# Patient Record
Sex: Female | Born: 1955 | Race: White | Hispanic: No | Marital: Married | Smoking: Former smoker
Health system: Southern US, Community
[De-identification: ages and names within clinical notes are randomized; demographics above are authoritative.]

## PROBLEM LIST (undated history)

## (undated) DIAGNOSIS — K435 Parastomal hernia without obstruction or  gangrene: Secondary | ICD-10-CM

## (undated) DIAGNOSIS — M8580 Other specified disorders of bone density and structure, unspecified site: Secondary | ICD-10-CM

## (undated) DIAGNOSIS — C20 Malignant neoplasm of rectum: Secondary | ICD-10-CM

## (undated) DIAGNOSIS — T2121XA Burn of second degree of chest wall, initial encounter: Secondary | ICD-10-CM

## (undated) DIAGNOSIS — N823 Fistula of vagina to large intestine: Secondary | ICD-10-CM

## (undated) HISTORY — DX: Fistula of vagina to large intestine: N82.3

## (undated) HISTORY — DX: Burn of second degree of chest wall, initial encounter: T21.21XA

## (undated) HISTORY — PX: TUBAL LIGATION: SHX77

## (undated) HISTORY — DX: Malignant neoplasm of rectum: C20

## (undated) HISTORY — DX: Other specified disorders of bone density and structure, unspecified site: M85.80

## (undated) HISTORY — PX: RECTOVAGINAL FISTULA CLOSURE: SUR265

## (undated) HISTORY — DX: Parastomal hernia without obstruction or gangrene: K43.5

---
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
## 1999-07-08 ENCOUNTER — Encounter: Admission: RE | Admit: 1999-07-08 | Discharge: 1999-07-08 | Payer: Self-pay | Admitting: Family Medicine

## 1999-07-08 ENCOUNTER — Encounter: Payer: Self-pay | Admitting: Family Medicine

## 2000-07-16 ENCOUNTER — Encounter: Admission: RE | Admit: 2000-07-16 | Discharge: 2000-07-16 | Payer: Self-pay | Admitting: Family Medicine

## 2000-07-16 ENCOUNTER — Encounter: Payer: Self-pay | Admitting: Family Medicine

## 2001-07-19 ENCOUNTER — Encounter: Admission: RE | Admit: 2001-07-19 | Discharge: 2001-07-19 | Payer: Self-pay | Admitting: Family Medicine

## 2001-07-19 ENCOUNTER — Encounter: Payer: Self-pay | Admitting: Family Medicine

## 2002-07-29 ENCOUNTER — Encounter: Payer: Self-pay | Admitting: Family Medicine

## 2002-07-29 ENCOUNTER — Encounter: Admission: RE | Admit: 2002-07-29 | Discharge: 2002-07-29 | Payer: Self-pay | Admitting: Family Medicine

## 2003-08-17 ENCOUNTER — Encounter: Admission: RE | Admit: 2003-08-17 | Discharge: 2003-08-17 | Payer: Self-pay | Admitting: Family Medicine

## 2004-09-05 ENCOUNTER — Encounter: Admission: RE | Admit: 2004-09-05 | Discharge: 2004-09-05 | Payer: Self-pay | Admitting: Family Medicine

## 2005-09-10 ENCOUNTER — Encounter: Admission: RE | Admit: 2005-09-10 | Discharge: 2005-09-10 | Payer: Self-pay | Admitting: Family Medicine

## 2006-09-14 ENCOUNTER — Encounter: Admission: RE | Admit: 2006-09-14 | Discharge: 2006-09-14 | Payer: Self-pay | Admitting: Family Medicine

## 2006-09-29 ENCOUNTER — Encounter: Admission: RE | Admit: 2006-09-29 | Discharge: 2006-09-29 | Payer: Self-pay | Admitting: Family Medicine

## 2006-11-02 ENCOUNTER — Other Ambulatory Visit: Admission: RE | Admit: 2006-11-02 | Discharge: 2006-11-02 | Payer: Self-pay | Admitting: Family Medicine

## 2007-10-05 ENCOUNTER — Encounter: Admission: RE | Admit: 2007-10-05 | Discharge: 2007-10-05 | Payer: Self-pay | Admitting: Family Medicine

## 2007-11-08 ENCOUNTER — Other Ambulatory Visit: Admission: RE | Admit: 2007-11-08 | Discharge: 2007-11-08 | Payer: Self-pay | Admitting: Family Medicine

## 2008-07-28 HISTORY — PX: COLON RESECTION: SHX5231

## 2008-10-16 ENCOUNTER — Encounter: Admission: RE | Admit: 2008-10-16 | Discharge: 2008-10-16 | Payer: Self-pay | Admitting: Family Medicine

## 2009-02-01 ENCOUNTER — Other Ambulatory Visit: Admission: RE | Admit: 2009-02-01 | Discharge: 2009-02-01 | Payer: Self-pay | Admitting: Family Medicine

## 2009-02-16 ENCOUNTER — Encounter: Admission: RE | Admit: 2009-02-16 | Discharge: 2009-02-16 | Payer: Self-pay | Admitting: Family Medicine

## 2009-03-28 ENCOUNTER — Ambulatory Visit: Payer: Self-pay | Admitting: Internal Medicine

## 2009-04-09 ENCOUNTER — Ambulatory Visit: Payer: Self-pay | Admitting: Internal Medicine

## 2009-04-09 ENCOUNTER — Encounter: Payer: Self-pay | Admitting: Internal Medicine

## 2009-04-09 HISTORY — PX: COLONOSCOPY W/ BIOPSIES AND POLYPECTOMY: SHX1376

## 2009-04-13 ENCOUNTER — Ambulatory Visit: Payer: Self-pay | Admitting: Internal Medicine

## 2009-04-13 DIAGNOSIS — C20 Malignant neoplasm of rectum: Secondary | ICD-10-CM

## 2009-04-13 HISTORY — DX: Malignant neoplasm of rectum: C20

## 2009-04-13 LAB — CONVERTED CEMR LAB
ALT: 18 units/L (ref 0–35)
AST: 23 units/L (ref 0–37)
Albumin: 4.2 g/dL (ref 3.5–5.2)
Alkaline Phosphatase: 75 units/L (ref 39–117)
Basophils Absolute: 0.2 10*3/uL — ABNORMAL HIGH (ref 0.0–0.1)
Calcium: 9.4 mg/dL (ref 8.4–10.5)
Chloride: 107 meq/L (ref 96–112)
Eosinophils Absolute: 0.2 10*3/uL (ref 0.0–0.7)
GFR calc non Af Amer: 93.71 mL/min (ref >60)
HCT: 42.4 % (ref 36.0–46.0)
MCHC: 33.6 g/dL (ref 30.0–36.0)
Monocytes Relative: 7.1 % (ref 3.0–12.0)
Neutro Abs: 3.1 10*3/uL (ref 1.4–7.7)
Platelets: 243 10*3/uL (ref 150.0–400.0)
Sodium: 143 meq/L (ref 135–145)

## 2009-04-23 ENCOUNTER — Ambulatory Visit: Payer: Self-pay | Admitting: Cardiology

## 2009-04-25 ENCOUNTER — Encounter: Payer: Self-pay | Admitting: Internal Medicine

## 2009-04-27 ENCOUNTER — Encounter: Payer: Self-pay | Admitting: Gastroenterology

## 2009-04-27 ENCOUNTER — Telehealth (INDEPENDENT_AMBULATORY_CARE_PROVIDER_SITE_OTHER): Payer: Self-pay | Admitting: *Deleted

## 2009-05-03 ENCOUNTER — Ambulatory Visit: Payer: Self-pay | Admitting: Gastroenterology

## 2009-05-03 ENCOUNTER — Ambulatory Visit (HOSPITAL_COMMUNITY): Admission: RE | Admit: 2009-05-03 | Discharge: 2009-05-03 | Payer: Self-pay | Admitting: Gastroenterology

## 2009-06-11 ENCOUNTER — Inpatient Hospital Stay (HOSPITAL_COMMUNITY): Admission: RE | Admit: 2009-06-11 | Discharge: 2009-06-17 | Payer: Self-pay | Admitting: Surgery

## 2009-06-11 ENCOUNTER — Encounter (INDEPENDENT_AMBULATORY_CARE_PROVIDER_SITE_OTHER): Payer: Self-pay | Admitting: Surgery

## 2009-06-20 ENCOUNTER — Encounter: Payer: Self-pay | Admitting: Internal Medicine

## 2009-07-11 ENCOUNTER — Encounter: Payer: Self-pay | Admitting: Internal Medicine

## 2009-07-28 DIAGNOSIS — Z87442 Personal history of urinary calculi: Secondary | ICD-10-CM

## 2009-07-28 HISTORY — DX: Personal history of urinary calculi: Z87.442

## 2009-07-28 HISTORY — PX: OTHER SURGICAL HISTORY: SHX169

## 2009-08-10 ENCOUNTER — Inpatient Hospital Stay (HOSPITAL_COMMUNITY): Admission: RE | Admit: 2009-08-10 | Discharge: 2009-08-13 | Payer: Self-pay | Admitting: Surgery

## 2009-08-28 ENCOUNTER — Encounter: Payer: Self-pay | Admitting: Internal Medicine

## 2009-09-07 ENCOUNTER — Inpatient Hospital Stay (HOSPITAL_COMMUNITY): Admission: RE | Admit: 2009-09-07 | Discharge: 2009-09-12 | Payer: Self-pay | Admitting: Surgery

## 2009-09-19 ENCOUNTER — Encounter: Payer: Self-pay | Admitting: Internal Medicine

## 2009-10-11 ENCOUNTER — Encounter: Payer: Self-pay | Admitting: Internal Medicine

## 2009-10-22 ENCOUNTER — Encounter: Payer: Self-pay | Admitting: Internal Medicine

## 2009-11-07 ENCOUNTER — Encounter: Payer: Self-pay | Admitting: Internal Medicine

## 2009-11-12 ENCOUNTER — Encounter: Admission: RE | Admit: 2009-11-12 | Discharge: 2009-11-12 | Payer: Self-pay | Admitting: Family Medicine

## 2009-11-28 ENCOUNTER — Encounter: Payer: Self-pay | Admitting: Internal Medicine

## 2009-12-26 ENCOUNTER — Encounter: Payer: Self-pay | Admitting: Internal Medicine

## 2010-01-07 ENCOUNTER — Encounter: Payer: Self-pay | Admitting: Internal Medicine

## 2010-01-07 ENCOUNTER — Encounter: Admission: RE | Admit: 2010-01-07 | Discharge: 2010-01-07 | Payer: Self-pay | Admitting: Dermatology

## 2010-01-23 ENCOUNTER — Encounter: Payer: Self-pay | Admitting: Internal Medicine

## 2010-02-06 ENCOUNTER — Encounter: Payer: Self-pay | Admitting: Internal Medicine

## 2010-02-23 ENCOUNTER — Inpatient Hospital Stay (HOSPITAL_COMMUNITY): Admission: AD | Admit: 2010-02-23 | Discharge: 2010-02-24 | Payer: Self-pay | Admitting: Surgery

## 2010-03-08 ENCOUNTER — Encounter: Payer: Self-pay | Admitting: Internal Medicine

## 2010-03-10 ENCOUNTER — Telehealth: Payer: Self-pay | Admitting: Internal Medicine

## 2010-03-11 ENCOUNTER — Encounter (INDEPENDENT_AMBULATORY_CARE_PROVIDER_SITE_OTHER): Payer: Self-pay | Admitting: *Deleted

## 2010-03-25 ENCOUNTER — Encounter: Payer: Self-pay | Admitting: Internal Medicine

## 2010-04-10 ENCOUNTER — Encounter: Payer: Self-pay | Admitting: Internal Medicine

## 2010-04-22 ENCOUNTER — Ambulatory Visit (HOSPITAL_COMMUNITY): Admission: RE | Admit: 2010-04-22 | Discharge: 2010-04-24 | Payer: Self-pay | Admitting: Surgery

## 2010-05-02 ENCOUNTER — Encounter: Payer: Self-pay | Admitting: Internal Medicine

## 2010-05-15 ENCOUNTER — Encounter: Payer: Self-pay | Admitting: Internal Medicine

## 2010-06-05 ENCOUNTER — Encounter: Payer: Self-pay | Admitting: Internal Medicine

## 2010-06-26 ENCOUNTER — Encounter: Payer: Self-pay | Admitting: Internal Medicine

## 2010-07-11 ENCOUNTER — Encounter: Payer: Self-pay | Admitting: Internal Medicine

## 2010-07-11 ENCOUNTER — Ambulatory Visit (HOSPITAL_COMMUNITY)
Admission: RE | Admit: 2010-07-11 | Discharge: 2010-07-11 | Payer: Self-pay | Source: Home / Self Care | Attending: Surgery | Admitting: Surgery

## 2010-07-19 ENCOUNTER — Ambulatory Visit (HOSPITAL_COMMUNITY)
Admission: RE | Admit: 2010-07-19 | Discharge: 2010-07-20 | Payer: Self-pay | Source: Home / Self Care | Attending: Surgery | Admitting: Surgery

## 2010-08-01 ENCOUNTER — Encounter: Payer: Self-pay | Admitting: Internal Medicine

## 2010-08-18 ENCOUNTER — Encounter: Payer: Self-pay | Admitting: Family Medicine

## 2010-08-19 ENCOUNTER — Telehealth: Payer: Self-pay | Admitting: Internal Medicine

## 2010-08-20 ENCOUNTER — Encounter: Payer: Self-pay | Admitting: Internal Medicine

## 2010-08-26 ENCOUNTER — Encounter: Payer: Self-pay | Admitting: Internal Medicine

## 2010-08-29 NOTE — Letter (Signed)
Summary: [facility]   Imported By: Lester [place] 01/29/2010 12:14:49  _____________________________________________________________________  External Attachment:    Type:   Image     Comment:   External Document

## 2010-08-29 NOTE — Letter (Signed)
Summary: [facility]   Imported By: Lester [place] 01/29/2010 12:15:57  _____________________________________________________________________  External Attachment:    Type:   Image     Comment:   External Document

## 2010-08-29 NOTE — Progress Notes (Signed)
Summary: colon recall now April 2012  Phone Note Outgoing Call   Call placed by: Iva Boop MD, Clementeen Graham,  August 19, 2010 4:57 PM Summary of Call: I discussed repeat colonoscopy with Dr. 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 will change recall to april 2012      Procedures Next Due Date:    Colonoscopy: 10/2010

## 2010-08-29 NOTE — Letter (Signed)
Summary: [facility]   Imported By: Sherian Rein 12/05/2009 10:58:08  _____________________________________________________________________  External Attachment:    Type:   Image     Comment:   External Document

## 2010-08-29 NOTE — Letter (Signed)
Summary: Colonoscopy Date Change Letter  [facility]  [address] [place], [place] [address]   Phone: [phone]  Fax: [phone]      March 11, 2010 MRN: [id number]   [facility] [address] [place], [place]  [address]   Dear Emily Holloway,   Previously you were recommended to have a repeat colonoscopy around this time. Your chart was recently reviewed by Dr. Leone Payor of [facility]. Follow up colonoscopy is now recommended in January 2012. This revised recommendation is based on current, nationally recognized guidelines for colorectal cancer screening and polyp surveillance. These guidelines are endorsed by the American Cancer Society, [employer] on Colorectal Cancer as well as numerous other major medical organizations.  Please understand that our recommendation assumes that you do not have any new symptoms such as bleeding, a change in bowel habits, anemia, or significant abdominal discomfort. If you do have any concerning GI symptoms or want to discuss the guideline recommendations, please call to arrange an office visit at your earliest convenience. Otherwise we will keep you in our reminder system and contact you 1-2 months prior to the date listed above to schedule your next colonoscopy.  Thank you,   Iva Boop, M.D.  [facility] [phone]

## 2010-08-29 NOTE — Letter (Signed)
Summary: [facility]   Imported By: Lester [place] 10/31/2009 08:59:32  _____________________________________________________________________  External Attachment:    Type:   Image     Comment:   External Document

## 2010-08-29 NOTE — Letter (Signed)
Summary: [facility]   Imported By: Sherian Rein 05/15/2010 11:35:25  _____________________________________________________________________  External Attachment:    Type:   Image     Comment:   External Document

## 2010-08-29 NOTE — Op Note (Signed)
Summary: [facility]   Imported By: Lester [place] 10/23/2009 10:53:04  _____________________________________________________________________  External Attachment:    Type:   Image     Comment:   External Document

## 2010-08-29 NOTE — Procedures (Signed)
Summary: Recall Assessment/[facility]  Recall Assessment/[facility]   Imported By: Sherian Rein 08/23/2010 08:21:33  _____________________________________________________________________  External Attachment:    Type:   Image     Comment:   External Document

## 2010-08-29 NOTE — Letter (Signed)
Summary: [facility]   Imported By: Sherian Rein 02/04/2010 10:30:14  _____________________________________________________________________  External Attachment:    Type:   Image     Comment:   External Document

## 2010-08-29 NOTE — Letter (Signed)
Summary: [facility]   Imported By: Lester [place] 04/04/2010 10:49:41  _____________________________________________________________________  External Attachment:    Type:   Image     Comment:   External Document

## 2010-08-29 NOTE — Letter (Signed)
Summary: Colonoscopy Date Change Letter  [facility]  [address]   [place], [place] [address]   Phone: [phone]  Fax: [phone]      August 20, 2010 MRN: [id number]   [facility] [address] [place], [place]  [address]   Dear [name],   Previously you were recommended to have a repeat colonoscopy around this time. Your chart was recently reviewed by Dr.Gessner of [facility]. Follow up colonoscopy is now recommended in April 2012. This revised recommendation is based on current, nationally recognized guidelines for colorectal cancer screening and polyp surveillance. These guidelines are endorsed by the American Cancer Society, [employer] on Colorectal Cancer as well as numerous other major medical organizations.  Please understand that our recommendation assumes that you do not have any new symptoms such as bleeding, a change in bowel habits, anemia, or significant abdominal discomfort. If you do have any concerning GI symptoms or want to discuss the guideline recommendations, please call to arrange an office visit at your earliest convenience. Otherwise we will keep you in our reminder system and contact you 1-2 months prior to the date listed above to schedule your next colonoscopy.  Thank you,   Stan Head, M.D.  [facility]astroenterology Division [phone]

## 2010-08-29 NOTE — Progress Notes (Signed)
Summary: ? to Dr. Michaell Cowing: When is it ok to repeat Colonoscopy?  Phone Note Outgoing Call   Summary of Call: Comming due for 1 year colonoscopy after rectal cancer resection. Has had subsequent surgery. Please leave message for Dr. Michaell Cowing re: When does he think she would be ready for this? I am changing her to plan for about January 2012. Iva Boop MD, [facility]  March 10, 2010 12:56 PM   Follow-up for Phone Call        I left a message w/Dr.Gross's CMA, Elease Hashimoto, she will callback with his recommendation. Follow-up by: Laureen Ochs LPN,  March 11, 2010 10:33 AM  Additional Follow-up for Phone Call Additional follow up Details #1::        I faxed a copy of this note to Dr.Gross/Alisha, requesting review and advisement, I will wait for response. Laureen Ochs LPN  March 12, 2010 9:05 AM     Additional Follow-up for Phone Call Additional follow up Details #2::    Per Alisha/Dr.Gross--Dr.Gross doesn't want her to have a Colonoscopy until right before her take-down surgery, at least 3-6 monthes from now.  Dr.Gross's office will call to coordinate Colonoscopy/surgery, so pt. only needs to be prepped X1.      Follow-up by: Laureen Ochs LPN,  March 12, 2010 3:42 PM  Additional Follow-up for Phone Call Additional follow up Details #3:: Details for Additional Follow-up Action Taken: ok will still leave Jan recall as safety net Additional Follow-up by: Iva Boop MD, Clementeen Graham,  March 15, 2010 7:55 AM   Appended Document: ? to Dr. Michaell Cowing: When is it ok to repeat Colonoscopy? Recall is in IDX for 07/2010.

## 2010-08-29 NOTE — Letter (Signed)
Summary: [facility]   Imported By: Sherian Rein 07/30/2010 10:56:11  _____________________________________________________________________  External Attachment:    Type:   Image     Comment:   External Document

## 2010-08-29 NOTE — Letter (Signed)
Summary: [facility]   Imported By: Lester [place] 03/22/2010 10:16:15  _____________________________________________________________________  External Attachment:    Type:   Image     Comment:   External Document

## 2010-08-29 NOTE — Letter (Signed)
Summary: [facility]   Imported By: Sherian Rein 08/09/2009 08:07:00  _____________________________________________________________________  External Attachment:    Type:   Image     Comment:   External Document

## 2010-08-29 NOTE — Letter (Signed)
Summary: [facility]   Imported By: Sherian Rein 06/26/2010 10:48:55  _____________________________________________________________________  External Attachment:    Type:   Image     Comment:   External Document

## 2010-08-29 NOTE — Letter (Signed)
Summary: [facility]   Imported By: Lester [place] 12/17/2009 10:02:30  _____________________________________________________________________  External Attachment:    Type:   Image     Comment:   External Document

## 2010-08-29 NOTE — Letter (Signed)
Summary: [facility]   Imported By: Lester [place] 07/16/2010 12:01:05  _____________________________________________________________________  External Attachment:    Type:   Image     Comment:   External Document

## 2010-08-29 NOTE — Letter (Signed)
Summary: [facility]   Imported By: Lester [place] 10/23/2009 10:54:43  _____________________________________________________________________  External Attachment:    Type:   Image     Comment:   External Document

## 2010-08-29 NOTE — Letter (Signed)
Summary: [facility]   Imported By: Sherian Rein 06/06/2010 10:03:38  _____________________________________________________________________  External Attachment:    Type:   Image     Comment:   External Document

## 2010-08-29 NOTE — Letter (Signed)
Summary: [facility]   Imported By: Sherian Rein 05/15/2010 11:36:30  _____________________________________________________________________  External Attachment:    Type:   Image     Comment:   External Document

## 2010-08-29 NOTE — Letter (Signed)
Summary: [facility]   Imported By: Sherian Rein 09/12/2009 07:41:40  _____________________________________________________________________  External Attachment:    Type:   Image     Comment:   External Document

## 2010-08-29 NOTE — Letter (Signed)
Summary: [facility]   Imported By: Sherian Rein 02/22/2010 13:44:45  _____________________________________________________________________  External Attachment:    Type:   Image     Comment:   External Document

## 2010-09-12 NOTE — Letter (Signed)
Summary: [facility]   Imported By: Lennie Odor 09/05/2010 15:30:05  _____________________________________________________________________  External Attachment:    Type:   Image     Comment:   External Document

## 2010-09-19 ENCOUNTER — Encounter: Payer: Self-pay | Admitting: Internal Medicine

## 2010-10-07 LAB — CBC
HCT: 33.8 % — ABNORMAL LOW (ref 36.0–46.0)
MCH: 28.7 pg (ref 26.0–34.0)
MCHC: 31.4 g/dL (ref 30.0–36.0)
Platelets: 564 10*3/uL — ABNORMAL HIGH (ref 150–400)

## 2010-10-07 LAB — SURGICAL PCR SCREEN: MRSA, PCR: NEGATIVE

## 2010-10-08 NOTE — Letter (Signed)
Summary: [facility]   Imported By: Sherian Rein 10/03/2010 11:23:49  _____________________________________________________________________  External Attachment:    Type:   Image     Comment:   External Document

## 2010-10-10 LAB — CBC
MCH: 30.3 pg (ref 26.0–34.0)
MCHC: 33.4 g/dL (ref 30.0–36.0)
Platelets: 324 10*3/uL (ref 150–400)
RDW: 15.5 % (ref 11.5–15.5)

## 2010-10-10 LAB — CULTURE, ROUTINE-ABSCESS

## 2010-10-10 LAB — ANAEROBIC CULTURE

## 2010-10-10 LAB — COMPREHENSIVE METABOLIC PANEL
CO2: 29 mEq/L (ref 19–32)
Chloride: 100 mEq/L (ref 96–112)
Creatinine, Ser: 0.58 mg/dL (ref 0.4–1.2)
GFR calc non Af Amer: >60 mL/min (ref >60)
Potassium: 4.1 mEq/L (ref 3.5–5.1)
Sodium: 136 mEq/L (ref 135–145)

## 2010-10-12 LAB — CBC: RDW: 14.6 % (ref 11.5–15.5)

## 2010-10-12 LAB — SURGICAL PCR SCREEN: Staphylococcus aureus: NEGATIVE

## 2010-10-13 LAB — CBC
HCT: 33.7 % — ABNORMAL LOW (ref 36.0–46.0)
Hemoglobin: 11.1 g/dL — ABNORMAL LOW (ref 12.0–15.0)
MCHC: 32.8 g/dL (ref 30.0–36.0)
MCV: 92.2 fL (ref 78.0–100.0)
RDW: 14.8 % (ref 11.5–15.5)
WBC: 6.5 10*3/uL (ref 4.0–10.5)

## 2010-10-16 LAB — CBC
HCT: 24.6 % — ABNORMAL LOW (ref 36.0–46.0)
HCT: 30.9 % — ABNORMAL LOW (ref 36.0–46.0)
Hemoglobin: 10 g/dL — ABNORMAL LOW (ref 12.0–15.0)
Hemoglobin: 10.9 g/dL — ABNORMAL LOW (ref 12.0–15.0)
Hemoglobin: 12 g/dL (ref 12.0–15.0)
Hemoglobin: 8.2 g/dL — ABNORMAL LOW (ref 12.0–15.0)
MCHC: 32.9 g/dL (ref 30.0–36.0)
MCHC: 33.2 g/dL (ref 30.0–36.0)
MCHC: 33.2 g/dL (ref 30.0–36.0)
MCV: 89.3 fL (ref 78.0–100.0)
Platelets: 341 10*3/uL (ref 150–400)
Platelets: 349 10*3/uL (ref 150–400)
Platelets: 359 10*3/uL (ref 150–400)
RBC: 3.66 MIL/uL — ABNORMAL LOW (ref 3.87–5.11)
RDW: 15.2 % (ref 11.5–15.5)
RDW: 15.8 % — ABNORMAL HIGH (ref 11.5–15.5)
RDW: 15.8 % — ABNORMAL HIGH (ref 11.5–15.5)
RDW: 15.8 % — ABNORMAL HIGH (ref 11.5–15.5)
RDW: 15.8 % — ABNORMAL HIGH (ref 11.5–15.5)
WBC: 14.2 10*3/uL — ABNORMAL HIGH (ref 4.0–10.5)
WBC: 19.6 10*3/uL — ABNORMAL HIGH (ref 4.0–10.5)
WBC: 5.6 10*3/uL (ref 4.0–10.5)

## 2010-10-16 LAB — BASIC METABOLIC PANEL
BUN: 8 mg/dL (ref 6–23)
BUN: 9 mg/dL (ref 6–23)
CO2: 29 mEq/L (ref 19–32)
Calcium: 8.3 mg/dL — ABNORMAL LOW (ref 8.4–10.5)
Calcium: 8.3 mg/dL — ABNORMAL LOW (ref 8.4–10.5)
Chloride: 103 mEq/L (ref 96–112)
GFR calc non Af Amer: 44 mL/min — ABNORMAL LOW (ref >60)
GFR calc non Af Amer: 49 mL/min — ABNORMAL LOW (ref >60)
GFR calc non Af Amer: 55 mL/min — ABNORMAL LOW (ref >60)
Glucose, Bld: 142 mg/dL — ABNORMAL HIGH (ref 70–99)
Glucose, Bld: 145 mg/dL — ABNORMAL HIGH (ref 70–99)
Potassium: 4 mEq/L (ref 3.5–5.1)
Sodium: 134 mEq/L — ABNORMAL LOW (ref 135–145)
Sodium: 136 mEq/L (ref 135–145)
Sodium: 139 mEq/L (ref 135–145)

## 2010-10-16 LAB — CREATININE, SERUM
Creatinine, Ser: 0.61 mg/dL (ref 0.4–1.2)
GFR calc Af Amer: >60 mL/min (ref >60)
GFR calc non Af Amer: >60 mL/min (ref >60)

## 2010-10-16 LAB — ANAEROBIC CULTURE

## 2010-10-16 LAB — TISSUE CULTURE: Culture: NO GROWTH

## 2010-10-30 LAB — COMPREHENSIVE METABOLIC PANEL
ALT: 21 U/L (ref 0–35)
AST: 21 U/L (ref 0–37)
CO2: 32 mEq/L (ref 19–32)
Chloride: 103 mEq/L (ref 96–112)
Creatinine, Ser: 0.63 mg/dL (ref 0.4–1.2)
GFR calc Af Amer: >60 mL/min (ref >60)
GFR calc non Af Amer: >60 mL/min (ref >60)
Total Bilirubin: 0.5 mg/dL (ref 0.3–1.2)

## 2010-10-30 LAB — CBC
HCT: 29.8 % — ABNORMAL LOW (ref 36.0–46.0)
Hemoglobin: 10.1 g/dL — ABNORMAL LOW (ref 12.0–15.0)
Hemoglobin: 13.5 g/dL (ref 12.0–15.0)
MCHC: 33.7 g/dL (ref 30.0–36.0)
MCV: 96.3 fL (ref 78.0–100.0)
MCV: 97.1 fL (ref 78.0–100.0)
RBC: 3.1 MIL/uL — ABNORMAL LOW (ref 3.87–5.11)
RBC: 4.09 MIL/uL (ref 3.87–5.11)
WBC: 8 10*3/uL (ref 4.0–10.5)

## 2010-10-30 LAB — CREATININE, SERUM: Creatinine, Ser: 0.65 mg/dL (ref 0.4–1.2)

## 2010-11-25 ENCOUNTER — Other Ambulatory Visit: Payer: Self-pay | Admitting: Surgery

## 2010-11-25 ENCOUNTER — Encounter (HOSPITAL_COMMUNITY): Payer: 59

## 2010-11-25 ENCOUNTER — Other Ambulatory Visit (HOSPITAL_COMMUNITY): Payer: 59

## 2010-11-25 LAB — CBC
Hemoglobin: 13 g/dL (ref 12.0–15.0)
MCHC: 32.5 g/dL (ref 30.0–36.0)
RDW: 14.5 % (ref 11.5–15.5)

## 2010-11-25 LAB — SURGICAL PCR SCREEN
MRSA, PCR: NEGATIVE
Staphylococcus aureus: NEGATIVE

## 2010-11-25 LAB — BASIC METABOLIC PANEL
Calcium: 9.5 mg/dL (ref 8.4–10.5)
GFR calc Af Amer: >60 mL/min (ref >60)
GFR calc non Af Amer: >60 mL/min (ref >60)
Sodium: 142 mEq/L (ref 135–145)

## 2010-11-29 ENCOUNTER — Ambulatory Visit (HOSPITAL_COMMUNITY)
Admission: RE | Admit: 2010-11-29 | Discharge: 2010-11-29 | Disposition: A | Discharge status: Home / Self Care | Payer: 59 | Source: Ambulatory Visit | Attending: Surgery | Admitting: Surgery

## 2010-11-29 ENCOUNTER — Other Ambulatory Visit: Payer: Self-pay | Admitting: Surgery

## 2010-11-29 DIAGNOSIS — Z01812 Encounter for preprocedural laboratory examination: Secondary | ICD-10-CM | POA: Insufficient documentation

## 2010-11-29 DIAGNOSIS — K620 Anal polyp: Secondary | ICD-10-CM | POA: Insufficient documentation

## 2010-11-29 DIAGNOSIS — Z01818 Encounter for other preprocedural examination: Secondary | ICD-10-CM | POA: Insufficient documentation

## 2010-11-29 DIAGNOSIS — C20 Malignant neoplasm of rectum: Secondary | ICD-10-CM | POA: Insufficient documentation

## 2010-11-29 DIAGNOSIS — N824 Other female intestinal-genital tract fistulae: Secondary | ICD-10-CM | POA: Insufficient documentation

## 2010-11-29 DIAGNOSIS — Z79899 Other long term (current) drug therapy: Secondary | ICD-10-CM | POA: Insufficient documentation

## 2010-12-04 ENCOUNTER — Encounter (INDEPENDENT_AMBULATORY_CARE_PROVIDER_SITE_OTHER): Payer: Self-pay | Admitting: Surgery

## 2010-12-09 ENCOUNTER — Other Ambulatory Visit: Payer: Self-pay | Admitting: Family Medicine

## 2010-12-09 DIAGNOSIS — Z1231 Encounter for screening mammogram for malignant neoplasm of breast: Secondary | ICD-10-CM

## 2010-12-10 NOTE — Op Note (Signed)
NAME:  Emily Holloway, Emily Holloway            ACCOUNT NO.:  [id number]  MEDICAL RECORD NO.:  [id number]           PATIENT TYPE:  O  LOCATION:  DAYL                         FACILITY:  [facility]  PHYSICIAN:  Ardeth Sportsman, MD     DATE OF BIRTH:  [birth year]  DATE OF PROCEDURE: DATE OF DISCHARGE:  11/29/2010                              OPERATIVE REPORT   PRIMARY CARE PHYSICIAN:  Dario Guardian, M.D.  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  POSTOPERATIVE DIAGNOSES: 1. Small recurrent distal rectovaginal fistula. 2. Rectal polyp x2.  PROCEDURES PERFORMED: 1. Examination under anesthesia. 2. Removal of rectal tube. 3. Transanal excision of rectal polyp x2. 4. Endorectal advancement flap closure of recurrent rectovaginal     fistula.  ANESTHESIA: 1. General anesthesia. 2. Anorectal block with local anesthetic.  SPECIMENS: 1. Anterior rectal polyp (5 cm anterior to the anus). 2. Posterior rectal polyp (6 cm from the anal verge). 3. Rectal mucosa of recurrent rectovaginal fistula.  DRAINS:  None.  ESTIMATED BLOOD LOSS:  50 mL.  COMPLICATIONS:  None apparent.  INDICATIONS:  Emily Holloway is a 54 year old female with a T2 N0 rectal cancer, status post low anterior resection with very low resection with colonic J-pouch and coloanal anastomosis integrating loop ileostomy. She has been cancer-free.  She developed a rectovaginal fistula.  This was repaired, it fell down, and had to be re-repaired.  It slowly had been closing down and improving.  On examination, I thought it was fully closed.  Therefore, I recommended examination, and removal of the rectal tube.  Technique, risks, benefits, and alternatives were discussed. Questions answered and she agreed to proceed.  This morning, she noted she started to getting some vaginal drainage, began in the past 2 days.  OPERATIVE  FINDINGS:  She had a recurrent rectovaginal fistula in the anterior midline.  It was 3 mm in size.  There were no obvious masses or tumors associated with the area.  There were a few small staples from the colonic J-pouch in the wound suspicious for chronic foreign body reaction.  Next, she had 2 rectal polyps in the rectal vault, 1 in anterior midline, and 1 in posterior midline.  They were less than 1 cm in size and very pedunculated and looked adenomatous versus reactionary to the chronic rectal tube.  Her rectal mucosa had a 1 cm wound with small pinhole granulation tissue of the fistula.  DESCRIPTION OF PROCEDURE:  Informed consent was confirmed.  The patient received IV antibiotics.  She was positioned in high lithotomy.  Her perineum and vagina were prepped and draped in sterile fashion. Surgical time-out confirmed our plan.  I went ahead and removed the rectal tube after the cutting the side stitch.  I did inspection of the vagina and I could see a small pinhole granulation tissue that was suspicious for a fistula.  I could feel finger and feel the dimpling on the anterior rectum and was suspicious for defect.  I completed a probe and fitted in there.  It measured around  3 cm.  When I palpated, I thought I found a few rectal masses.  Anoscopy confirmed 2 pedunculated rectal polyps as noted above.  I went ahead and transected those using cautery to remove them off the mucosa.  I closed this with 2-0 Vicryl interrupted stitches for good hemostasis.  In doing inspection, I could not see the rectal end of the rectovaginal fistula, as there was significant dimpling there.  Therefore, we repositioned the patient prone.  At the reexamination, I could see dimpling of the rectal side.  On examining that wound, I actually saw 2 small staples that would have been from the GIA stapling of the colonic J-pouch since it was a hand-sewn anastomosis.  They were sitting in there.  I wonder  if that was a chronic foreign body reaction. I kept this from closing fully.  I went ahead and trimmed around the rectal mucosa bed a few millimeters and removed that and trimmed that off.  Based on the persistent opening there, I decided to do an endorectal advancement flap.  Therefore, at the level of the anoderm, I created a flap about 2.5 cm wide, a little more in the core of circumference and transected at the level of the anoderm and started elevating a flap and getting out.  I got across the fistula and got into the neorectum and freed the neorectum off the rectovaginal septum.  I transected to make a classic flap door by creating the flap anteriorly going up 5 cm and freeing it and appeared there was some adherence to the rectovaginal septum given the fact that the prior biological measurement, but I was able to get a pretty good plane about 1 cm proximal to rectovaginal fistula was much easier.  There was good healthy blood supply.  I ensured hemostasis.  I could bring the flap down and it laid well at the anal verge without tension, so I felt I had adequate mobilization.  I therefore advanced the advancement flap and secured it to the anoderm using interrupted 2-0 Vicryl stitches with a few small 3-0 Vicryl stitches in between.  I then did 3-0 Vicryl stitches on the lateral ends of the flap to help retack the flap laterally as well.  I did close the rectovaginal septum and fistula using 2-0 Vicryl interrupted stitches x2 and left that.  I left the vaginal side alone.  On examination of the advancement flaps, the distal anastomosis of the advancement flap was 1.5 distal to where rectovaginal hole was.  There was a little mild __________ most part had healthy bleeding tissue and was quite viable.  I did copious irrigation and had good hemostasis. I felt no hematoma in the rectovaginal septum.  Given the fact that she had some reactionary problems with rectal tube, I decided  not to leave her rectal tube at this time.  The patient was extubated and taken to the recovery room in stable condition.  We will monitor for adequate pain control and follow up pathology.  I discussed the findings with the patient's husband.     Ardeth Sportsman, MD     SCG/MEDQ  D:  11/29/2010  T:  11/30/2010  Job:  [id number]  cc:   Dario Guardian, M.D. Fax: [phone]  Electronically Signed by Karie Soda MD on 12/10/2010 12:07:03 PM

## 2010-12-16 ENCOUNTER — Ambulatory Visit
Admission: RE | Admit: 2010-12-16 | Discharge: 2010-12-16 | Disposition: A | Discharge status: Home / Self Care | Payer: 59 | Source: Ambulatory Visit | Attending: Family Medicine | Admitting: Family Medicine

## 2010-12-16 DIAGNOSIS — Z1231 Encounter for screening mammogram for malignant neoplasm of breast: Secondary | ICD-10-CM

## 2010-12-24 ENCOUNTER — Encounter: Payer: Self-pay | Admitting: Internal Medicine

## 2011-01-31 ENCOUNTER — Encounter (INDEPENDENT_AMBULATORY_CARE_PROVIDER_SITE_OTHER): Payer: Self-pay | Admitting: Surgery

## 2011-02-03 ENCOUNTER — Ambulatory Visit (INDEPENDENT_AMBULATORY_CARE_PROVIDER_SITE_OTHER): Payer: 59 | Admitting: Surgery

## 2011-02-03 ENCOUNTER — Encounter (INDEPENDENT_AMBULATORY_CARE_PROVIDER_SITE_OTHER): Payer: Self-pay | Admitting: Surgery

## 2011-02-03 DIAGNOSIS — N824 Other female intestinal-genital tract fistulae: Secondary | ICD-10-CM

## 2011-02-03 DIAGNOSIS — N823 Fistula of vagina to large intestine: Secondary | ICD-10-CM

## 2011-02-03 NOTE — Progress Notes (Signed)
Subjective:     Patient ID: Emily Holloway, female   DOB: [birth year], 55 y.o.   MRN: [id number]    There were no vitals taken for this visit.    HPI  Diagnosis T2 N0 stage I rectal cancer status post very low anterior resection colonic J. pouch and loop ileostomy  Recurrent rectovaginal fistula status post repair most recently in Nov 29, 2010  Reason for visit is polyp  [name] comes in today feeling alright. She has a thin serous drainage from the vagina.  A foldable toilet paper controls it easily once a day. About twice a week she will notice a little bit of yellow thicker mucousy drainage with little bit of blood. He does not have classic rectal drainage. She suspects that there is still a hole there.  She occasionally gets some rectal drainage but not severe. She says her energy is fine. She's working in was doing well. She denies any severe pain.  Review of Systems  Constitutional: Negative for fever, chills, diaphoresis, appetite change and fatigue.  HENT: Negative for nosebleeds, sore throat, mouth sores, neck pain and neck stiffness.   Eyes: Negative for photophobia, discharge and visual disturbance.  Respiratory: Negative for cough, choking, chest tightness and shortness of breath.   Cardiovascular: Negative for chest pain and palpitations.  Gastrointestinal: Negative for nausea, vomiting, abdominal pain, diarrhea, constipation, blood in stool, abdominal distention, anal bleeding and rectal pain.  Genitourinary: Positive for vaginal discharge. Negative for dysuria, urgency, frequency, flank pain, vaginal bleeding, difficulty urinating, genital sores, menstrual problem and pelvic pain.       As per HPI  Musculoskeletal: Negative for back pain, arthralgias and gait problem.  Skin: Negative for color change, pallor and rash.  Neurological: Negative for dizziness, speech difficulty, weakness and numbness.  Hematological: Negative for adenopathy. Does not bruise/bleed easily.   Psychiatric/Behavioral: Negative for confusion and agitation. The patient is not nervous/anxious.        Objective:   Physical Exam  Constitutional: She is oriented to person, place, and time. She appears well-developed and well-nourished. No distress.  HENT:  Head: Normocephalic.  Mouth/Throat: Oropharynx is clear and moist. No oropharyngeal exudate.  Eyes: Conjunctivae and EOM are normal. Pupils are equal, round, and reactive to light. No scleral icterus.  Neck: Normal range of motion. Neck supple. No tracheal deviation present.  Cardiovascular: Normal rate, regular rhythm and intact distal pulses.   Pulmonary/Chest: Effort normal and breath sounds normal. No respiratory distress. She exhibits no tenderness.  Abdominal: Soft. She exhibits no distension and no mass. There is no tenderness. Hernia confirmed negative in the right inguinal area and confirmed negative in the left inguinal area.  Genitourinary: Vaginal discharge found.       Post midline vaginal wound at introitus nearly closed. <1cm mostly epithelialized crater with punctate yellow/red wound.  No fluctulence/pain.  Musculoskeletal: Normal range of motion. She exhibits no tenderness.  Lymphadenopathy:    She has no cervical adenopathy.       Right: No inguinal adenopathy present.       Left: No inguinal adenopathy present.  Neurological: She is alert and oriented to person, place, and time. No cranial nerve deficit. She exhibits normal muscle tone. Coordination normal.  Skin: Skin is warm and dry. No rash noted. She is not diaphoretic. No erythema.  Psychiatric: She has a normal mood and affect. Her behavior is normal. Judgment and thought content normal.       Assessment:     Recurrent  rectovaginal fistula improved after rectal advancement flap 2 months postop    Plan:     Continued care  Return to clinic on months  I think it is nearly closed down. There's just a punctate opening. The drainage is volume is  markedly going down. I do not think anything that she has recurrent fistula this time. See her in about a month. At that point considering her rectal examination of major things closer. At that point also consider a colonoscopy and an ileostomy takedown few months after that. She and I feel encouraged.

## 2011-03-18 ENCOUNTER — Encounter (INDEPENDENT_AMBULATORY_CARE_PROVIDER_SITE_OTHER): Payer: 59 | Admitting: Surgery

## 2011-03-27 ENCOUNTER — Ambulatory Visit (INDEPENDENT_AMBULATORY_CARE_PROVIDER_SITE_OTHER): Payer: 59 | Admitting: Surgery

## 2011-03-27 ENCOUNTER — Encounter (INDEPENDENT_AMBULATORY_CARE_PROVIDER_SITE_OTHER): Payer: Self-pay | Admitting: Surgery

## 2011-03-27 VITALS — BP 110/76 | HR 80 | Temp 97.2°F | Ht 63.0 in | Wt 162.8 lb

## 2011-03-27 DIAGNOSIS — N824 Other female intestinal-genital tract fistulae: Secondary | ICD-10-CM

## 2011-03-27 DIAGNOSIS — N823 Fistula of vagina to large intestine: Secondary | ICD-10-CM

## 2011-03-27 DIAGNOSIS — Z85048 Personal history of other malignant neoplasm of rectum, rectosigmoid junction, and anus: Secondary | ICD-10-CM

## 2011-03-27 NOTE — Progress Notes (Signed)
Subjective:     Patient ID: Emily Holloway, female   DOB: 04-13-1956, 55 y.o.   MRN: [id number]    BP 110/76  Pulse 80  Temp(Src) 97.2 F (36.2 C) (Temporal)  Ht 5\' 3"  (1.6 m)  Wt 162 lb 12.8 oz (73.846 kg)  BMI 28.84 kg/m2    HPI   Diagnosis T2 N0 stage I rectal cancer status post very low anterior resection, colonic J. pouch and loop ileostomy  Recurrent rectovaginal fistula status post repair most recently in Nov 29, 2010  Reason for visit: Followup  Emily Holloway comes in today feeling better. She has a normal scant drainage from the vagina for at least the past 2 weeks..  She has mild mucus rectal drainage. She suspects that there is still a hole there. Her energy is better.  Work is going well. Gaining a little weight.  She denies any severe pain.  No ileostomy problems.  Review of Systems  Constitutional: Negative for fever, chills, diaphoresis, appetite change and fatigue.  HENT: Negative for nosebleeds, sore throat, mouth sores, neck pain and neck stiffness.   Eyes: Negative for photophobia, discharge and visual disturbance.  Respiratory: Negative for cough, choking, chest tightness and shortness of breath.   Cardiovascular: Negative for chest pain and palpitations.  Gastrointestinal: Negative for nausea, vomiting, abdominal pain, diarrhea, constipation, blood in stool, abdominal distention, anal bleeding and rectal pain.  Genitourinary: Negative for dysuria, urgency, frequency, flank pain, vaginal bleeding, vaginal discharge, difficulty urinating, genital sores, vaginal pain, menstrual problem and pelvic pain.       As per HPI  Musculoskeletal: Negative for back pain, arthralgias and gait problem.  Skin: Negative for color change, pallor and rash.  Neurological: Negative for dizziness, speech difficulty, weakness and numbness.  Hematological: Negative for adenopathy. Does not bruise/bleed easily.  Psychiatric/Behavioral: Negative for confusion and agitation. The patient  is not nervous/anxious.        Objective:   Physical Exam  Constitutional: She is oriented to person, place, and time. She appears well-developed and well-nourished. No distress.  HENT:  Head: Normocephalic.  Mouth/Throat: Oropharynx is clear and moist. No oropharyngeal exudate.  Eyes: Conjunctivae and EOM are normal. Pupils are equal, round, and reactive to light. No scleral icterus.  Neck: Normal range of motion. Neck supple. No tracheal deviation present.  Cardiovascular: Normal rate, regular rhythm and intact distal pulses.   Pulmonary/Chest: Effort normal and breath sounds normal. No respiratory distress. She exhibits no tenderness.  Abdominal: Soft. She exhibits no distension and no mass. There is no tenderness. Hernia confirmed negative in the right inguinal area and confirmed negative in the left inguinal area.  Genitourinary: No vaginal discharge found.       Post midline vaginal wound at introitus 2x5 mm crater with punctate yellow/red wound.  No fistula.  No fluctulence/pain.  Rectal: NST, sensitive but mild rectal stricturing - index finger easily passes through.  No expression of fluid out vagina wound with rectal exam  Musculoskeletal: Normal range of motion. She exhibits no tenderness.  Lymphadenopathy:    She has no cervical adenopathy.       Right: No inguinal adenopathy present.       Left: No inguinal adenopathy present.  Neurological: She is alert and oriented to person, place, and time. No cranial nerve deficit. She exhibits normal muscle tone. Coordination normal.  Skin: Skin is warm and dry. No rash noted. She is not diaphoretic. No erythema.  Psychiatric: She has a normal mood and affect. Her  behavior is normal. Judgment and thought content normal.       Assessment:     Recurrent rectovaginal fistula improved after rectal advancement flap 3 months postop    Plan:     Continue care  Return to clinic 6 weeks  I think it is nearly closed down. There's just  a punctate vaginal wound but the tract is closed.   At that point also consider a preop colonoscopy and an ileostomy takedown in few months after wound fully closed. She and I feel encouraged.  She is leaning towards waiting until Jan 2013

## 2011-05-07 ENCOUNTER — Encounter: Payer: Self-pay | Admitting: Internal Medicine

## 2011-05-12 ENCOUNTER — Encounter (INDEPENDENT_AMBULATORY_CARE_PROVIDER_SITE_OTHER): Payer: Self-pay | Admitting: Surgery

## 2011-05-12 ENCOUNTER — Ambulatory Visit (INDEPENDENT_AMBULATORY_CARE_PROVIDER_SITE_OTHER): Payer: 59 | Admitting: Surgery

## 2011-05-12 VITALS — BP 104/82 | HR 60 | Temp 97.2°F | Resp 18 | Ht 63.0 in | Wt 165.0 lb

## 2011-05-12 DIAGNOSIS — N824 Other female intestinal-genital tract fistulae: Secondary | ICD-10-CM

## 2011-05-12 DIAGNOSIS — N823 Fistula of vagina to large intestine: Secondary | ICD-10-CM

## 2011-05-12 DIAGNOSIS — Z85048 Personal history of other malignant neoplasm of rectum, rectosigmoid junction, and anus: Secondary | ICD-10-CM

## 2011-05-12 NOTE — Patient Instructions (Signed)
Continue antibiotics x 2 weeks.  Call if not resolved in 2 weeks

## 2011-05-12 NOTE — Progress Notes (Signed)
Subjective:     Patient ID: Emily Holloway, female   DOB: November 04, 1955, 55 y.o.   MRN: [id number]  HPI  Patient Care Team: Candace Darolyn Rua as PCP - General (Family Medicine) Iva Boop, MD as Consulting Physician (Gastroenterology) Ardeth Sportsman, MD (General Surgery)  This patient is a 55 y.o.female who presents today for surgical evaluation.   Reason for visit: Followup on rectovaginal fistula. History of stage I rectal cancer status post low anterior resection  Patient notes she was doing well until last week. She felt increased drainage and a little discomfort. She start on the Augmentin antibiotic. It has calmed down. She notes that the drainage has been a light yellow/tan. No blood, no brown stool.  It has been scant volume. Not high volume like in the past. No fevers no chills no sweats. She's not having any mucus ball bowel movements.  She is emptying her ileostomy about 6-8 times a day. She she empties and when there was less than halfway full. That has not changed.  Past Medical History  Diagnosis Date  . Rectovaginal fistula   . Cancer     colon    Past Surgical History  Procedure Date  . Colon resection 2010    COLON CANCER  . Ostomy take down 2011  . Ostomy placement 2011    with RV fistula repair  . Tubal ligation   . Rectovaginal fistula closure [id number], [id number]    History   Social History  . Marital Status: Married    Spouse Name: N/A    Number of Children: N/A  . Years of Education: N/A   Occupational History  . Not on file.   Social History Main Topics  . Smoking status: Former Games developer  . Smokeless tobacco: Never Used  . Alcohol Use: No  . Drug Use: No  . Sexually Active:    Other Topics Concern  . Not on file   Social History Narrative  . No narrative on file    Family History  Problem Relation Age of Onset  . Hypertension Mother   . Cancer Father   . Diabetes Father   . Cancer Brother     Current outpatient  prescriptions:AMOXICILLIN PO, Take by mouth daily. Take for 10 days , Disp: , Rfl: ;  Calcium-Vitamin D-Vitamin K (CALCIUM SOFT CHEWS PO), Take 600 Units by mouth daily.  , Disp: , Rfl: ;  Multiple Vitamin (MULTIVITAMIN) capsule, Take 1 capsule by mouth daily.  , Disp: , Rfl: ;  Nutritional Supplements (PYCNOGENOL PO), Take 50 mg by mouth.  , Disp: , Rfl:   Allergies  Allergen Reactions  . Sulfonamide Derivatives     REACTION: itching       Review of Systems  Constitutional: Negative for fever, chills and diaphoresis.  HENT: Negative for ear pain, sore throat and trouble swallowing.   Eyes: Negative for photophobia and visual disturbance.  Respiratory: Negative for cough and choking.   Cardiovascular: Negative for chest pain, palpitations and leg swelling.  Gastrointestinal: Negative for nausea, vomiting, abdominal pain, diarrhea, constipation, blood in stool, anal bleeding and rectal pain.  Genitourinary: Positive for vaginal discharge. Negative for dysuria, urgency, frequency, hematuria, decreased urine volume, vaginal bleeding, difficulty urinating and genital sores.  Musculoskeletal: Negative for myalgias and gait problem.  Skin: Negative for color change, pallor and rash.  Neurological: Negative for dizziness, speech difficulty, weakness and numbness.  Hematological: Negative for adenopathy.  Psychiatric/Behavioral: Negative for confusion and agitation. The patient is not  nervous/anxious.        Objective:   Physical Exam  Constitutional: She is oriented to person, place, and time. She appears well-developed and well-nourished. No distress.  HENT:  Head: Normocephalic.  Mouth/Throat: Oropharynx is clear and moist. No oropharyngeal exudate.  Eyes: Conjunctivae and EOM are normal. Pupils are equal, round, and reactive to light. No scleral icterus.  Neck: Normal range of motion. No tracheal deviation present.  Cardiovascular: Normal rate and intact distal pulses.     Pulmonary/Chest: Effort normal. No respiratory distress. She exhibits no tenderness.  Abdominal: Soft. She exhibits no distension and no mass. There is no tenderness. Hernia confirmed negative in the right inguinal area and confirmed negative in the left inguinal area.       Incisions clean with normal healing ridges.  No hernias.  Ileostomy w/o hernia  Genitourinary: There is no rash or tenderness on the right labia. There is no rash or tenderness on the left labia. No erythema or bleeding around the vagina.       1x39mm wound left postlat, 1.5cm from introitus with scant yellow plaque.  Probes 3mm only.  No tracking.  No fluctuance of RV septum.  Scant vaginal discharge tan/creamy.  I used silver nitrate stick.  Perianal skin clean with good hygiene.  No pruritis.  Normal sphincter tone.  No fissure.  No abscess/fistula.  No pilonidal disease.    Tolerates digital rectal exam.  Anastomosis feels intact w/o stricture.  ~1.5cm diameter about 2cm from anal verge.  No rectal masses.  No expression of fluid/blood out vaginal wound with rectal exam/pressure.    Musculoskeletal: Normal range of motion. She exhibits no tenderness.  Lymphadenopathy:       Right: No inguinal adenopathy present.       Left: No inguinal adenopathy present.  Neurological: She is alert and oriented to person, place, and time. No cranial nerve deficit. She exhibits normal muscle tone. Coordination normal.  Skin: Skin is warm and dry. No rash noted. She is not diaphoretic.  Psychiatric: She has a normal mood and affect. Her behavior is normal.       Assessment:     Persistent but nearly closed vaginal wound, status post rectovaginal fistula repair.    Plan:     Continue Augmentin antibiotic for 2 more weeks. Perhaps a six-week trial.  If this worsens, examination anesthesia to rule out persistent fistula. If that is the case, then she would require some type of tissue flap (gracillus vs labial). I am skeptical to try  a fibrin glue injection but that might be another option.  RTC 4 weeks

## 2011-06-09 ENCOUNTER — Encounter (INDEPENDENT_AMBULATORY_CARE_PROVIDER_SITE_OTHER): Payer: Self-pay | Admitting: Surgery

## 2011-06-09 ENCOUNTER — Ambulatory Visit (INDEPENDENT_AMBULATORY_CARE_PROVIDER_SITE_OTHER): Payer: 59 | Admitting: Surgery

## 2011-06-09 VITALS — BP 114/70 | HR 68 | Temp 96.8°F | Resp 18 | Ht 63.0 in | Wt 163.5 lb

## 2011-06-09 DIAGNOSIS — Z85048 Personal history of other malignant neoplasm of rectum, rectosigmoid junction, and anus: Secondary | ICD-10-CM

## 2011-06-09 DIAGNOSIS — N898 Other specified noninflammatory disorders of vagina: Secondary | ICD-10-CM

## 2011-06-09 DIAGNOSIS — N824 Other female intestinal-genital tract fistulae: Secondary | ICD-10-CM

## 2011-06-09 DIAGNOSIS — N823 Fistula of vagina to large intestine: Secondary | ICD-10-CM

## 2011-06-09 NOTE — Progress Notes (Signed)
Subjective:     Patient ID: Emily Holloway, female   DOB: 01-15-1956, 55 y.o.   MRN: [id number]  HPI  Patient Care Team: Candace Darolyn Rua as PCP - General (Family Medicine) Iva Boop, MD as Consulting Physician (Gastroenterology) Ardeth Sportsman, MD (General Surgery)  This patient is a 55 y.o.female who presents today for surgical evaluation.   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 She completed antibiotics. For the past month, however, once a week she gets some pelvic/rectal cramping and a little change in her vaginal drainage. She worries that it is rectal drainage again. No fevers or chills.  She continues to empty the ileostomy about 6 times a day. She'll she empties it when it's on the lesser halfway full. She is overdue for a Pap smear. She denies any rectal drainage or R. rectal bleeding.  Past Medical History  Diagnosis Date  . Rectovaginal fistula   . Cancer     colon    Past Surgical History  Procedure Date  . Colon resection 2010    COLON CANCER  . Ostomy take down 2011  . Ostomy placement 2011    with RV fistula repair  . Tubal ligation   . Rectovaginal fistula closure [id number], [id number]    History   Social History  . Marital Status: Married    Spouse Name: N/A    Number of Children: N/A  . Years of Education: N/A   Occupational History  . Not on file.   Social History Main Topics  . Smoking status: Former Games developer  . Smokeless tobacco: Never Used  . Alcohol Use: No  . Drug Use: No  . Sexually Active:    Other Topics Concern  . Not on file   Social History Narrative  . No narrative on file    Family History  Problem Relation Age of Onset  . Hypertension Mother   . Cancer Father   . Diabetes Father   . Cancer Brother     Current outpatient prescriptions:Calcium-Vitamin D-Vitamin K (CALCIUM SOFT CHEWS PO), Take 600 Units by mouth daily.  , Disp: , Rfl: ;  Multiple Vitamin  (MULTIVITAMIN) capsule, Take 1 capsule by mouth daily.  , Disp: , Rfl: ;  Nutritional Supplements (PYCNOGENOL PO), Take 50 mg by mouth.  , Disp: , Rfl:   Allergies  Allergen Reactions  . Sulfonamide Derivatives     REACTION: itching       Review of Systems  Constitutional: Negative for fever, chills, diaphoresis, appetite change and fatigue.  HENT: Negative for ear pain, sore throat, trouble swallowing, neck pain and ear discharge.   Eyes: Negative for photophobia, discharge and visual disturbance.  Respiratory: Negative for cough, choking, chest tightness and shortness of breath.   Cardiovascular: Negative for chest pain and palpitations.  Gastrointestinal: Negative for nausea, vomiting, abdominal pain, diarrhea, constipation, anal bleeding and rectal pain.  Genitourinary: Positive for vaginal discharge. Negative for dysuria, urgency, frequency, hematuria, decreased urine volume, vaginal bleeding, difficulty urinating, vaginal pain and menstrual problem.  Musculoskeletal: Negative for myalgias and gait problem.  Skin: Negative for color change, pallor and rash.  Neurological: Negative for dizziness, speech difficulty, weakness and numbness.  Hematological: Negative for adenopathy.  Psychiatric/Behavioral: Negative for confusion and agitation. The patient is not nervous/anxious.        Objective:   Physical Exam  Constitutional: She is oriented to person, place, and time. She appears well-developed and well-nourished. No distress.  HENT:  Head: Normocephalic.  Mouth/Throat: Oropharynx is clear and moist. No oropharyngeal exudate.  Eyes: Conjunctivae and EOM are normal. Pupils are equal, round, and reactive to light. No scleral icterus.  Neck: Normal range of motion. No tracheal deviation present.  Cardiovascular: Normal rate and intact distal pulses.   Pulmonary/Chest: Effort normal. No respiratory distress. She exhibits no tenderness.  Abdominal: Soft. She exhibits no  distension. There is no tenderness. Hernia confirmed negative in the right inguinal area and confirmed negative in the left inguinal area.       Incisions clean with normal healing ridges.  No hernias  Genitourinary: Vaginal discharge found.       Posterior vaginal wall closed.  No fistula detected.  No wounds found.  No fluctuance.  Scant vaginal drainage - seems physiologic & not foul/bloody/grey  Perianal skin clean with good hygiene.  No pruritis.  No pilonidal disease.  No fissure.  No abscess/fistula.  I did not do a rectal exam     Musculoskeletal: Normal range of motion. She exhibits no tenderness.  Lymphadenopathy:       Right: No inguinal adenopathy present.       Left: No inguinal adenopathy present.  Neurological: She is alert and oriented to person, place, and time. No cranial nerve deficit. She exhibits normal muscle tone. Coordination normal.  Skin: Skin is warm and dry. No rash noted. She is not diaphoretic.  Psychiatric: She has a normal mood and affect. Her behavior is normal.       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  Colonoscopy to make sure there is no evidence of recurrence or any disease proximal. I think I can probably be done through the ileostomy.  GYN examination with Pap smear. She is overdue. I will defer if she wishes to have it done by her primary care physician or get a new GYN consult. I think a good be good to get an objective evaluation to make sure that there is no other pathology associated with vagina that is being overlooked.  If those workups are negative, consider examination under anesthesia. If there is no evidence of fistula or recurrence, then plan ileostomy takedown.   The anatomy & physiology of the digestive tract was discussed.  The pathophysiology was  discussed.  Possibility of remaining with an ostomy permanently was discussed.  I offered ostomy takedown.  Techniques were discussed.   Risks such as bleeding, infection, abscess, leak, reoperation, possible re-ostomy, injury to other organs, hernia, heart attack, death, and other risks were discussed.  Goals of post-operative recovery were discussed as well.  We will work to minimize complications.  Questions were answered.  The patient expresses understanding & wishes to proceed with surgery.

## 2011-06-10 ENCOUNTER — Telehealth: Payer: Self-pay

## 2011-06-10 NOTE — Telephone Encounter (Signed)
Message copied by Annett Fabian on Tue Jun 10, 2011  3:30 PM ------      Message from: Iva Boop      Created: Tue Jun 10, 2011  7:43 AM      Regarding: needs appointment       Dr. Michaell Cowing has cleared her for repeat colonoscopy      I would like to see her in the office to arrange            Next available is ok            Personal hx rectal cancer

## 2011-06-10 NOTE — Telephone Encounter (Signed)
Patient scheduled for 07/04/11 11:30

## 2011-06-12 ENCOUNTER — Telehealth (INDEPENDENT_AMBULATORY_CARE_PROVIDER_SITE_OTHER): Payer: Self-pay

## 2011-06-12 NOTE — Telephone Encounter (Signed)
LMOM for pt to call me b/c I have her scheduled for an appt w/ DR Seymour Bars for 07-07-11 @11 :00/ [facility]

## 2011-06-16 ENCOUNTER — Telehealth (INDEPENDENT_AMBULATORY_CARE_PROVIDER_SITE_OTHER): Payer: Self-pay

## 2011-06-16 DIAGNOSIS — Z85048 Personal history of other malignant neoplasm of rectum, rectosigmoid junction, and anus: Secondary | ICD-10-CM

## 2011-06-16 NOTE — Telephone Encounter (Signed)
Called pt to notify her of the referral appt to a OB/GYN appt with DR Seymour Bars on 07-07-11 @11 :00./ [facility]

## 2011-07-04 ENCOUNTER — Ambulatory Visit (INDEPENDENT_AMBULATORY_CARE_PROVIDER_SITE_OTHER): Payer: 59 | Admitting: Internal Medicine

## 2011-07-04 ENCOUNTER — Encounter: Payer: Self-pay | Admitting: Internal Medicine

## 2011-07-04 VITALS — BP 112/70 | HR 80 | Ht 63.0 in | Wt 164.2 lb

## 2011-07-04 DIAGNOSIS — Z85048 Personal history of other malignant neoplasm of rectum, rectosigmoid junction, and anus: Secondary | ICD-10-CM

## 2011-07-04 DIAGNOSIS — N823 Fistula of vagina to large intestine: Secondary | ICD-10-CM

## 2011-07-04 DIAGNOSIS — N824 Other female intestinal-genital tract fistulae: Secondary | ICD-10-CM

## 2011-07-04 DIAGNOSIS — Z1211 Encounter for screening for malignant neoplasm of colon: Secondary | ICD-10-CM

## 2011-07-04 MED ORDER — PEG-KCL-NACL-NASULF-NA ASC-C 100 G PO SOLR: 1.0000 | Freq: Once | ORAL | Status: DC

## 2011-07-04 NOTE — Progress Notes (Signed)
  Subjective:    Patient ID: Emily Holloway, female    DOB: 1955/09/17, 55 y.o.   MRN: [id number]  HPI This is a very pleasant 55 year old woman, I diagnosed rectal cancer in her in late 2010. She had this resected and then has had the deal with complications of a rectovaginal fistula over time. Dr. Michaell Cowing now believe that the rectovaginal fistula is closed. He is planning for an ileostomy takedown and thinks that it's appropriate for her to have a screening colonoscopy to followup her rectal cancer as well as clear the air prior to any surgery. She does not have much bowel movement at this time as she has a functioning diverting ileostomy. She is having intermittent vaginal discharge, and is scheduled to see a gynecologist (Dr. Seymour Bars) soon. Allergies  Allergen Reactions  . Sulfonamide Derivatives     REACTION: itching   Outpatient Prescriptions Prior to Visit  Medication Sig Dispense Refill  . Calcium-Vitamin D-Vitamin K (CALCIUM SOFT CHEWS PO) Take 600 Units by mouth daily.        . Multiple Vitamin (MULTIVITAMIN) capsule Take 1 capsule by mouth daily.        . Nutritional Supplements (PYCNOGENOL PO) Take 50 mg by mouth.         Past Medical History  Diagnosis Date  . Rectovaginal fistula   . Rectal carcinoma    Past Surgical History  Procedure Date  . Colon resection 2010    COLON CANCER  . Ostomy take down 2011  . Ostomy placement 2011    with RV fistula repair  . Tubal ligation   . Rectovaginal fistula closure [id number], [id number]  . Colonoscopy w/ biopsies and polypectomy 04/09/2009    adenocarcinoma  . Eus 05/03/2009    rectal adenocarcinoma   History   Social History  . Marital Status: Married    Spouse Name: N/A    Number of Children: N/A  . Years of Education: N/A   Social History Main Topics  . Smoking status: Former Games developer  . Smokeless tobacco: Never Used  . Alcohol Use: No  . Drug Use: No  . Sexually Active:    Other Topics Concern  . None   Social History  Narrative  . None   Family History  Problem Relation Age of Onset  . Hypertension Mother   . Cancer Father   . Diabetes Father   . Cancer Brother         Review of Systems Otherwise negative    Objective:   Physical Exam General:  NAD Eyes:   anicteric Lungs:  clear Heart:  S1S2 no rubs, murmurs or gallops Abdomen:  soft and nontender, BS+ ileostomy in the right lower Ext:   no edema             Assessment & Plan:  55 year old  woman status post resection of rectal cancer that was staged T2N0. Dr. Michaell Cowing believes her rectovaginal fistula is healed. He is planning for diverting ileostomy takedown. It is an appropriate time for a screening colonoscopy.  I have some concerns about how effective the prep will be but I think there is no other option but to have her drink a prep. I suspect Sobel bypass the ileostomy and hopefully cleanse the colon adequately.  The risks and benefits as well as alternatives of endoscopic procedure(s) have been discussed and reviewed. All questions answered. The patient agrees to proceed.

## 2011-07-04 NOTE — Patient Instructions (Signed)
You have been scheduled for a Colonoscopy with separate instructions given. Your prep kit has been sent to your pharmacy for you to pick up. Dr. Leone Payor will contact Dr. Michaell Cowing about your procedure and prep and we will contact you with an update concerning that.

## 2011-07-08 ENCOUNTER — Telehealth: Payer: Self-pay | Admitting: Gastroenterology

## 2011-07-08 NOTE — Telephone Encounter (Signed)
Left message on patient's voicemail for her to return my call.

## 2011-07-08 NOTE — Telephone Encounter (Signed)
Message copied by Bernita Buffy on Tue Jul 08, 2011 12:39 PM ------      Message from: Stan Head E      Created: Mon Jul 07, 2011  4:42 PM      Regarding: FW: Question about prep       Tell Ms. Kiang after reviewing things with Dr. Michaell Cowing will try the colonoscopy without a prep - think that will make the most sense and I will irrigate the colon once I am in there                  Ask her how her GYN visit went - what did she find out?      ----- Message -----         From: Ardeth Sportsman, MD         Sent: 07/04/2011   4:42 PM           To: Iva Boop, MD      Subject: RE: Question about prep                                  Oral prep would come out the ileostomy into the pouch.              Maybe try a few enemas instead - antegrade (?w foley balloon) though lower opening of loop ileostomy = antegrade cleanout. Otherwise can do a classic PR enema & see if see evacuates out that way (a little bit of a stricture at anus, though).            ----- Message -----         From: Iva Boop, MD         Sent: 07/04/2011   1:31 PM           To: Ardeth Sportsman, MD      Subject: RE: Question about prep                                  I have been surprised at amount of mucous in Hartmann's pouches but ? If the prep will just go into her ostomy bag?            Acumen is lacking here      ----- Message -----         From: Ardeth Sportsman, MD         Sent: 07/04/2011   1:23 PM           To: Iva Boop, MD      Subject: RE: Question about prep                                  i see no reason for a bowel prep since it is a loop ileostomy & the colon has had months to be empty.  I have never really detected mucus balls in the rectum...            But I defer to your superior acumen!            ----- Message -----         From: Iva Boop, MD         Sent: 07/04/2011  12:43 PM  To: Ardeth Sportsman, MD      Subject: Question about prep                                      Do you think I should alter her prep?            Have not really faced prepping with a diverting ileostomy. Am going to try the usual way and hope for the best

## 2011-07-09 NOTE — Telephone Encounter (Signed)
Talked with patient and told her Dr. Marvell Fuller suggestions concerning colon prep. Patient stated that she went to her GYN appointment and Dr. did a pap and [place] and everything looked fine but when she was removing to speculum she could see the fistula and thinks that the discharge and problems are coming from that site, don't think it has completely closed.

## 2011-07-09 NOTE — Telephone Encounter (Signed)
Called and left a return call message on patient's voicemail.

## 2011-07-10 ENCOUNTER — Telehealth (INDEPENDENT_AMBULATORY_CARE_PROVIDER_SITE_OTHER): Payer: Self-pay

## 2011-07-10 DIAGNOSIS — N823 Fistula of vagina to large intestine: Secondary | ICD-10-CM

## 2011-07-10 MED ORDER — AMOXICILLIN-POT CLAVULANATE 875-125 MG PO TABS: 1.0000 | ORAL_TABLET | Freq: Two times a day (BID) | ORAL | Status: AC

## 2011-07-10 NOTE — Telephone Encounter (Signed)
Returned pt's voicemail message about her seeing Dr Seymour Bars for her pelvic exam. The pt wanted to notify us that her pelvic exam was ok but Dr Seymour Bars stated she still saw the fistula. The pt is starting to have more drainage daily along with rectal spasms. The pt is scheduled with Dr Leone Payor for the colonoscopy on 08-21-11. I did speak with Dr Michaell Cowing about the pt and he said to refill the Augmentin 875mg . Dr Michaell Cowing wants to see pt after the colonoscopy and I couldn't make that appt today b/c the January calender was not open yet for appt's. The pt will call me back in January to scheduled her next appt. The pt knows if things get worse to call us back./ [facility]

## 2011-07-10 NOTE — Telephone Encounter (Signed)
Called pt to notify her that Dr Michaell Cowing wants to refer her to see a urologist Dr McDiarmid to get his opinion about the Martius flap that Dr Michaell Cowing wants to do when he does surgery next year. The referral order is in epic and our scheduler Gavin Pound will call her with the appt.Emily Holloway

## 2011-07-17 ENCOUNTER — Encounter (INDEPENDENT_AMBULATORY_CARE_PROVIDER_SITE_OTHER): Payer: Self-pay

## 2011-08-08 ENCOUNTER — Telehealth (INDEPENDENT_AMBULATORY_CARE_PROVIDER_SITE_OTHER): Payer: Self-pay

## 2011-08-08 DIAGNOSIS — N823 Fistula of vagina to large intestine: Secondary | ICD-10-CM

## 2011-08-08 MED ORDER — TRAMADOL HCL 50 MG PO TABS: 50.0000 mg | ORAL_TABLET | Freq: Four times a day (QID) | ORAL | Status: DC | PRN

## 2011-08-08 NOTE — Telephone Encounter (Signed)
Called pt to give her the f/u appt with Dr Michaell Cowing. Pt req. RF on Tramadol./

## 2011-08-14 ENCOUNTER — Telehealth: Payer: Self-pay | Admitting: Gastroenterology

## 2011-08-14 NOTE — Telephone Encounter (Signed)
Emily Holloway brought to my attention that there was a conflict in dates for the procedure. Patient originally scheduled appt for 08/25/11 and then changed her mind to 08/21/11. Appointment didn't get changed and as of today the appointment slot had already been taken for the 08/21/11. Patient stated that she would just wait and see what Dr. Michaell Cowing wanted to do because she had other issues that were more important. Patient stated that she would contact me after consulting with Dr. Michaell Cowing. Appointment cancelled for 08/25/11.

## 2011-08-25 ENCOUNTER — Other Ambulatory Visit: Payer: 59 | Admitting: Internal Medicine

## 2011-08-25 ENCOUNTER — Other Ambulatory Visit (INDEPENDENT_AMBULATORY_CARE_PROVIDER_SITE_OTHER): Payer: Self-pay | Admitting: Surgery

## 2011-08-27 ENCOUNTER — Telehealth (INDEPENDENT_AMBULATORY_CARE_PROVIDER_SITE_OTHER): Payer: Self-pay

## 2011-08-27 NOTE — Telephone Encounter (Signed)
Called pt to notify her that she didn't need to come in for o.v. With Dr Michaell Cowing on 09-01-11 unless if she had lots of questions regarding the sx with Dr Michaell Cowing. The pt told me I could ask Dr Michaell Cowing her questions and get back to her. The pt also advised me that she didn't get her colonoscopy last week by Dr Leone Payor b/c of a scheduling conflict. I advised pt that Dr Michaell Cowing definitely wants her to get the colonoscopy before her sx. I told pt that I would call Dr Marvell Fuller office and get that back on the books again. I did call Dr Marvell Fuller office and got the colonoscopy scheduled for 09-04-11 @10 :00. The pt is aware of the colonoscopy. I advised her that I would go ahead and get her surgical orders into scheduling so they can call her with an surgery date.

## 2011-09-03 ENCOUNTER — Encounter (INDEPENDENT_AMBULATORY_CARE_PROVIDER_SITE_OTHER): Payer: 59 | Admitting: Surgery

## 2011-09-04 ENCOUNTER — Ambulatory Visit (AMBULATORY_SURGERY_CENTER): Payer: 59 | Admitting: Internal Medicine

## 2011-09-04 ENCOUNTER — Encounter: Payer: Self-pay | Admitting: Internal Medicine

## 2011-09-04 VITALS — BP 105/75 | HR 65 | Temp 98.3°F | Resp 15 | Ht 63.0 in | Wt 164.0 lb

## 2011-09-04 DIAGNOSIS — Z85048 Personal history of other malignant neoplasm of rectum, rectosigmoid junction, and anus: Secondary | ICD-10-CM

## 2011-09-04 DIAGNOSIS — Z1211 Encounter for screening for malignant neoplasm of colon: Secondary | ICD-10-CM

## 2011-09-04 MED ORDER — SODIUM CHLORIDE 0.9 % IV SOLN: 500.0000 mL | INTRAVENOUS | Status: DC

## 2011-09-04 NOTE — Patient Instructions (Signed)
Things looked normal for your situation - see the report. You will ned a prepped colonoscopy when able to do that. Iva Boop, MD, [facility]

## 2011-09-04 NOTE — Progress Notes (Signed)
Dr Leone Payor went thru ostomy with procedure as well. Pt tolerated procedure well with npo problems or distress. ewm

## 2011-09-04 NOTE — Progress Notes (Signed)
Patient did not have preoperative order for IV antibiotic SSI prophylaxis. (G8918)  Patient did not experience any of the following events: a burn prior to discharge; a fall within the facility; wrong site/side/patient/procedure/implant event; or a hospital transfer or hospital admission upon discharge from the facility. (G8907)  

## 2011-09-04 NOTE — Op Note (Signed)
[facility] [address] [place], [place]  [address]  COLONOSCOPY PROCEDURE REPORT  PATIENT:  Emily Holloway, Emily Holloway  MR#:  [id number] BIRTHDATE:  1956-01-21, 55 yrs. old  GENDER:  female ENDOSCOPIST:  Iva Boop, MD, [facility] REF. BY: PROCEDURE DATE:  09/04/2011 PROCEDURE:  Colonoscopy 30865 ASA CLASS:  Class I INDICATIONS:  surveillance and high-risk screening history of rectal cancer 03/2009 - s/p resection subsequent rectovaginal fistula, has diverting ileostomy MEDICATIONS:   These medications were titrated to patient response per physician's verbal order, Fentanyl 50 mcg IV, Versed 7 mg IV  DESCRIPTION OF PROCEDURE:   After the risks benefits and alternatives of the procedure were thoroughly explained, informed consent was obtained.  post-operative deformity The LB 180AL [id number] endoscope was introduced through the anus and advanced to the right colon (suspected). The cecum was entered via the ileostomy. The exam was limited by retained mucoid secretions/exudate..  The quality of the prep was none.  The instrument was then slowly withdrawn as the colon was fully examined. <<PROCEDUREIMAGES>>  FINDINGS:  The scope was advanced to suspected right colon and then the ileostomy was used to enter the cecum. Some of the right colon was likely not entered. Colitis was found throughout the colon. 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  A postoperative change was noted in the rectum. Pouch seen in rectum with staple at base.   Retroflexed views in the rectum revealed not done.  The scope was then withdrawn in  minutes from the cecum and the procedure completed. 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 RECOMMENDATIONS: She will need a prepped colonoscopy when able to do so. This exam was inadequate for true screening/surveillance. REPEAT EXAM:  In 1 year(s) for Colonoscopy. would do sooner if possible.  Iva Boop, MD, [facility]  CC:  Karie Soda, MD, Alfredo Martinez, MD, Merri Brunette, MD and The Patient  n. eSIGNED:   Iva Boop at 09/04/2011 12:19 PM  [name], [phone]

## 2011-09-05 ENCOUNTER — Telehealth: Payer: Self-pay | Admitting: *Deleted

## 2011-09-05 NOTE — Telephone Encounter (Signed)
  Follow up Call-  Call back number 09/04/2011  Post procedure Call Back phone  # [phone]  Permission to leave phone message Yes     Patient questions:  Do you have a fever, pain , or abdominal swelling? no Pain Score  0 *  Have you tolerated food without any problems? yes  Have you been able to return to your normal activities? yes  Do you have any questions about your discharge instructions: Diet   no Medications  no Follow up visit  no  Do you have questions or concerns about your Care? no  Actions: * If pain score is 4 or above: No action needed, pain <4.

## 2011-10-14 ENCOUNTER — Encounter (HOSPITAL_COMMUNITY): Payer: Self-pay | Admitting: Pharmacy Technician

## 2011-10-17 ENCOUNTER — Encounter (HOSPITAL_COMMUNITY): Payer: Self-pay

## 2011-10-17 ENCOUNTER — Encounter (HOSPITAL_COMMUNITY)
Admission: RE | Admit: 2011-10-17 | Discharge: 2011-10-17 | Disposition: A | Discharge status: Home / Self Care | Payer: 59 | Source: Ambulatory Visit | Attending: Surgery | Admitting: Surgery

## 2011-10-17 LAB — CBC
Hemoglobin: 11.5 g/dL — ABNORMAL LOW (ref 12.0–15.0)
MCH: 30.3 pg (ref 26.0–34.0)
RBC: 3.79 MIL/uL — ABNORMAL LOW (ref 3.87–5.11)

## 2011-10-17 LAB — SURGICAL PCR SCREEN
MRSA, PCR: NEGATIVE
Staphylococcus aureus: NEGATIVE

## 2011-10-17 NOTE — Patient Instructions (Signed)
YOUR SURGERY IS SCHEDULED ON:  Thursday  3/28  AT 7:20 AM  REPORT TO [facility] AT:  5:30 AM      PHONE # FOR SHORT STAY IS [phone]  DO NOT EAT OR DRINK ANYTHING AFTER MIDNIGHT THE NIGHT BEFORE YOUR SURGERY.  YOU MAY BRUSH YOUR TEETH, RINSE OUT YOUR MOUTH--BUT NO WATER, NO FOOD, NO CHEWING GUM, NO MINTS, NO CANDIES, NO CHEWING TOBACCO.  PLEASE TAKE THE FOLLOWING MEDICATIONS THE AM OF YOUR SURGERY WITH A FEW SIPS OF WATER:  NO MEDICINES TO TAKE    IF YOU USE INHALERS--USE YOUR INHALERS THE AM OF YOUR SURGERY AND BRING INHALERS TO THE HOSPITAL -TAKE TO SURGERY.    IF YOU ARE DIABETIC:  DO NOT TAKE ANY DIABETIC MEDICATIONS THE AM OF YOUR SURGERY.  IF YOU TAKE INSULIN IN THE EVENINGS--PLEASE ONLY TAKE 1/2 NORMAL EVENING DOSE THE NIGHT BEFORE YOUR SURGERY.  NO INSULIN THE AM OF YOUR SURGERY.  IF YOU HAVE SLEEP APNEA AND USE CPAP OR BIPAP--PLEASE BRING THE MASK --NOT THE MACHINE-NOT THE TUBING   -JUST THE MASK. DO NOT BRING VALUABLES, MONEY, CREDIT CARDS.  CONTACT LENS, DENTURES / PARTIALS, GLASSES SHOULD NOT BE WORN TO SURGERY AND IN MOST CASES-HEARING AIDS WILL NEED TO BE REMOVED.  BRING YOUR GLASSES CASE, ANY EQUIPMENT NEEDED FOR YOUR CONTACT LENS. FOR PATIENTS ADMITTED TO THE HOSPITAL--CHECK OUT TIME THE DAY OF DISCHARGE IS 11:00 AM.  ALL INPATIENT ROOMS ARE PRIVATE - WITH BATHROOM, TELEPHONE, TELEVISION AND WIFI INTERNET. IF YOU ARE BEING DISCHARGED THE SAME DAY OF YOUR SURGERY--YOU CAN NOT DRIVE YOURSELF HOME--AND SHOULD NOT GO HOME ALONE BY TAXI OR BUS.  NO DRIVING OR OPERATING MACHINERY FOR 24 HOURS FOLLOWING ANESTHESIA / PAIN MEDICATIONS.                            SPECIAL INSTRUCTIONS:  CHLORHEXIDINE SOAP SHOWER (other brand names are Betasept and Hibiclens ) PLEASE SHOWER WITH CHLORHEXIDINE THE NIGHT BEFORE YOUR SURGERY AND THE AM OF YOUR SURGERY. DO NOT USE CHLORHEXIDINE ON YOUR FACE OR PRIVATE AREAS--YOU MAY USE YOUR NORMAL SOAP THOSE AREAS AND YOUR NORMAL  SHAMPOO.  WOMEN SHOULD AVOID SHAVING UNDER ARMS AND SHAVING LEGS 48 HOURS BEFORE USING CHLORHEXIDINE TO AVOID SKIN IRRITATION.  DO NOT USE IF ALLERGIC TO CHLORHEXIDINE.  PLEASE READ OVER ANY  FACT SHEETS THAT YOU WERE GIVEN: MRSA INFORMATION

## 2011-10-22 NOTE — Anesthesia Preprocedure Evaluation (Addendum)

## 2011-10-23 ENCOUNTER — Encounter (HOSPITAL_COMMUNITY): Payer: Self-pay | Admitting: Anesthesiology

## 2011-10-23 ENCOUNTER — Encounter (HOSPITAL_COMMUNITY): Payer: Self-pay | Admitting: *Deleted

## 2011-10-23 ENCOUNTER — Observation Stay (HOSPITAL_COMMUNITY)
Admission: RE | Admit: 2011-10-23 | Discharge: 2011-10-24 | Disposition: A | Discharge status: Home / Self Care | Payer: 59 | Source: Ambulatory Visit | Attending: Surgery | Admitting: Surgery

## 2011-10-23 ENCOUNTER — Ambulatory Visit (HOSPITAL_COMMUNITY): Payer: 59 | Admitting: Anesthesiology

## 2011-10-23 ENCOUNTER — Encounter (HOSPITAL_COMMUNITY)
Admission: RE | Disposition: A | Discharge status: Home / Self Care | Payer: Self-pay | Source: Ambulatory Visit | Attending: Surgery

## 2011-10-23 DIAGNOSIS — N824 Other female intestinal-genital tract fistulae: Secondary | ICD-10-CM

## 2011-10-23 DIAGNOSIS — Z01812 Encounter for preprocedural laboratory examination: Secondary | ICD-10-CM | POA: Insufficient documentation

## 2011-10-23 DIAGNOSIS — N823 Fistula of vagina to large intestine: Secondary | ICD-10-CM | POA: Insufficient documentation

## 2011-10-23 DIAGNOSIS — Z85048 Personal history of other malignant neoplasm of rectum, rectosigmoid junction, and anus: Secondary | ICD-10-CM | POA: Insufficient documentation

## 2011-10-23 HISTORY — PX: RECTO-VAGINAL FISSURE REPAIR: SHX5277

## 2011-10-23 LAB — CBC
Hemoglobin: 9.6 g/dL — ABNORMAL LOW (ref 12.0–15.0)
MCH: 30.6 pg (ref 26.0–34.0)
MCV: 96.2 fL (ref 78.0–100.0)
RBC: 3.14 MIL/uL — ABNORMAL LOW (ref 3.87–5.11)
WBC: 11.2 10*3/uL — ABNORMAL HIGH (ref 4.0–10.5)

## 2011-10-23 LAB — CREATININE, SERUM: GFR calc Af Amer: >90 mL/min (ref >90)

## 2011-10-23 SURGERY — FISTULECTOMY, RECTOVAGINAL
Anesthesia: General | Site: Vagina

## 2011-10-23 MED ORDER — HETASTARCH-ELECTROLYTES 6 % IV SOLN
INTRAVENOUS | Status: DC | PRN
  Administered 2011-10-23: 10:00:00 via INTRAVENOUS

## 2011-10-23 MED ORDER — SODIUM CHLORIDE 0.9 % IV SOLN
1.0000 g | INTRAVENOUS | Status: AC
  Administered 2011-10-23: 1 g via INTRAVENOUS

## 2011-10-23 MED ORDER — SUCCINYLCHOLINE CHLORIDE 20 MG/ML IJ SOLN
INTRAMUSCULAR | Status: DC | PRN
  Administered 2011-10-23: 100 mg via INTRAVENOUS

## 2011-10-23 MED ORDER — HEPARIN SODIUM (PORCINE) 5000 UNIT/ML IJ SOLN
5000.0000 [IU] | Freq: Three times a day (TID) | INTRAMUSCULAR | Status: DC
  Administered 2011-10-24: 5000 [IU] via SUBCUTANEOUS
  Filled 2011-10-23 (×4): qty 1

## 2011-10-23 MED ORDER — DIPHENHYDRAMINE HCL 50 MG/ML IJ SOLN: 12.5000 mg | Freq: Four times a day (QID) | INTRAMUSCULAR | Status: DC | PRN

## 2011-10-23 MED ORDER — LACTATED RINGERS IV BOLUS (SEPSIS): 1000.0000 mL | Freq: Three times a day (TID) | INTRAVENOUS | Status: DC | PRN

## 2011-10-23 MED ORDER — HYDROCORTISONE ACE-PRAMOXINE 2.5-1 % RE CREA
1.0000 "application " | TOPICAL_CREAM | Freq: Four times a day (QID) | RECTAL | Status: DC
  Administered 2011-10-23 (×2): 1 via RECTAL
  Filled 2011-10-23: qty 30

## 2011-10-23 MED ORDER — STERILE WATER FOR IRRIGATION IR SOLN
Status: DC | PRN
  Administered 2011-10-23: 10 mL

## 2011-10-23 MED ORDER — ONDANSETRON HCL 4 MG/2ML IJ SOLN: 4.0000 mg | Freq: Four times a day (QID) | INTRAMUSCULAR | Status: DC | PRN

## 2011-10-23 MED ORDER — KETAMINE HCL 10 MG/ML IJ SOLN
INTRAMUSCULAR | Status: DC | PRN
  Administered 2011-10-23 (×6): 5 mg via INTRAVENOUS

## 2011-10-23 MED ORDER — HYDROMORPHONE BOLUS VIA INFUSION: 0.5000 mg | INTRAVENOUS | Status: DC | PRN

## 2011-10-23 MED ORDER — SODIUM CHLORIDE 0.9 % IV SOLN
INTRAVENOUS | Status: AC
  Filled 2011-10-23: qty 1

## 2011-10-23 MED ORDER — NEOSTIGMINE METHYLSULFATE 1 MG/ML IJ SOLN
INTRAMUSCULAR | Status: DC | PRN
  Administered 2011-10-23: 3 mg via INTRAVENOUS

## 2011-10-23 MED ORDER — PANTOPRAZOLE SODIUM 40 MG PO TBEC
40.0000 mg | DELAYED_RELEASE_TABLET | Freq: Every day | ORAL | Status: DC
  Administered 2011-10-23 – 2011-10-24 (×2): 40 mg via ORAL
  Filled 2011-10-23 (×2): qty 1

## 2011-10-23 MED ORDER — BUPIVACAINE LIPOSOME 1.3 % IJ SUSP
20.0000 mL | Freq: Once | INTRAMUSCULAR | Status: AC
  Administered 2011-10-23: 20 mL
  Filled 2011-10-23: qty 20

## 2011-10-23 MED ORDER — ACETAMINOPHEN 10 MG/ML IV SOLN
INTRAVENOUS | Status: AC
  Filled 2011-10-23: qty 100

## 2011-10-23 MED ORDER — MIDAZOLAM HCL 5 MG/5ML IJ SOLN
INTRAMUSCULAR | Status: DC | PRN
  Administered 2011-10-23: 1 mg via INTRAVENOUS

## 2011-10-23 MED ORDER — HYALURONIDASE OVINE 200 UNIT/ML IJ SOLN
INTRAMUSCULAR | Status: AC
  Filled 2011-10-23: qty 1.2

## 2011-10-23 MED ORDER — ONDANSETRON HCL 4 MG PO TABS: 4.0000 mg | ORAL_TABLET | Freq: Four times a day (QID) | ORAL | Status: DC | PRN

## 2011-10-23 MED ORDER — ALUM & MAG HYDROXIDE-SIMETH 200-200-20 MG/5ML PO SUSP
30.0000 mL | Freq: Four times a day (QID) | ORAL | Status: DC | PRN
  Filled 2011-10-23: qty 30

## 2011-10-23 MED ORDER — FENTANYL CITRATE 0.05 MG/ML IJ SOLN
INTRAMUSCULAR | Status: DC | PRN
  Administered 2011-10-23: 25 ug via INTRAVENOUS
  Administered 2011-10-23: 50 ug via INTRAVENOUS
  Administered 2011-10-23: 25 ug via INTRAVENOUS

## 2011-10-23 MED ORDER — INDIGOTINDISULFONATE SODIUM 8 MG/ML IJ SOLN
INTRAMUSCULAR | Status: AC
  Filled 2011-10-23: qty 5

## 2011-10-23 MED ORDER — DROPERIDOL 2.5 MG/ML IJ SOLN
INTRAMUSCULAR | Status: DC | PRN
  Administered 2011-10-23: .5 mg via INTRAVENOUS

## 2011-10-23 MED ORDER — PROMETHAZINE HCL 25 MG/ML IJ SOLN: 12.5000 mg | Freq: Four times a day (QID) | INTRAMUSCULAR | Status: DC | PRN

## 2011-10-23 MED ORDER — GLYCOPYRROLATE 0.2 MG/ML IJ SOLN
INTRAMUSCULAR | Status: DC | PRN
  Administered 2011-10-23: .4 mg via INTRAVENOUS

## 2011-10-23 MED ORDER — DIPHENHYDRAMINE HCL 12.5 MG/5ML PO ELIX: 12.5000 mg | ORAL_SOLUTION | Freq: Four times a day (QID) | ORAL | Status: DC | PRN

## 2011-10-23 MED ORDER — ACETAMINOPHEN 10 MG/ML IV SOLN
INTRAVENOUS | Status: DC | PRN
  Administered 2011-10-23: 1000 mg via INTRAVENOUS

## 2011-10-23 MED ORDER — SODIUM CHLORIDE 0.9 % IR SOLN
Status: DC | PRN
  Administered 2011-10-23: 10:00:00

## 2011-10-23 MED ORDER — BUPIVACAINE-EPINEPHRINE 0.5% -1:200000 IJ SOLN
INTRAMUSCULAR | Status: AC
  Filled 2011-10-23: qty 1

## 2011-10-23 MED ORDER — AMOXICILLIN-POT CLAVULANATE 875-125 MG PO TABS
1.0000 | ORAL_TABLET | Freq: Two times a day (BID) | ORAL | Status: DC
  Administered 2011-10-23 – 2011-10-24 (×2): 1 via ORAL
  Filled 2011-10-23 (×3): qty 1

## 2011-10-23 MED ORDER — ACETAMINOPHEN 650 MG RE SUPP: 650.0000 mg | Freq: Four times a day (QID) | RECTAL | Status: DC | PRN

## 2011-10-23 MED ORDER — LACTATED RINGERS IV SOLN: INTRAVENOUS | Status: DC

## 2011-10-23 MED ORDER — LIDOCAINE-EPINEPHRINE (PF) 1 %-1:200000 IJ SOLN
INTRAMUSCULAR | Status: AC
  Filled 2011-10-23: qty 10

## 2011-10-23 MED ORDER — PROPOFOL 10 MG/ML IV EMUL
INTRAVENOUS | Status: DC | PRN
  Administered 2011-10-23: 180 mg via INTRAVENOUS

## 2011-10-23 MED ORDER — ONDANSETRON HCL 4 MG/2ML IJ SOLN
INTRAMUSCULAR | Status: DC | PRN
  Administered 2011-10-23: 2 mg via INTRAVENOUS

## 2011-10-23 MED ORDER — TRAMADOL HCL 50 MG PO TABS
50.0000 mg | ORAL_TABLET | Freq: Four times a day (QID) | ORAL | Status: DC | PRN
  Administered 2011-10-23 – 2011-10-24 (×2): 50 mg via ORAL
  Filled 2011-10-23 (×2): qty 1

## 2011-10-23 MED ORDER — DEXAMETHASONE SODIUM PHOSPHATE 4 MG/ML IJ SOLN
INTRAMUSCULAR | Status: DC | PRN
  Administered 2011-10-23: 8 mg via INTRAVENOUS

## 2011-10-23 MED ORDER — MULTIVITAMINS PO CAPS: 1.0000 | ORAL_CAPSULE | ORAL | Status: DC

## 2011-10-23 MED ORDER — LACTATED RINGERS IV SOLN
INTRAVENOUS | Status: DC | PRN
  Administered 2011-10-23 (×2): via INTRAVENOUS

## 2011-10-23 MED ORDER — ESTRADIOL 0.1 MG/GM VA CREA
TOPICAL_CREAM | VAGINAL | Status: AC
  Filled 2011-10-23: qty 42.5

## 2011-10-23 MED ORDER — HYDROMORPHONE HCL PF 1 MG/ML IJ SOLN: 0.2500 mg | INTRAMUSCULAR | Status: DC | PRN

## 2011-10-23 MED ORDER — LIP MEDEX EX OINT
1.0000 "application " | TOPICAL_OINTMENT | Freq: Two times a day (BID) | CUTANEOUS | Status: DC
  Administered 2011-10-23 – 2011-10-24 (×2): 1 via TOPICAL
  Filled 2011-10-23: qty 7

## 2011-10-23 MED ORDER — HYDROMORPHONE HCL PF 1 MG/ML IJ SOLN
INTRAMUSCULAR | Status: DC | PRN
  Administered 2011-10-23 (×4): 0.5 mg via INTRAVENOUS

## 2011-10-23 MED ORDER — ACETAMINOPHEN 325 MG PO TABS
650.0000 mg | ORAL_TABLET | Freq: Four times a day (QID) | ORAL | Status: DC
  Administered 2011-10-23 – 2011-10-24 (×3): 650 mg via ORAL
  Filled 2011-10-23 (×6): qty 2

## 2011-10-23 MED ORDER — FLORA-Q PO CAPS
1.0000 | ORAL_CAPSULE | Freq: Every day | ORAL | Status: DC
  Administered 2011-10-23 – 2011-10-24 (×2): 1 via ORAL
  Filled 2011-10-23 (×2): qty 1

## 2011-10-23 MED ORDER — ESTRADIOL 0.1 MG/GM VA CREA
TOPICAL_CREAM | VAGINAL | Status: DC | PRN
  Administered 2011-10-23: 1 via VAGINAL

## 2011-10-23 MED ORDER — HYDROMORPHONE HCL PF 1 MG/ML IJ SOLN
0.5000 mg | INTRAMUSCULAR | Status: DC | PRN
  Administered 2011-10-23 – 2011-10-24 (×2): 1 mg via INTRAVENOUS
  Filled 2011-10-23 (×2): qty 1

## 2011-10-23 MED ORDER — LIDOCAINE HCL (CARDIAC) 20 MG/ML IV SOLN
INTRAVENOUS | Status: DC | PRN
  Administered 2011-10-23: 30 mg via INTRAVENOUS

## 2011-10-23 MED ORDER — MAGIC MOUTHWASH
15.0000 mL | Freq: Four times a day (QID) | ORAL | Status: DC | PRN
  Filled 2011-10-23: qty 15

## 2011-10-23 MED ORDER — ACETAMINOPHEN 325 MG PO TABS: 650.0000 mg | ORAL_TABLET | Freq: Four times a day (QID) | ORAL | Status: DC | PRN

## 2011-10-23 MED ORDER — ADULT MULTIVITAMIN W/MINERALS CH
1.0000 | ORAL_TABLET | Freq: Every day | ORAL | Status: DC
  Administered 2011-10-24: 1 via ORAL
  Filled 2011-10-23: qty 1

## 2011-10-23 MED ORDER — DEXTROSE IN LACTATED RINGERS 5 % IV SOLN
INTRAVENOUS | Status: DC
  Administered 2011-10-23 – 2011-10-24 (×2): via INTRAVENOUS

## 2011-10-23 MED ORDER — EPHEDRINE SULFATE 50 MG/ML IJ SOLN
INTRAMUSCULAR | Status: DC | PRN
  Administered 2011-10-23: 10 mg via INTRAVENOUS

## 2011-10-23 MED ORDER — CISATRACURIUM BESYLATE 2 MG/ML IV SOLN
INTRAVENOUS | Status: DC | PRN
  Administered 2011-10-23: 1 mg via INTRAVENOUS
  Administered 2011-10-23: 6 mg via INTRAVENOUS
  Administered 2011-10-23 (×2): 2 mg via INTRAVENOUS

## 2011-10-23 SURGICAL SUPPLY — 94 items
ADH SKN CLS APL DERMABOND .7 (GAUZE/BANDAGES/DRESSINGS)
BAG URINE DRAINAGE (UROLOGICAL SUPPLIES) ×3 IMPLANT
BANDAGE GAUZE ELAST BULKY 4 IN (GAUZE/BANDAGES/DRESSINGS) ×1 IMPLANT
BLADE HEX COATED 2.75 (ELECTRODE) ×6 IMPLANT
BLADE SURG 15 STRL LF DISP TIS (BLADE) ×6 IMPLANT
BLADE SURG 15 STRL SS (BLADE) ×6
BLADE SURG SZ10 CARB STEEL (BLADE) ×4 IMPLANT
BRIEF STRETCH FOR OB PAD LRG (UNDERPADS AND DIAPERS) ×1 IMPLANT
CANISTER SUCTION 2500CC (MISCELLANEOUS) ×3 IMPLANT
CATH BIL BAL PROB 5FR (CATHETERS) IMPLANT
CATH BIL BAL PROB 6FR (CATHETERS) IMPLANT
CATH EMB 4FR 80CM (CATHETERS) IMPLANT
CATH EMB 5FR 80CM (CATHETERS) IMPLANT
CATH FOLEY 2WAY  3CC  8FR (CATHETERS)
CATH FOLEY 2WAY  3CC 10FR (CATHETERS)
CATH FOLEY 2WAY 3CC 10FR (CATHETERS) IMPLANT
CATH FOLEY 2WAY 3CC 8FR (CATHETERS) IMPLANT
CATH FOLEY 2WAY SLVR  5CC 14FR (CATHETERS) ×2
CATH FOLEY 2WAY SLVR  5CC 16FR (CATHETERS) ×1
CATH FOLEY 2WAY SLVR 5CC 14FR (CATHETERS) ×2 IMPLANT
CATH FOLEY 2WAY SLVR 5CC 16FR (CATHETERS) ×2 IMPLANT
CLOTH BEACON ORANGE TIMEOUT ST (SAFETY) ×6 IMPLANT
COVER MAYO STAND STRL (DRAPES) IMPLANT
DECANTER SPIKE VIAL GLASS SM (MISCELLANEOUS) ×6 IMPLANT
DERMABOND ADVANCED (GAUZE/BANDAGES/DRESSINGS)
DERMABOND ADVANCED .7 DNX12 (GAUZE/BANDAGES/DRESSINGS) IMPLANT
DEVICE CAPIO SUTURING (INSTRUMENTS)
DEVICE CAPIO SUTURING OPC (INSTRUMENTS) IMPLANT
DRAIN CHANNEL 15F RND FF 3/16 (WOUND CARE) ×1 IMPLANT
DRAIN PENROSE 18X1/4 LTX STRL (WOUND CARE) ×2 IMPLANT
DRAPE LAPAROTOMY T 102X78X121 (DRAPES) ×1 IMPLANT
DRAPE LG THREE QUARTER DISP (DRAPES) ×3 IMPLANT
DRESSING TELFA 8X3 (GAUZE/BANDAGES/DRESSINGS) ×1 IMPLANT
DRSG PAD ABDOMINAL 8X10 ST (GAUZE/BANDAGES/DRESSINGS) ×1 IMPLANT
DRSG TEGADERM 6X8 (GAUZE/BANDAGES/DRESSINGS) ×1 IMPLANT
DRSG TEGADERM 8X12 (GAUZE/BANDAGES/DRESSINGS) ×1 IMPLANT
ELECT REM PT RETURN 9FT ADLT (ELECTROSURGICAL) ×3
ELECTRODE REM PT RTRN 9FT ADLT (ELECTROSURGICAL) ×2 IMPLANT
EVACUATOR DRAINAGE 7X20 100CC (MISCELLANEOUS) IMPLANT
EVACUATOR SILICONE 100CC (MISCELLANEOUS) ×3
GAUZE PACKING 2X5 YD STERILE (GAUZE/BANDAGES/DRESSINGS) ×10 IMPLANT
GAUZE SPONGE 4X4 16PLY XRAY LF (GAUZE/BANDAGES/DRESSINGS) ×8 IMPLANT
GLOVE BIOGEL M STRL SZ7.5 (GLOVE) ×4 IMPLANT
GLOVE BIOGEL PI IND STRL 7.0 (GLOVE) ×2 IMPLANT
GLOVE BIOGEL PI INDICATOR 7.0 (GLOVE) ×1
GLOVE ECLIPSE 8.0 STRL XLNG CF (GLOVE) ×4 IMPLANT
GLOVE EUDERMIC 7 POWDERFREE (GLOVE) ×5 IMPLANT
GLOVE INDICATOR 8.0 STRL GRN (GLOVE) ×3 IMPLANT
GOWN STRL NON-REIN LRG LVL3 (GOWN DISPOSABLE) ×4 IMPLANT
GOWN STRL REIN XL XLG (GOWN DISPOSABLE) ×10 IMPLANT
HOLDER FOLEY CATH W/STRAP (MISCELLANEOUS) ×3 IMPLANT
KIT BASIN OR (CUSTOM PROCEDURE TRAY) ×6 IMPLANT
LEGGING LITHOTOMY PAIR STRL (DRAPES) ×1 IMPLANT
LOOP VESSEL MAXI BLUE (MISCELLANEOUS) ×2 IMPLANT
NDL MAYO 6 CRC TAPER PT (NEEDLE) ×2 IMPLANT
NDL SAFETY ECLIPSE 18X1.5 (NEEDLE) IMPLANT
NEEDLE HYPO 18GX1.5 SHARP (NEEDLE)
NEEDLE HYPO 22GX1.5 SAFETY (NEEDLE) ×3 IMPLANT
NEEDLE MAYO .5 CIRCLE (NEEDLE) IMPLANT
NEEDLE MAYO 6 CRC TAPER PT (NEEDLE) IMPLANT
PACK BASIC VI WITH GOWN DISP (CUSTOM PROCEDURE TRAY) ×3 IMPLANT
PACK CYSTO (CUSTOM PROCEDURE TRAY) ×3 IMPLANT
PENCIL BUTTON HOLSTER BLD 10FT (ELECTRODE) ×6 IMPLANT
PLUG CATH AND CAP STER (CATHETERS) ×3 IMPLANT
RETRACTOR STAY HOOK 5MM (MISCELLANEOUS) ×1 IMPLANT
SET BERKELEY SUCTION TUBING (SUCTIONS) ×1 IMPLANT
SHEET LAVH (DRAPES) ×3 IMPLANT
SPONGE GAUZE 4X4 12PLY (GAUZE/BANDAGES/DRESSINGS) ×3 IMPLANT
SPONGE SURGIFOAM ABS GEL 12-7 (HEMOSTASIS) IMPLANT
SUT CAPIO ETHIBPND (SUTURE) IMPLANT
SUT CAPIO POLYGLYCOLIC (SUTURE) IMPLANT
SUT CHROMIC 2 0 SH (SUTURE) IMPLANT
SUT CHROMIC 3 0 SH 27 (SUTURE) ×2 IMPLANT
SUT ETHIBOND 0 (SUTURE) ×2 IMPLANT
SUT SILK 2 0 SH (SUTURE) ×6 IMPLANT
SUT SILK 3 0 SH CR/8 (SUTURE) ×2 IMPLANT
SUT VIC AB 0 CT1 27 (SUTURE) ×6
SUT VIC AB 0 CT1 27XBRD ANTBC (SUTURE) ×4 IMPLANT
SUT VIC AB 2-0 CT1 27 (SUTURE) ×6
SUT VIC AB 2-0 CT1 27XBRD (SUTURE) ×4 IMPLANT
SUT VIC AB 2-0 SH 27 (SUTURE) ×54
SUT VIC AB 2-0 SH 27X BRD (SUTURE) ×26 IMPLANT
SUT VIC AB 2-0 UR6 27 (SUTURE) ×4 IMPLANT
SUT VIC AB 3-0 PS2 18 (SUTURE)
SUT VIC AB 3-0 PS2 18XBRD (SUTURE) IMPLANT
SUT VIC AB 3-0 SH 27 (SUTURE) ×3
SUT VIC AB 3-0 SH 27XBRD (SUTURE) ×6 IMPLANT
SUT VIC AB 4-0 RB1 27 (SUTURE)
SUT VIC AB 4-0 RB1 27XBRD (SUTURE) ×8 IMPLANT
SUT VIC AB 4-0 SH 18 (SUTURE) IMPLANT
SYRINGE 10CC LL (SYRINGE) ×4 IMPLANT
TOWEL OR 17X26 10 PK STRL BLUE (TOWEL DISPOSABLE) ×3 IMPLANT
WATER STERILE IRR 1500ML POUR (IV SOLUTION) ×3 IMPLANT
YANKAUER SUCT BULB TIP 10FT TU (MISCELLANEOUS) ×6 IMPLANT

## 2011-10-23 NOTE — Anesthesia Postprocedure Evaluation (Signed)
  Anesthesia Post-op Note  Patient: Emily Holloway  Procedure(s) Performed: Procedure(s) (LRB): REPAIR RECTO-VAGINAL FISSURE (N/A) FISTULA REPAIR VESICO-VAGINAL (N/A)  Patient Location: PACU  Anesthesia Type: General  Level of Consciousness: awake and alert   Airway and Oxygen Therapy: Patient Spontanous Breathing  Post-op Pain: mild  Post-op Assessment: Post-op Vital signs reviewed, Patient's Cardiovascular Status Stable, Respiratory Function Stable, Patent Airway and No signs of Nausea or vomiting  Post-op Vital Signs: stable  Complications: No apparent anesthesia complications

## 2011-10-23 NOTE — Op Note (Addendum)
NAME:  Emily Holloway, Emily Holloway NO.:  [id number]  MEDICAL RECORD NO.:  [id number]  LOCATION:  [facility]                         FACILITY:  [facility]  PHYSICIAN:  Ardeth Sportsman, MD     DATE OF BIRTH:  29-Sep-1955  DATE OF PROCEDURE:  10/23/2011 DATE OF DISCHARGE:                              OPERATIVE REPORT   PRIMARY CARE PHYSICIAN:  Dario Guardian, M.D.  GASTROENTEROLOGIST:  Iva Boop, MD,FACG.  OPERATING SURGEON:  Ardeth Sportsman, MD.  ASSISTANT:  Martina Sinner, MD.  Please see his separate note as this is a combined procedure for which I assisted him and he assisted me.  PREOPERATIVE DIAGNOSIS:  Recurrent distal rectovaginal fistula.  POSTOPERATIVE DIAGNOSIS:  Recurrent distal rectovaginal fistula.  PROCEDURES PERFORMED: 1. Redo rectovaginal fistula repair, transvaginal approach. 2. Martius bulbocavernosus, right, interposition flap     (see Dr. Mina Marble note). 3. Vaginoplasty to assist rectovaginal fistula repair and closure. 4. Anal dilatation.  ANESTHESIA: 1. General anesthesia. 2. Bilateral anorectal and pudendal nerve blocks with liposarcoma     bupivacaine.  SPECIMEN:  Rectovaginal fistulous tract   DRAIN: a 15-French Blake drain is in the right labia subcutaneous tissues.  ESTIMATED BLOOD LOSS:  200 mL.  INDICATIONS:  Ms. Biggers is a 56 year old female who had a very distal rectal cancer for which I did a laparoscopic low anterior resection with, hand-sewn coloanal anastomosis.  It was T2 N0 early stage.  She has no evidence of distant recurrence.  She underwent ileostomy takedown.  Developed a rectovaginal fistula after severe diarrhea.  She was rediverted and repaired transvaginally.  It fell apart.  She was repaired with a rectal advancement flap.  It was smaller, it is not completely closed after several months.  I had a consultation with Dr. Sherron Monday with [facility].  He agreed that the patient would benefit from an  interposition flap to help facilitate closure.  The technique of interposition graft using a bulbocavernosus muscle from labia was discussed.  Risks of recurrence was discussed.  Risk of stricturing, incontinence and other risks were discussed.  Questions answered.  She and her husband agreed to proceed.  OPERATIVE FINDINGS:  She had a very distal posterior midline rectovaginal fistula.  The orifice was about 2 mm in dianeter.  It was about 1-1/2 cm proximal to the introitus.  There was no evidence of any recurrence of the rectum.  DESCRIPTION OF PROCEDURE:  Informed consent was confirmed.  The patient underwent general anesthesia without any difficulty.  She was placed in high lithotomy.  Her perineum was prepped and draped in a sterile fashion.  Surgical time-out confirmed our plan.  We placed a Foley catheter.  We did an examination under anesthesia, now dilated a rectal stricture.  I was able to place a fistula probe transvaginally and find the rectovaginal fistula.  It was narrowed, but not completely closed.  We decided to proceed with a transvaginal approach.  Dr. Sherron Monday created a posterior vaginal wall advancement flap starting at the base of the fistula and continuing out superolaterally to have an inverse trapezoidal advancement flap going up about 4 cm by freeing the vagina off the rectovaginal septum.  Hemostasis was  ensured.  We did vaginoplasty by doing relaxing incisions on the rectovaginal septum off the inner pelvic wall using sharp dissection and focused cautery.  That helped for a release for the rectovaginal septum.  We also did some relaxing incisions at the skin and going from the introitus laterally out to the gluteal region posteriolaterally.  It released some tension.  I ended up sharply, trimming out the fistulous tract by taking a 2-0 silk stitch at the vaginal end of the fistulous opening.  Using a scalpel, I excised the fistulous tract down to the  rectum.  I sent that for pathology to make sure there was no evidence of recurrence or any other surprises.  I could see good rectal mucosa.  I could palpate the orifice.  The wound in the rectal side was about 1 cm.  I had excellent healthy tissue.  I saw no abnormal evidence of recurrence or evidence of any infection. There are no retained staples.  I closed the rectum transversely using 2-0 Vicryl interrupted stitches.  We did copious irrigation.  We tightened the internal sphincter & reapproximated the rectovaginal septum vertically using interrupted 2-0 Vicryl stitches. With the lateral vaginoplasty relaxing stitches, this closure was under little tension.  Next, we focused on the bulbocavernosus Martius flap.  See Dr. Mina Marble notes for details.  Essentially, he did a vertical incision over the right external labia and mobilized the bulbocavernosus pedicle and freed it from the superior end, keeping the inferior end intact.  We tunneled that from the labia, behind the lateral vaginal wall, and out to the posterior vaginal wound.  He secured the Martius flap onto the repair transversely using interrupted chromic stitches.  We did copious irrigation.  We ended up taking more of the vaginal posterior flap more proximally to help the flap reach more distally.  We reapproximated the vaginal closure on top of the pedicle flap using interrupted 2-0 Vicryl running stitches at the superolateral apices and coming down to the inferior part.  We also brought down the corners, so had somewhat of a stellate closure.  While there was moderate narrowing at the introitus, it was not severe.  Copious irrigation was done.  Closure was intact.  A drain was placed in the wound of the right labia & secured with silk stitch.  The labial wound was closed using interrupted 3-0 Vicryl stitches deeply and running 4-0 Monocryl subcuticular stitches.    I did inspection, hemostasis was excellent.  The  patient was extubated, sent to recovery room.  I discussed postop care with the patient and her husband already prior to surgery.  I had a good discussion with the patient's husband about the operative findings. He is hopeful as are Dr. Sherron Monday & I, that this repair will help the area looked. His questions answered. He expressed understanding and appreciation  Ardeth Sportsman, MD     SCG/MEDQ  D:  10/23/2011  T:  10/23/2011  Job:  [id number]  cc:   Dario Guardian, M.D. Fax: [phone]  Martina Sinner, MD Fax: [phone]  Iva Boop, MD,FACG [facility] [address] [place], [place] [address]

## 2011-10-23 NOTE — Brief Op Note (Signed)
10/23/2011  10:53 AM  PATIENT:  Emily Holloway  56 y.o. female  Patient Care Team: Candace Darolyn Rua, MD as PCP - General (Family Medicine) Iva Boop, MD as Consulting Physician (Gastroenterology) Ardeth Sportsman, MD (General Surgery) Martina Sinner, MD as Consulting Physician (Urology)  PRE-OPERATIVE DIAGNOSIS:  recurrent rectovaginal fistula   POST-OPERATIVE DIAGNOSIS:  recurrent rectovaginal fistula   PROCEDURE:  Procedure(s) (LRB):  *REPAIR RECTO-VAGINAL Fistula *Anal dilitation  **MARTIUS bulbcavernosus interposition flap **Vaginoplasty  SURGEON:  Surgeon(s) and Role: *Panel 1:     - Ardeth Sportsman, MD - Primary     - Martina Sinner, MD - Asst  **Panel 2:  -Martina Sinner, MD - Primary  -Estelle Grumbles - Asst  ANESTHESIA:   local and general  EBL:  Total I/O In: 1000 [I.V.:1000] Out: 300 [Blood:300]  BLOOD ADMINISTERED:none  DRAINS: 15Fr Blake drain in R labia   LOCAL MEDICATIONS USED:  Amount: 20mL diluted in 60mL liposomal bupivicaine  SPECIMEN:  Source of Specimen:  Rectovaginal fistula tract  DISPOSITION OF SPECIMEN:  PATHOLOGY  COUNTS:  YES  TOURNIQUET:  * No tourniquets in log *  DICTATION: .Other Dictation: Dictation Number [id number]  PLAN OF CARE: Admit for overnight observation  PATIENT DISPOSITION:  PACU - hemodynamically stable.   Delay start of Pharmacological VTE agent (>24hrs) due to surgical blood loss or risk of bleeding: no

## 2011-10-23 NOTE — Op Note (Signed)
Preoperative diagnosis: Rectovaginal fistula Postoperative diagnosis rectovaginal fistula Surgery: Interposition of Martius flap and vaginoplasty Surgeon: Dr. Lorin Picket Dalanie Kisner Asst.: Dr. Karie Soda  Dr. gross performed a repair of a rectovaginal fistula with my assistance. I initially identified the fistula 2 cm proximal to the introitus at 6:00. She had a very narrow vagina and I therefore did two releasing incisions one at 5:00 and the second at 7:00. I did an inverted vaginal wall flap with a broad base with the base in a cephalad direction. I circumscribed the vaginal flap leaving an [place] of vaginal wall mucosa between the fistula in the perineal body at 6:00 in the midline  At this point Dr. gross performed the repair of the rectovaginal fascia closing in 2 layers tension-free  Labial fat pad was then procured. The double ring retractor with small clots was removed. I made a lengthy incision over the right labia majora and dissected down through the subcutaneous tissue from globular fat to the interface with the non-lobular fat with the surrounding sheath. I was able to easily mobilize a healthy Mardis flaps circumferentially at its tip and most cephalad two thirds leaving good supply inferiorly laterally and medially. The flap was mobilized to the level of the muscles overlying the pubic bones.  Due to vaginal mobilization laterally it was quite easy to develop a small opening between the labial dissection and the vaginal wall dissection. I passed a small vesicle. Using cautery I then enlarged the hole the size of my finger.  I trimmed the size of the labial fat pad to accommodate her small narrow vagina and repair. I passed a 3-0 Vicryl suture to the tip of the pad and delivered it into the vaginal area. Without being under tension it was sutured over the repair laterally on the patient's left side with interrupted 3-0 chromic. I used a 3-0 chromic at the lower third of the fat pad to sew it  to it the underlying muscles to stabilize it in the labial region and help maximize her normal anatomy.  The vaginoplasty was performed as described above with releasing incisions and lateral mobilization of the pelvic sidewall vaginal mucosa and flap. I was able to close all vaginal incisions overlying the Martius flap.  Number not surprising she had vaginal narrowing approximately the same as she had preoperatively. A Jackson-Pratt #15 drain was brought through a stab incision in the right labial skin and sutured with 2-0 silk suture. 3-0 Vicryl was used for the subcutaneous tissue and 4-0 Monocryl for the skin. Pressure dressing was applied with mesh pants. Copious irrigation was used throughout the case. Gentle vaginal pack with Estrace cream was inserted.  We were very pleased with the multilayer closure 2 layers on the rectal side in combination with a fat pad in combination with a thick vaginal wall flap. Hopefully the operation will reach the patient's treatment goal

## 2011-10-23 NOTE — Progress Notes (Signed)
Ostomy appliance intact.

## 2011-10-23 NOTE — H&P (Signed)
Emily Holloway  [birth year] [id number]  Patient Care Team: Allean Found, MD as PCP - General (Family Medicine) Iva Boop, MD as Consulting Physician (Gastroenterology) Ardeth Sportsman, MD (General Surgery) Martina Sinner, MD as Consulting Physician (Urology)  This patient is a 56 y.o.female who presents today for surgical evaluation.   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 She completed antibiotics. For the past month, however, once a week she gets some pelvic/rectal cramping and a little change in her vaginal drainage. She worries that it is rectal drainage again. No fevers or chills.  She continues to empty the ileostomy about 6 times a day. She'll she empties it when it's on the lesser halfway full.  She denies any rectal drainage or R. rectal bleeding.   Still w fistula distal.  Seen w Urology.  Plan reconstructive repair with Martius flap    Patient Active Problem List  Diagnoses  . Rectovaginal fistula  . History of rectal cancer, T2N0, Nov2010  . Vaginal Discharge    Past Medical History  Diagnosis Date  . Rectovaginal fistula   . Rectal carcinoma     Past Surgical History  Procedure Date  . Colon resection 2010    COLON CANCER  . Ostomy take down 2011  . Ostomy placement 2011    with RV fistula repair  . Tubal ligation   . Rectovaginal fistula closure [id number], [id number]  . Colonoscopy w/ biopsies and polypectomy 04/09/2009    adenocarcinoma  . Eus 05/03/2009    rectal adenocarcinoma    History   Social History  . Marital Status: Married    Spouse Name: N/A    Number of Children: N/A  . Years of Education: N/A   Occupational History  . Not on file.   Social History Main Topics  . Smoking status: Former Smoker -- 1.0 packs/day for 35 years  . Smokeless tobacco: Never Used   Comment: 2010 QUIT SMOKING  . Alcohol Use: No  . Drug Use: No  . Sexually Active:    Other Topics  Concern  . Not on file   Social History Narrative  . No narrative on file    Family History  Problem Relation Age of Onset  . Hypertension Mother   . Cancer Father   . Diabetes Father   . Cancer Brother     Current Facility-Administered Medications  Medication Dose Route Frequency Provider Last Rate Last Dose  . ertapenem (INVANZ) 1 g in sodium chloride 0.9 % 50 mL IVPB  1 g Intravenous 60 min Pre-Op Ardeth Sportsman, MD       Facility-Administered Medications Ordered in Other Encounters  Medication Dose Route Frequency Provider Last Rate Last Dose  . acetaminophen (OFIRMEV) IV    PRN Valeda Malm, CRNA   1,000 mg at 10/23/11 0645  . fentaNYL (SUBLIMAZE) injection    PRN Valeda Malm, CRNA   25 mcg at 10/23/11 0715  . lactated ringers infusion    Continuous PRN Valeda Malm, CRNA      . midazolam (VERSED) 5 MG/5ML injection    PRN Valeda Malm, CRNA   1 mg at 10/23/11 0715  . ondansetron (ZOFRAN) injection    PRN Valeda Malm, CRNA   2 mg at 10/23/11 0715     Allergies  Allergen Reactions  . Sulfonamide Derivatives     REACTION: itching    BP 123/86  Pulse 91  Temp(Src) 99.4 F (37.4 C) (Oral)  Resp 18  SpO2 98%     Review of Systems  Constitutional: Negative for fever, chills, diaphoresis, appetite change and fatigue.  HENT: Negative for ear pain, sore throat, trouble swallowing, neck pain and ear discharge.  Eyes: Negative for photophobia, discharge and visual disturbance.  Respiratory: Negative for cough, choking, chest tightness and shortness of breath.  Cardiovascular: Negative for chest pain and palpitations.  Gastrointestinal: Negative for nausea, vomiting, abdominal pain, diarrhea, constipation, anal bleeding and rectal pain.  Genitourinary: Positive for vaginal discharge. Negative for dysuria, urgency, frequency, hematuria, decreased urine volume, vaginal bleeding, difficulty urinating, vaginal pain and menstrual problem.    Musculoskeletal: Negative for myalgias and gait problem.  Skin: Negative for color change, pallor and rash.  Neurological: Negative for dizziness, speech difficulty, weakness and numbness.  Hematological: Negative for adenopathy.  Psychiatric/Behavioral: Negative for confusion and agitation. The patient is not nervous/anxious.  Objective:   Physical Exam  Constitutional: She is oriented to person, place, and time. She appears well-developed and well-nourished. No distress.  HENT:  Head: Normocephalic.  Mouth/Throat: Oropharynx is clear and moist. No oropharyngeal exudate.  Eyes: Conjunctivae and EOM are normal. Pupils are equal, round, and reactive to light. No scleral icterus.  Neck: Normal range of motion. No tracheal deviation present.  Cardiovascular: Normal rate and intact distal pulses.  Pulmonary/Chest: Effort normal. No respiratory distress. She exhibits no tenderness.  Abdominal: Soft. She exhibits no distension. There is no tenderness. Hernia confirmed negative in the right inguinal area and confirmed negative in the left inguinal area.  Incisions clean with normal healing ridges. No hernias  Genitourinary: Vaginal discharge found.  Posterior vaginal wall scarred. Small fistula detected. No wounds found. No fluctuance. Scant vaginal drainage - seems physiologic & not foul/bloody/grey  Perianal skin clean with good hygiene. No pruritis. No pilonidal disease. No fissure. No abscess/fistula. I did not do a rectal exam    Musculoskeletal: Normal range of motion. She exhibits no tenderness.  Lymphadenopathy:  Right: No inguinal adenopathy present.  Left: No inguinal adenopathy present.  Neurological: She is alert and oriented to person, place, and time. No cranial nerve deficit. She exhibits normal muscle tone. Coordination normal.  Skin: Skin is warm and dry. No rash noted. She is not diaphoretic.  Psychiatric: She has a normal mood and affect. Her behavior is normal.   Assessment:   Chronic RV fistula - at point of minimal size.  Need redo repair to be closed down   Plan:   The anatomy & physiology of the digestive tract was discussed.  The pathophysiology of rectovaginal fistula was discussed.  Natural history risks without surgery was discussed. I worked to give an overview of the disease and the frequent need to have multispecialty involvement.   I feel the risks of no intervention will lead to serious problems that outweigh the operative risks; therefore, I recommended surgery to treat the pathology.  Perineal approach with advancement flap for low fistulas was discussed.   Laparoscopic & open techniques for possible partial proctocolectomy for higher rectovaginal fistulas were discussed.  Repair of the vaginal opening with possible need for intervening material to prevent recurrence such as omentum, muscle, mesh, etc was discussed.  Possible fecal diversion by ostomy was discussed.  We will work to preserve anal & pelvic floor function without sacrificing cure.  Risks such as bleeding, infection, abscess, leak, recurrence with reoperation, possible ostomy, hernia, heart attack, death, and other risks were discussed.  I noted  a good likelihood this will help address the problem.   Goals of post-operative recovery were discussed as well.  We will work to minimize complications.  An educational handout on the pathology was given as well.  Questions were answered.    The patient expresses understanding & wishes to proceed with surgery.   If those workups are negative, consider examination under anesthesia. If there is no evidence of fistula or recurrence, then plan ileostomy takedown.  The anatomy & physiology of the digestive tract was discussed. The pathophysiology was discussed. Possibility of remaining with an ostomy permanently was discussed. I offered ostomy takedown. Techniques were discussed. Risks such as bleeding, infection, abscess, leak, reoperation,  possible re-ostomy, injury to other organs, hernia, heart attack, death, and other risks were discussed. Goals of post-operative recovery were discussed as well. We will work to minimize complications. Questions were answered. The patient expresses understanding & wishes to proceed with surgery.

## 2011-10-23 NOTE — Transfer of Care (Signed)
Immediate Anesthesia Transfer of Care Note  Patient: Emily Holloway  Procedure(s) Performed: Procedure(s) (LRB): REPAIR RECTO-VAGINAL FISSURE (N/A) FISTULA REPAIR VESICO-VAGINAL (N/A)  Patient Location: PACU  Anesthesia Type: General  Level of Consciousness: awake and alert   Airway & Oxygen Therapy: Patient Spontanous Breathing and Patient connected to face mask oxygen  Post-op Assessment: Report given to PACU RN and Post -op Vital signs reviewed and stable  Post vital signs: Reviewed and stable  Complications: No apparent anesthesia complications

## 2011-10-23 NOTE — Progress Notes (Signed)
Existing colostomy from previous surgery

## 2011-10-23 NOTE — Interval H&P Note (Signed)
History and Physical Interval Note:  10/23/2011 7:37 AM  Emily Holloway  has presented today for surgery, with the diagnosis of recurrent rectovaginal fistula   The various methods of treatment have been discussed with the patient and family. After consideration of risks, benefits and other options for treatment, the patient has consented to  Procedure(s) (LRB): REPAIR RECTO-VAGINAL FISTULA as a surgical intervention .  The patients' history has been reviewed, patient examined, no change in status, stable for surgery.  I have reviewed the patients' chart and labs.  Questions were answered to the patient's satisfaction.     Darika Ildefonso C.

## 2011-10-24 LAB — CBC
Hemoglobin: 8.7 g/dL — ABNORMAL LOW (ref 12.0–15.0)
MCH: 30.7 pg (ref 26.0–34.0)
MCV: 95.8 fL (ref 78.0–100.0)
Platelets: 296 10*3/uL (ref 150–400)
RBC: 2.83 MIL/uL — ABNORMAL LOW (ref 3.87–5.11)
WBC: 14.8 10*3/uL — ABNORMAL HIGH (ref 4.0–10.5)

## 2011-10-24 LAB — BASIC METABOLIC PANEL
CO2: 28 mEq/L (ref 19–32)
Calcium: 8.2 mg/dL — ABNORMAL LOW (ref 8.4–10.5)
GFR calc non Af Amer: >90 mL/min (ref >90)
Glucose, Bld: 121 mg/dL — ABNORMAL HIGH (ref 70–99)
Potassium: 3.5 mEq/L (ref 3.5–5.1)
Sodium: 138 mEq/L (ref 135–145)

## 2011-10-24 MED ORDER — AMOXICILLIN-POT CLAVULANATE 875-125 MG PO TABS: 1.0000 | ORAL_TABLET | Freq: Two times a day (BID) | ORAL | Status: AC

## 2011-10-24 MED ORDER — HYDROMORPHONE HCL 2 MG PO TABS
2.0000 mg | ORAL_TABLET | ORAL | Status: DC | PRN
  Administered 2011-10-24 (×2): 4 mg via ORAL
  Filled 2011-10-24 (×2): qty 2

## 2011-10-24 MED ORDER — SODIUM CHLORIDE 0.9 % IJ SOLN
3.0000 mL | Freq: Two times a day (BID) | INTRAMUSCULAR | Status: DC
  Administered 2011-10-24: 3 mL via INTRAVENOUS

## 2011-10-24 MED ORDER — HYDROCORTISONE ACE-PRAMOXINE 2.5-1 % RE CREA
1.0000 "application " | TOPICAL_CREAM | Freq: Four times a day (QID) | RECTAL | Status: DC | PRN
  Filled 2011-10-24: qty 30

## 2011-10-24 MED ORDER — HYDROMORPHONE HCL PF 1 MG/ML IJ SOLN: 0.5000 mg | INTRAMUSCULAR | Status: DC | PRN

## 2011-10-24 MED ORDER — SODIUM CHLORIDE 0.9 % IJ SOLN: 3.0000 mL | INTRAMUSCULAR | Status: DC | PRN

## 2011-10-24 MED ORDER — NAPROXEN 500 MG PO TABS
500.0000 mg | ORAL_TABLET | Freq: Two times a day (BID) | ORAL | Status: DC
  Administered 2011-10-24: 500 mg via ORAL
  Filled 2011-10-24 (×3): qty 1

## 2011-10-24 MED ORDER — HYDROMORPHONE HCL 4 MG PO TABS: 2.0000 mg | ORAL_TABLET | ORAL | Status: AC | PRN

## 2011-10-24 NOTE — Discharge Summary (Signed)
Physician Discharge Summary  Patient ID: Emily Holloway MRN: [id number] DOB/AGE: Nov 29, 1955 56 y.o.  Admit date: 10/23/2011 Discharge date: 10/24/2011  Patient Care Team: Candace Darolyn Rua, MD as PCP - General (Family Medicine) Iva Boop, MD as Consulting Physician (Gastroenterology) Ardeth Sportsman, MD (General Surgery) Martina Sinner, MD as Consulting Physician (Urology)  Admission Diagnoses: Patient Active Problem List  Diagnoses  . Rectovaginal fistula  . History of rectal cancer, T2N0, Nov2010  . Vaginal Discharge    Discharge Diagnoses:  Principal Problem:  *Rectovaginal fistula Active Problems:  History of rectal cancer, T2N0, Nov2010   Discharged Condition: good  Hospital Course: Patient underwent re\re repair of her recurrent rectovaginal fistula. We used an interposition bulbocavernosus flap. Postoperatively, she was transitioned from IV to oral pain medications. She was transitioned to oral antibiotics. She made improvement. By the time of discharge she was walking on the hallways, and good pain control, was urinating fine. We felt safe for her to be discharged home  Consults: urology (Dr. Sherron Monday)  Significant Diagnostic Studies: None  Treatments: surgery: Redo RV fistula repair with pedicle (Martius) flap, vaginoplasty  Discharge Exam: Blood pressure 103/71, pulse 86, temperature 98 F (36.7 C), temperature source Oral, resp. rate 18, height 5\' 2"  (1.575 m), weight 165 lb 2 oz (74.9 kg), SpO2 98.00%.  General: Pt awake/alert/oriented x4 in no major acute distress Eyes: PERRL, normal EOM. Sclera nonicteric Neuro: CN II-XII intact w/o focal sensory/motor deficits. Lymph: No head/neck/groin lymphadenopathy Psych:  No delerium/psychosis/paranoia HENT: Normocephalic, Mucus membranes moist.  No thrush Neck: Supple, No tracheal deviation Chest: No pain.  Good respiratory excursion. CV:  Pulses intact.  Regular rhythm Abdomen: Soft, Nondistended.   Nontender.  No incarcerated hernias. Mild drainage at perineum.  Drain serosanguinous Ext:  SCDs BLE.  No significant edema.  No cyanosis Skin: No petechiae / purpurae   Disposition: 01-Home or Self Care  Discharge Orders    Future Orders Please Complete By Expires   Diet - low sodium heart healthy      Increase activity slowly        Medication List  As of 10/24/2011  8:18 AM   TAKE these medications         amoxicillin 500 MG capsule   Commonly known as: AMOXIL   Take 500 mg by mouth 3 (three) times daily. As needed for flare ups with colon      CALCIUM SOFT CHEWS PO   Take 600 Units by mouth 2 (two) times daily.      HYDROmorphone 4 MG tablet   Commonly known as: DILAUDID   Take 0.5-2 tablets (2-8 mg total) by mouth every 4 (four) hours as needed for pain.      multivitamin capsule   Take 1 capsule by mouth every morning.      PYCNOGENOL PO   Take 50 mg by mouth every morning.      traMADol 50 MG tablet   Commonly known as: ULTRAM   Take 1 tablet (50 mg total) by mouth every 6 (six) hours as needed for pain.             Signed: Jais Demir C. 10/24/2011, 8:18 AM

## 2011-10-24 NOTE — Progress Notes (Signed)
Foley cath and vaginal packing removed with no problems.  Pt experienced pain afterward and requested PRN.

## 2011-10-24 NOTE — Discharge Instructions (Signed)
ANORECTAL SURGERY: POST OP INSTRUCTIONS  1. Take your usually prescribed home medications unless otherwise directed. 2. DIET: Follow a light bland diet the first 24 hours after arrival home, such as soup, liquids, crackers, etc.  Be sure to include lots of fluids daily.  Avoid fast food or heavy meals as your are more likely to get nauseated.  Eat a low fat the next few days after surgery.   3. PAIN CONTROL: a. Pain is best controlled by a usual combination of three different methods TOGETHER: i. Ice/Heat ii. Over the counter pain medication iii. Prescription pain medication b. Most patients will experience some swelling and discomfort in the anus/rectal area. and incisions.  Ice packs or heat (30-60 minutes up to 6 times a day) will help. Use ice for the first few days to help decrease swelling and bruising, then switch to heat such as warm towels, sitz baths, warm baths, etc to help relax tight/sore spots and speed recovery.  Some people prefer to use ice alone, heat alone, alternating between ice & heat.  Experiment to what works for you.  Swelling and bruising can take several weeks to resolve.   c. It is helpful to take an over-the-counter pain medication regularly for the first few weeks.  Choose one of the following that works best for you: i. Naproxen (Aleve, etc)  Two 220mg tabs twice a day ii. Ibuprofen (Advil, etc) Three 200mg tabs four times a day (every meal & bedtime) iii. Acetaminophen (Tylenol, etc) 500-650mg four times a day (every meal & bedtime) d. A  prescription for pain medication (such as oxycodone, hydrocodone, etc) should be given to you upon discharge.  Take your pain medication as prescribed.  i. If you are having problems/concerns with the prescription medicine (does not control pain, nausea, vomiting, rash, itching, etc), please call us [phone] to see if we need to switch you to a different pain medicine that will work better for you and/or control your side effect  better. ii. If you need a refill on your pain medication, please contact your pharmacy.  They will contact our office to request authorization. Prescriptions will not be filled after 5 pm or on week-ends. 4. KEEP YOUR BOWELS REGULAR a. The goal is one bowel movement a day b. Avoid getting constipated.  Between the surgery and the pain medications, it is common to experience some constipation.  Increasing fluid intake and taking a fiber supplement (such as Metamucil, Citrucel, FiberCon, MiraLax, etc) 1-2 times a day regularly will usually help prevent this problem from occurring.  A mild laxative (prune juice, Milk of Magnesia, MiraLax, etc) should be taken according to package directions if there are no bowel movements after 48 hours. c. Watch out for diarrhea.  If you have many loose bowel movements, simplify your diet to bland foods & liquids for a few days.  Stop any stool softeners and decrease your fiber supplement.  Switching to mild anti-diarrheal medications (Kayopectate, Pepto Bismol) can help.  If this worsens or does not improve, please call us.  5. Wound Care a. Remove your bandages the day after surgery.  Unless discharge instructions indicate otherwise, leave your bandage dry and in place overnight.  Remove the bandage during your first bowel movement.   b. Allow the wound packing to fall out over the next few days.  You can trim exposed gauze / ribbon as it falls out.  You do not need to repack the wound unless instructed otherwise.  Wear an   absorbent pad or soft cotton gauze in your underwear as needed to catch any drainage and help keep the area  c. Keep the area clean and dry.  Bathe / shower every day.  Keep the area clean by showering / bathing over the incision / wound.   It is okay to soak an open wound to help wash it.  Wet wipes or showers / gentle washing after bowel movements is often less traumatic than regular toilet paper. d. You may have some styrofoam-like soft packing in  the rectum which will come out with the first bowel movement.  e. You will often notice bleeding with bowel movements.  This should slow down by the end of the first week of surgery f. Expect some drainage.  This should slow down, too, by the end of the first week of surgery.  Wear an absorbent pad or soft cotton gauze in your underwear until the drainage stops. 6. ACTIVITIES as tolerated:   a. You may resume regular (light) daily activities beginning the next day--such as daily self-care, walking, climbing stairs--gradually increasing activities as tolerated.  If you can walk 30 minutes without difficulty, it is safe to try more intense activity such as jogging, treadmill, bicycling, low-impact aerobics, swimming, etc. b. Save the most intensive and strenuous activity for last such as sit-ups, heavy lifting, contact sports, etc  Refrain from any heavy lifting or straining until you are off narcotics for pain control.   c. DO NOT PUSH THROUGH PAIN.  Let pain be your guide: If it hurts to do something, don't do it.  Pain is your body warning you to avoid that activity for another week until the pain goes down. d. You may drive when you are no longer taking prescription pain medication, you can comfortably sit for long periods of time, and you can safely maneuver your car and apply brakes. e. You may have sexual intercourse when it is comfortable.  7. FOLLOW UP in our office a. Please call [facility] at [phone] to set up an appointment to see your surgeon in the office for a follow-up appointment approximately 2 weeks after your surgery. b. Make sure that you call for this appointment the day you arrive home to insure a convenient appointment time. 10. IF YOU HAVE DISABILITY OR FAMILY LEAVE FORMS, BRING THEM TO THE OFFICE FOR PROCESSING.  DO NOT GIVE THEM TO YOUR DOCTOR.        WHEN TO CALL US [phone]: 1. Poor pain control 2. Reactions / problems with new medications (rash/itching, nausea,  etc)  3. Fever over 101.5 F (38.5 C) 4. Inability to urinate 5. Nausea and/or vomiting 6. Worsening swelling or bruising 7. Continued bleeding from incision. 8. Increased pain, redness, or drainage from the incision  The clinic staff is available to answer your questions during regular business hours (8:30am-5pm).  Please don't hesitate to call and ask to speak to one of our nurses for clinical concerns.   A surgeon from [facility] is always on call at the hospitals   If you have a medical emergency, go to the nearest emergency room or call 911.    [facility] [address], [address], [place], [place]  [address] ? MAIN: [phone] ? TOLL FREE: [phone] ? FAX [phone] [email or web address]    

## 2011-10-24 NOTE — Progress Notes (Signed)
Emily Holloway 1955/12/29 [id number]  PCP: Allean Found, MD, MD Outpatient Care Team: Patient Care Team: Candace Darolyn Rua, MD as PCP - General (Family Medicine) Iva Boop, MD as Consulting Physician (Gastroenterology) Ardeth Sportsman, MD (General Surgery) Martina Sinner, MD as Consulting Physician (Urology)  Inpatient Treatment Team: Treatment Team: Attending Provider: Ardeth Sportsman, MD; Surgeon: Martina Sinner, MD; Registered Nurse: Bethann Goo, RN; Registered Nurse: Janit Bern, RN; Registered Nurse: Horton Marshall  Subjective:  Using IV dilaudid Walking in hallways No n/v/gerd yet  Objective:  Vital signs:  Temp:  [97.3 F (36.3 C)-98.3 F (36.8 C)] 98 F (36.7 C) (03/29 0600) Pulse Rate:  [62-86] 86  (03/29 0600) Resp:  [9-20] 18  (03/29 0600) BP: (94-121)/(53-75) 103/71 mmHg (03/29 0600) SpO2:  [98 %-100 %] 98 % (03/29 0600) Weight:  [165 lb 2 oz (74.9 kg)] 165 lb 2 oz (74.9 kg) (03/28 1424) Last BM Date: 10/23/11  Intake/Output   Yesterday:  03/28 0701 - 03/29 0700 In: 3700 [P.O.:600; I.V.:2600; IV Piggyback:500] Out: 3510 [Urine:2800; Drains:60; Stool:350; Blood:300] This shift:     Bowel function:  Flatus: y  BM: y  Physical Exam:  General: Pt awake/alert/oriented x4 in no acute distress Eyes: PERRL, normal EOM.  Sclera clear.  No icterus Neuro: CN II-XII intact w/o focal sensory/motor deficits. Lymph: No head/neck/groin lymphadenopathy Psych:  No delerium/psychosis/paranoia HENT: Normocephalic, Mucus membranes moist.  No thrush Neck: Supple, No tracheal deviation Chest: clear No chest wall pain w good excursion CV:  Pulses intact.  Regular rhythm Abdomen: Soft.  Nondistended.  Mildly tender at perineal incisions only.  No incarcerated hernias. Ext:  SCDs BLE.  No mjr edema.  No cyanosis Skin: No petechiae / purpurae  Results:   Labs: Results for orders placed during the hospital encounter of 10/23/11  (from the past 48 hour(s))  CBC     Status: Abnormal   Collection Time   10/23/11  3:12 PM      Component Value Range Comment   WBC 11.2 (*) 4.0 - 10.5 (K/uL)    RBC 3.14 (*) 3.87 - 5.11 (MIL/uL)    Hemoglobin 9.6 (*) 12.0 - 15.0 (g/dL)    HCT 40.9 (*) 81.1 - 46.0 (%)    MCV 96.2  78.0 - 100.0 (fL)    MCH 30.6  26.0 - 34.0 (pg)    MCHC 31.8  30.0 - 36.0 (g/dL)    RDW 91.4  78.2 - 95.6 (%)    Platelets 192  150 - 400 (K/uL)   CREATININE, SERUM     Status: Normal   Collection Time   10/23/11  3:12 PM      Component Value Range Comment   Creatinine, Ser 0.57  0.50 - 1.10 (mg/dL)    GFR calc non Af Amer >90  >90 (mL/min)    GFR calc Af Amer >90  >90 (mL/min)   BASIC METABOLIC PANEL     Status: Abnormal   Collection Time   10/24/11  4:39 AM      Component Value Range Comment   Sodium 138  135 - 145 (mEq/L)    Potassium 3.5  3.5 - 5.1 (mEq/L)    Chloride 105  96 - 112 (mEq/L)    CO2 28  19 - 32 (mEq/L)    Glucose, Bld 121 (*) 70 - 99 (mg/dL)    BUN 5 (*) 6 - 23 (mg/dL)    Creatinine, Ser 2.13  0.50 - 1.10 (mg/dL)  Calcium 8.2 (*) 8.4 - 10.5 (mg/dL)    GFR calc non Af Amer >90  >90 (mL/min)    GFR calc Af Amer >90  >90 (mL/min)   CBC     Status: Abnormal   Collection Time   10/24/11  4:39 AM      Component Value Range Comment   WBC 14.8 (*) 4.0 - 10.5 (K/uL)    RBC 2.83 (*) 3.87 - 5.11 (MIL/uL)    Hemoglobin 8.7 (*) 12.0 - 15.0 (g/dL)    HCT 02.7 (*) 25.3 - 46.0 (%)    MCV 95.8  78.0 - 100.0 (fL)    MCH 30.7  26.0 - 34.0 (pg)    MCHC 32.1  30.0 - 36.0 (g/dL)    RDW 66.4  40.3 - 47.4 (%)    Platelets 296  150 - 400 (K/uL)     Imaging / Studies: No results found.  Medications / Allergies: per chart  Antibiotics: Anti-infectives     Start     Dose/Rate Route Frequency Ordered Stop   10/23/11 1600  amoxicillin-clavulanate (AUGMENTIN) 875-125 MG per tablet 1 tablet       1 tablet Oral Every 12 hours 10/23/11 1441 11/02/11 2159   10/23/11 1029   polymyxin B 500,000  Units, bacitracin 50,000 Units in sodium chloride irrigation 0.9 % 500 mL irrigation  Status:  Discontinued          As needed 10/23/11 1029 10/23/11 1050   10/23/11 0601   ertapenem (INVANZ) 1 g in sodium chloride 0.9 % 50 mL IVPB        1 g 100 mL/hr over 30 Minutes Intravenous 60 min pre-op 10/23/11 0601 10/23/11 0745          Assessment  Emily Holloway  56 y.o. female  1 Day Post-Op  Procedure(s): REPAIR RECTO-VAGINAL FISSURE FISTULA REPAIR VESICO-VAGINAL  Problem List:  Principal Problem:  *Rectovaginal fistula Active Problems:  History of rectal cancer, T2N0, Nov2010   Stable  Plan: -poss d/c later today if has better PO pain control -d/c vag packing -d/c foley -po abx for proctitis x7 days post-op -f/u path -cont drain until output low - prob d/c in office in 1-2 weeks -VTE prophylaxis- SCDs, etc -mobilize as tolerated to help recovery  Ardeth Sportsman, M.D., F.A.C.S. Gastrointestinal and Minimally Invasive Surgery [facility] [address] [place], [place] [address] [phone] Main / Paging [phone] Voice Mail   10/24/2011

## 2011-10-27 ENCOUNTER — Telehealth (INDEPENDENT_AMBULATORY_CARE_PROVIDER_SITE_OTHER): Payer: Self-pay | Admitting: General Surgery

## 2011-10-27 NOTE — Telephone Encounter (Signed)
Spoke with patient about follow up, her JP drain and antibiotics. Appt made for her to follow up with Dr Michaell Cowing on Monday due to JP drain.

## 2011-10-27 NOTE — Telephone Encounter (Signed)
Message copied by Liliana Cline on Mon Oct 27, 2011  2:16 PM ------      Message from: Marnette Burgess      Created: Mon Oct 27, 2011  9:05 AM       Has a non urgent question, please call.Marland KitchenMarland Kitchen

## 2011-10-31 ENCOUNTER — Encounter (HOSPITAL_COMMUNITY): Payer: Self-pay | Admitting: Surgery

## 2011-11-03 ENCOUNTER — Encounter (INDEPENDENT_AMBULATORY_CARE_PROVIDER_SITE_OTHER): Payer: Self-pay | Admitting: Surgery

## 2011-11-03 ENCOUNTER — Ambulatory Visit (INDEPENDENT_AMBULATORY_CARE_PROVIDER_SITE_OTHER): Payer: 59 | Admitting: Surgery

## 2011-11-03 VITALS — BP 112/70 | HR 104 | Temp 99.0°F | Resp 16 | Ht 62.0 in | Wt 161.4 lb

## 2011-11-03 DIAGNOSIS — N824 Other female intestinal-genital tract fistulae: Secondary | ICD-10-CM

## 2011-11-03 DIAGNOSIS — N823 Fistula of vagina to large intestine: Secondary | ICD-10-CM

## 2011-11-03 NOTE — Progress Notes (Signed)
Subjective:     Patient ID: Emily Holloway, female   DOB: 09-21-1955, 56 y.o.   MRN: [id number]  HPI  Emily Holloway  1956/06/15 [id number]  Patient Care Team: Allean Found, MD as PCP - General (Family Medicine) Iva Boop, MD as Consulting Physician (Gastroenterology) Ardeth Sportsman, MD (General Surgery) Martina Sinner, MD as Consulting Physician (Urology)  This patient is a 56 y.o.female who presents today for surgical evaluation.   Procedure: Repeat low rectovaginal fistula repair with bulbocavernosus Martius flap as interposition 09/27/2011  The patient comes in today feeling well. Some defecation of mucus. No vaginal drainage. Minimal pain. Off narcotics.  Finishes PO Augmentin today. She is in good spirits. No problems with her ileostomy.  The drain of the labia is 1/8 -1/4 oze a day. Minimal irritation. Using sitz baths p.r.n. Using toilet paper p.r.n.   Patient Active Problem List  Diagnoses  . Rectovaginal fistula  . History of rectal cancer, T2N0, Nov2010  . Vaginal Discharge    Past Medical History  Diagnosis Date  . Rectovaginal fistula   . Rectal carcinoma     Past Surgical History  Procedure Date  . Colon resection 2010    COLON CANCER  . Ostomy take down 2011  . Ostomy placement 2011    with RV fistula repair  . Tubal ligation   . Rectovaginal fistula closure [id number], [id number]  . Colonoscopy w/ biopsies and polypectomy 04/09/2009    adenocarcinoma  . Eus 05/03/2009    rectal adenocarcinoma  . Recto-vaginal fissure repair 10/23/2011    Procedure: REPAIR RECTO-VAGINAL FISSURE;  Surgeon: Ardeth Sportsman, MD;  Location: [facility];  Service: General;  Laterality: N/A;  Repair of Rectovaginal Fistula with Pedicle Martius Flap with Dr Lorin Picket MacDiarmid to co-surgeon   . Vesico-vaginal fistula repair 10/23/2011    Procedure: FISTULA REPAIR VESICO-VAGINAL;  Surgeon: Martina Sinner, MD;  Location: [facility];  Service: Urology;  Laterality: N/A;   martius flap    History   Social History  . Marital Status: Married    Spouse Name: N/A    Number of Children: N/A  . Years of Education: N/A   Occupational History  . Not on file.   Social History Main Topics  . Smoking status: Former Smoker -- 1.0 packs/day for 35 years  . Smokeless tobacco: Never Used   Comment: 2010 QUIT SMOKING  . Alcohol Use: No  . Drug Use: No  . Sexually Active:    Other Topics Concern  . Not on file   Social History Narrative  . No narrative on file    Family History  Problem Relation Age of Onset  . Hypertension Mother   . Cancer Father     lung  . Diabetes Father   . Cancer Brother     bladder    Current Outpatient Prescriptions  Medication Sig Dispense Refill  . amoxicillin-clavulanate (AUGMENTIN) 875-125 MG per tablet Take 1 tablet by mouth every 12 (twelve) hours.  20 tablet  5  . Calcium-Vitamin D-Vitamin K (CALCIUM SOFT CHEWS PO) Take 600 Units by mouth 2 (two) times daily.       Marland Kitchen HYDROmorphone (DILAUDID) 4 MG tablet Take 0.5-2 tablets (2-8 mg total) by mouth every 4 (four) hours as needed for pain.  50 tablet  0  . Multiple Vitamin (MULTIVITAMIN) capsule Take 1 capsule by mouth every morning.       . Nutritional Supplements (PYCNOGENOL PO) Take 50 mg by mouth every morning.       Marland Kitchen  traMADol (ULTRAM) 50 MG tablet Take 1 tablet (50 mg total) by mouth every 6 (six) hours as needed for pain.  40 tablet  0     Allergies  Allergen Reactions  . Sulfonamide Derivatives     REACTION: itching    BP 112/70  Pulse 104  Temp(Src) 99 F (37.2 C) (Temporal)  Resp 16  Ht 5\' 2"  (1.575 m)  Wt 161 lb 6.4 oz (73.211 kg)  BMI 29.52 kg/m2     Review of Systems  Constitutional: Negative for fever, chills and diaphoresis.  HENT: Negative for ear pain, sore throat and trouble swallowing.   Eyes: Negative for photophobia and visual disturbance.  Respiratory: Negative for cough and choking.   Cardiovascular: Negative for chest pain and  palpitations.  Gastrointestinal: Negative for nausea, vomiting, abdominal pain, diarrhea, constipation, anal bleeding and rectal pain.  Genitourinary: Negative for dysuria, frequency and difficulty urinating.  Musculoskeletal: Negative for myalgias and gait problem.  Skin: Negative for color change, pallor and rash.  Neurological: Negative for dizziness, speech difficulty, weakness and numbness.  Hematological: Negative for adenopathy.  Psychiatric/Behavioral: Negative for confusion and agitation. The patient is not nervous/anxious.        Objective:   Physical Exam  Constitutional: She is oriented to person, place, and time. She appears well-developed and well-nourished. No distress.  HENT:  Head: Normocephalic.  Mouth/Throat: Oropharynx is clear and moist. No oropharyngeal exudate.  Eyes: Conjunctivae and EOM are normal. Pupils are equal, round, and reactive to light. No scleral icterus.  Neck: Normal range of motion. No tracheal deviation present.  Cardiovascular: Normal rate and intact distal pulses.   Pulmonary/Chest: Effort normal. No respiratory distress. She exhibits no tenderness.  Abdominal: Soft. She exhibits no distension. There is no tenderness. Hernia confirmed negative in the right inguinal area and confirmed negative in the left inguinal area.         Incisions clean with normal healing ridges.  No hernias  Genitourinary:     Musculoskeletal: Normal range of motion. She exhibits no tenderness.  Lymphadenopathy:       Right: No inguinal adenopathy present.       Left: No inguinal adenopathy present.  Neurological: She is alert and oriented to person, place, and time. No cranial nerve deficit. She exhibits normal muscle tone. Coordination normal.  Skin: Skin is warm and dry. No rash noted. She is not diaphoretic.  Psychiatric: She has a normal mood and affect. Her behavior is normal.       Assessment:     2 weeks s/p RV fistula repair, recovering well    Plan:      Increase activity as tolerated.  Do not push through pain.  Advanced on diet as tolerated. Bowel regimen to avoid problems.  Stop PO ABX & follow  See Dr. Sherron Monday w Urology later this week.  Ileostomy when the repair heals - wait many more months first  Return to clinic 3 weeks. The patient expressed understanding and appreciation

## 2011-11-24 ENCOUNTER — Encounter (INDEPENDENT_AMBULATORY_CARE_PROVIDER_SITE_OTHER): Payer: Self-pay | Admitting: Surgery

## 2011-11-24 ENCOUNTER — Ambulatory Visit (INDEPENDENT_AMBULATORY_CARE_PROVIDER_SITE_OTHER): Payer: 59 | Admitting: Surgery

## 2011-11-24 VITALS — BP 102/78 | HR 100 | Temp 98.1°F | Resp 18 | Ht 62.0 in | Wt 159.0 lb

## 2011-11-24 DIAGNOSIS — Z85048 Personal history of other malignant neoplasm of rectum, rectosigmoid junction, and anus: Secondary | ICD-10-CM

## 2011-11-24 DIAGNOSIS — N824 Other female intestinal-genital tract fistulae: Secondary | ICD-10-CM

## 2011-11-24 DIAGNOSIS — N823 Fistula of vagina to large intestine: Secondary | ICD-10-CM

## 2011-11-24 MED ORDER — TRAMADOL HCL 50 MG PO TABS: 50.0000 mg | ORAL_TABLET | Freq: Four times a day (QID) | ORAL | Status: DC | PRN

## 2011-11-24 NOTE — Progress Notes (Signed)
Subjective:     Patient ID: Emily Holloway, female   DOB: Oct 19, 1955, 56 y.o.   MRN: [id number]  HPI  Emily Holloway  03/02/1956 [id number]  Patient Care Team: Allean Found, MD as PCP - General (Family Medicine) Iva Boop, MD as Consulting Physician (Gastroenterology) Ardeth Sportsman, MD (General Surgery) Martina Sinner, MD as Consulting Physician (Urology)  This patient is a 56 y.o.female who presents today for surgical evaluation.   Procedure redo rectovaginal fistula repair with Martius flap & vaginoplasty 10/23/2011  The patient comes in today feeling better. Some rectal drainage. However nonoperative vagina. Vaginal drainage mild and physiologic. Occasionally using a cotton ball but no irritation. Pain down. Some rectal cramping at times. Morning out of tramadol. Request removal. Eating well.   Patient Active Problem List  Diagnoses  . Rectovaginal fistula  . History of rectal cancer, T2N0, Nov2010  . Vaginal Discharge    Past Medical History  Diagnosis Date  . Rectovaginal fistula   . Rectal carcinoma     Past Surgical History  Procedure Date  . Colon resection 2010    COLON CANCER  . Ostomy take down 2011  . Ostomy placement 2011    with RV fistula repair  . Tubal ligation   . Rectovaginal fistula closure [id number], [id number]  . Colonoscopy w/ biopsies and polypectomy 04/09/2009    adenocarcinoma  . Eus 05/03/2009    rectal adenocarcinoma  . Recto-vaginal fissure repair 10/23/2011    Procedure: REPAIR RECTO-VAGINAL FISSURE;  Surgeon: Ardeth Sportsman, MD;  Location: [facility];  Service: General;  Laterality: N/A;  Repair of Rectovaginal Fistula with Pedicle Martius Flap with Dr Lorin Picket MacDiarmid to co-surgeon   . Vesico-vaginal fistula repair 10/23/2011    Procedure: FISTULA REPAIR VESICO-VAGINAL;  Surgeon: Martina Sinner, MD;  Location: [facility];  Service: Urology;  Laterality: N/A;  martius flap    History   Social History  . Marital Status:  Married    Spouse Name: N/A    Number of Children: N/A  . Years of Education: N/A   Occupational History  . Not on file.   Social History Main Topics  . Smoking status: Former Smoker -- 1.0 packs/day for 35 years  . Smokeless tobacco: Never Used   Comment: 2010 QUIT SMOKING  . Alcohol Use: No  . Drug Use: No  . Sexually Active: Not on file   Other Topics Concern  . Not on file   Social History Narrative  . No narrative on file    Family History  Problem Relation Age of Onset  . Hypertension Mother   . Cancer Father     lung  . Diabetes Father   . Cancer Brother     bladder    Current Outpatient Prescriptions  Medication Sig Dispense Refill  . HYDROmorphone (DILAUDID) 4 MG tablet as needed.      . Calcium-Vitamin D-Vitamin K (CALCIUM SOFT CHEWS PO) Take 600 Units by mouth 2 (two) times daily.       . Multiple Vitamin (MULTIVITAMIN) capsule Take 1 capsule by mouth every morning.       . Nutritional Supplements (PYCNOGENOL PO) Take 50 mg by mouth every morning.       . traMADol (ULTRAM) 50 MG tablet Take 1 tablet (50 mg total) by mouth every 6 (six) hours as needed for pain.  40 tablet  0     Allergies  Allergen Reactions  . Sulfonamide Derivatives     REACTION: itching  BP 102/78  Pulse 100  Temp(Src) 98.1 F (36.7 C) (Temporal)  Resp 18  Ht 5\' 2"  (1.575 m)  Wt 159 lb (72.122 kg)  BMI 29.08 kg/m2     Review of Systems  Constitutional: Negative for fever, chills and diaphoresis.  HENT: Negative for ear pain, sore throat and trouble swallowing.   Eyes: Negative for photophobia and visual disturbance.  Respiratory: Negative for cough and choking.   Cardiovascular: Negative for chest pain and palpitations.  Gastrointestinal: Negative for nausea, vomiting, abdominal pain, diarrhea, constipation, anal bleeding and rectal pain.  Genitourinary: Negative for dysuria, frequency and difficulty urinating.  Musculoskeletal: Negative for myalgias and gait  problem.  Skin: Negative for color change, pallor and rash.  Neurological: Negative for dizziness, speech difficulty, weakness and numbness.  Hematological: Negative for adenopathy.  Psychiatric/Behavioral: Negative for confusion and agitation. The patient is not nervous/anxious.        Objective:   Physical Exam  Constitutional: She is oriented to person, place, and time. She appears well-developed and well-nourished. No distress.  HENT:  Head: Normocephalic.  Mouth/Throat: Oropharynx is clear and moist. No oropharyngeal exudate.  Eyes: Conjunctivae and EOM are normal. Pupils are equal, round, and reactive to light. No scleral icterus.  Neck: Normal range of motion. No tracheal deviation present.  Cardiovascular: Normal rate and intact distal pulses.   Pulmonary/Chest: Effort normal. No respiratory distress. She exhibits no tenderness.  Abdominal: Soft. She exhibits no distension. There is no tenderness. Hernia confirmed negative in the right inguinal area and confirmed negative in the left inguinal area.       Incisions clean with normal healing ridges.  No hernias  Genitourinary:    No vaginal discharge found.  Musculoskeletal: Normal range of motion. She exhibits no tenderness.  Lymphadenopathy:       Right: No inguinal adenopathy present.       Left: No inguinal adenopathy present.  Neurological: She is alert and oriented to person, place, and time. No cranial nerve deficit. She exhibits normal muscle tone. Coordination normal.  Skin: Skin is warm and dry. No rash noted. She is not diaphoretic.  Psychiatric: She has a normal mood and affect. Her behavior is normal.       Assessment:     S/p redo RV fistula repair, healing OK so far    Plan:     Increase activity as tolerated.  Do not push through pain.  Advanced on diet as tolerated. Bowel regimen to avoid problems.  Continue keeping area clean/dry  Renew tramadol #40  Return to clinic 1 month. The patient  expressed understanding and appreciation

## 2011-12-24 ENCOUNTER — Encounter (INDEPENDENT_AMBULATORY_CARE_PROVIDER_SITE_OTHER): Payer: 59 | Admitting: Surgery

## 2011-12-25 ENCOUNTER — Encounter (INDEPENDENT_AMBULATORY_CARE_PROVIDER_SITE_OTHER): Payer: Self-pay | Admitting: Surgery

## 2011-12-25 ENCOUNTER — Ambulatory Visit (INDEPENDENT_AMBULATORY_CARE_PROVIDER_SITE_OTHER): Payer: 59 | Admitting: Surgery

## 2011-12-25 VITALS — BP 116/70 | HR 100 | Temp 98.4°F | Resp 14 | Ht 62.0 in | Wt 157.2 lb

## 2011-12-25 DIAGNOSIS — Z85048 Personal history of other malignant neoplasm of rectum, rectosigmoid junction, and anus: Secondary | ICD-10-CM

## 2011-12-25 DIAGNOSIS — N823 Fistula of vagina to large intestine: Secondary | ICD-10-CM

## 2011-12-25 DIAGNOSIS — N824 Other female intestinal-genital tract fistulae: Secondary | ICD-10-CM

## 2011-12-25 NOTE — Patient Instructions (Signed)
Call after you visit Urology (Dr. Sherron Monday)

## 2011-12-25 NOTE — Progress Notes (Signed)
Subjective:     Patient ID: Emily Holloway, female   DOB: 1956-05-06, 56 y.o.   MRN: [id number]  HPI   Kahlan Engebretson  09/19/1955 [id number]  Patient Care Team: Allean Found, MD as PCP - General (Family Medicine) Iva Boop, MD as Consulting Physician (Gastroenterology) Ardeth Sportsman, MD (General Surgery) Martina Sinner, MD as Consulting Physician (Urology)  This patient is a 56 y.o.female who presents today for surgical evaluation.   Procedure: Redo rectovaginal fistula repair with Martius flap & vaginoplasty 10/23/2011  The patient comes in today feeling better. Some rectal drainage. Primarily mucus. She is continent and can control evacuation. Still none out her vagina.  Some mild rectal cramping at times. Controlled with rare tramadol..   Patient Active Problem List  Diagnoses  . Rectovaginal fistula  . History of rectal cancer, T2N0, Nov2010    Past Medical History  Diagnosis Date  . Rectovaginal fistula   . Rectal carcinoma     Past Surgical History  Procedure Date  . Colon resection 2010    COLON CANCER  . Ostomy take down 2011  . Ostomy placement 2011    with RV fistula repair  . Tubal ligation   . Rectovaginal fistula closure [id number], [id number]  . Colonoscopy w/ biopsies and polypectomy 04/09/2009    adenocarcinoma  . Eus 05/03/2009    rectal adenocarcinoma  . Recto-vaginal fissure repair 10/23/2011    Procedure: REPAIR RECTO-VAGINAL FISSURE;  Surgeon: Ardeth Sportsman, MD;  Location: [facility];  Service: General;  Laterality: N/A;  Repair of Rectovaginal Fistula with Pedicle Martius Flap with Dr Lorin Picket MacDiarmid to co-surgeon   . Vesico-vaginal fistula repair 10/23/2011    Procedure: FISTULA REPAIR VESICO-VAGINAL;  Surgeon: Martina Sinner, MD;  Location: [facility];  Service: Urology;  Laterality: N/A;  martius flap    History   Social History  . Marital Status: Married    Spouse Name: N/A    Number of Children: N/A  . Years of Education:  N/A   Occupational History  . Not on file.   Social History Main Topics  . Smoking status: Former Smoker -- 1.0 packs/day for 35 years  . Smokeless tobacco: Never Used   Comment: 2010 QUIT SMOKING  . Alcohol Use: No  . Drug Use: No  . Sexually Active: Not on file   Other Topics Concern  . Not on file   Social History Narrative  . No narrative on file    Family History  Problem Relation Age of Onset  . Hypertension Mother   . Cancer Father     lung  . Diabetes Father   . Cancer Brother     bladder    Current Outpatient Prescriptions  Medication Sig Dispense Refill  . Calcium-Vitamin D-Vitamin K (CALCIUM SOFT CHEWS PO) Take 600 Units by mouth 2 (two) times daily.       Marland Kitchen HYDROmorphone (DILAUDID) 4 MG tablet as needed.      . Multiple Vitamin (MULTIVITAMIN) capsule Take 1 capsule by mouth every morning.       . Nutritional Supplements (PYCNOGENOL PO) Take 50 mg by mouth every morning.       . traMADol (ULTRAM) 50 MG tablet Take 1 tablet (50 mg total) by mouth every 6 (six) hours as needed for pain.  40 tablet  0     Allergies  Allergen Reactions  . Sulfonamide Derivatives     REACTION: itching    BP 116/70  Pulse 100  Temp(Src) 98.4 F (36.9 C) (Temporal)  Resp 14  Ht 5\' 2"  (1.575 m)  Wt 157 lb 3.2 oz (71.305 kg)  BMI 28.75 kg/m2     Review of Systems  Constitutional: Negative for fever, chills and diaphoresis.  HENT: Negative for ear pain, sore throat and trouble swallowing.   Eyes: Negative for photophobia and visual disturbance.  Respiratory: Negative for cough and choking.   Cardiovascular: Negative for chest pain and palpitations.  Gastrointestinal: Negative for nausea, vomiting, abdominal pain, diarrhea, constipation, anal bleeding and rectal pain.  Genitourinary: Negative for dysuria, frequency and difficulty urinating.  Musculoskeletal: Negative for myalgias and gait problem.  Skin: Negative for color change, pallor and rash.  Neurological:  Negative for dizziness, speech difficulty, weakness and numbness.  Hematological: Negative for adenopathy.  Psychiatric/Behavioral: Negative for confusion and agitation. The patient is not nervous/anxious.        Objective:   Physical Exam  Constitutional: She is oriented to person, place, and time. She appears well-developed and well-nourished. No distress.  HENT:  Head: Normocephalic.  Mouth/Throat: Oropharynx is clear and moist. No oropharyngeal exudate.  Eyes: Conjunctivae and EOM are normal. Pupils are equal, round, and reactive to light. No scleral icterus.  Neck: Normal range of motion. No tracheal deviation present.  Cardiovascular: Normal rate and intact distal pulses.   Pulmonary/Chest: Effort normal. No respiratory distress. She exhibits no tenderness.  Abdominal: Soft. She exhibits no distension. There is no tenderness. Hernia confirmed negative in the right inguinal area and confirmed negative in the left inguinal area.       Incisions clean with normal healing ridges.  No hernias  Genitourinary:    No vaginal discharge found.  Musculoskeletal: Normal range of motion. She exhibits no tenderness.  Lymphadenopathy:       Right: No inguinal adenopathy present.       Left: No inguinal adenopathy present.  Neurological: She is alert and oriented to person, place, and time. No cranial nerve deficit. She exhibits normal muscle tone. Coordination normal.  Skin: Skin is warm and dry. No rash noted. She is not diaphoretic.  Psychiatric: She has a normal mood and affect. Her behavior is normal.       Assessment:     S/p redo RV fistula repair, healing OK so far    Plan:     Increase activity as tolerated.  Do not push through pain.  Advanced on diet as tolerated. Bowel regimen to avoid problems.  Continue keeping area clean/dry  This is the longest time. Of avoiding recurrence. I'm hopeful that this repair is working. So she.  She is due to see urology later in the  month. If Dr. McDiarmid feels the area is healed well, set up for antegrade enema through the loop ileostomy to confirm no severe stricturing or blockage. Then consider ileostomy takedown. I would like to wait until all granulation tissue was resolved and all incisions are firmly closed on the vagina. Hopefully that will be within the next month. She is hopeful as well.  Return to clinic 1 month. The patient expressed understanding and appreciation

## 2012-01-06 ENCOUNTER — Other Ambulatory Visit: Payer: Self-pay | Admitting: Family Medicine

## 2012-01-06 DIAGNOSIS — Z1231 Encounter for screening mammogram for malignant neoplasm of breast: Secondary | ICD-10-CM

## 2012-01-12 ENCOUNTER — Ambulatory Visit
Admission: RE | Admit: 2012-01-12 | Discharge: 2012-01-12 | Disposition: A | Discharge status: Home / Self Care | Payer: 59 | Source: Ambulatory Visit | Attending: Family Medicine | Admitting: Family Medicine

## 2012-01-12 DIAGNOSIS — Z1231 Encounter for screening mammogram for malignant neoplasm of breast: Secondary | ICD-10-CM

## 2012-01-16 ENCOUNTER — Telehealth (INDEPENDENT_AMBULATORY_CARE_PROVIDER_SITE_OTHER): Payer: Self-pay | Admitting: General Surgery

## 2012-01-16 NOTE — Telephone Encounter (Signed)
Set up the enema   Schedule for ileostomy takedown

## 2012-01-16 NOTE — Telephone Encounter (Signed)
Message copied by Liliana Cline on Fri Jan 16, 2012 11:41 AM ------      Message from: Zacarias Pontes      Created: Fri Jan 16, 2012  9:15 AM       Pt has had visit with Dr Sherron Monday and was told to call and see what next step will now be..cell [id number]

## 2012-01-16 NOTE — Telephone Encounter (Signed)
Spoke with patient. From her reports Dr McDiarmid states everything is well healed and she should be ready for the next step. Per her last office note that looks like an antegrade enema through the loop ileostomy. I informed patient I would verify this with Dr Michaell Cowing, then we would order test and call her with appt date/time. She does state she could tell a big difference in the soreness level from her last exam and it is much better from three weeks ago. She will call with any questions in the interim.

## 2012-01-19 ENCOUNTER — Telehealth (INDEPENDENT_AMBULATORY_CARE_PROVIDER_SITE_OTHER): Payer: Self-pay

## 2012-01-19 DIAGNOSIS — N823 Fistula of vagina to large intestine: Secondary | ICD-10-CM

## 2012-01-19 NOTE — Telephone Encounter (Signed)
LMOM for pt to call me b/c I have her scheduled for an Enema at [facility] on 6/28 arrive at 10:15 nothing to eat or drink after midnight.

## 2012-01-23 ENCOUNTER — Telehealth (INDEPENDENT_AMBULATORY_CARE_PROVIDER_SITE_OTHER): Payer: Self-pay

## 2012-01-23 ENCOUNTER — Ambulatory Visit (HOSPITAL_COMMUNITY)
Admission: RE | Admit: 2012-01-23 | Discharge: 2012-01-23 | Disposition: A | Discharge status: Home / Self Care | Payer: 59 | Source: Ambulatory Visit | Attending: Surgery | Admitting: Surgery

## 2012-01-23 DIAGNOSIS — Z9049 Acquired absence of other specified parts of digestive tract: Secondary | ICD-10-CM | POA: Insufficient documentation

## 2012-01-23 DIAGNOSIS — N823 Fistula of vagina to large intestine: Secondary | ICD-10-CM

## 2012-01-23 NOTE — Telephone Encounter (Signed)
Called pt to notify her that her test results show no leak and pt is ready for surgery per Dr Michaell Cowing. The pt understands and will wait to hear from our office.

## 2012-02-03 ENCOUNTER — Other Ambulatory Visit (INDEPENDENT_AMBULATORY_CARE_PROVIDER_SITE_OTHER): Payer: Self-pay | Admitting: Surgery

## 2012-02-09 ENCOUNTER — Other Ambulatory Visit (INDEPENDENT_AMBULATORY_CARE_PROVIDER_SITE_OTHER): Payer: Self-pay | Admitting: Surgery

## 2012-02-11 ENCOUNTER — Other Ambulatory Visit (INDEPENDENT_AMBULATORY_CARE_PROVIDER_SITE_OTHER): Payer: Self-pay | Admitting: Surgery

## 2012-02-12 NOTE — Telephone Encounter (Signed)
Please advise if okay to refill tramadol.

## 2012-02-18 ENCOUNTER — Encounter (HOSPITAL_COMMUNITY): Payer: Self-pay | Admitting: Pharmacy Technician

## 2012-02-26 ENCOUNTER — Encounter (HOSPITAL_COMMUNITY): Payer: Self-pay

## 2012-02-26 ENCOUNTER — Encounter (HOSPITAL_COMMUNITY)
Admission: RE | Admit: 2012-02-26 | Discharge: 2012-02-26 | Disposition: A | Discharge status: Home / Self Care | Payer: 59 | Source: Ambulatory Visit | Attending: Surgery | Admitting: Surgery

## 2012-02-26 LAB — BASIC METABOLIC PANEL
BUN: 13 mg/dL (ref 6–23)
CO2: 31 mEq/L (ref 19–32)
Calcium: 9.4 mg/dL (ref 8.4–10.5)
GFR calc non Af Amer: >90 mL/min (ref >90)
Glucose, Bld: 91 mg/dL (ref 70–99)

## 2012-02-26 LAB — CBC
HCT: 36.5 % (ref 36.0–46.0)
Hemoglobin: 11.8 g/dL — ABNORMAL LOW (ref 12.0–15.0)
MCH: 29.3 pg (ref 26.0–34.0)
MCHC: 32.3 g/dL (ref 30.0–36.0)
MCV: 90.6 fL (ref 78.0–100.0)

## 2012-02-26 MED ORDER — CHLORHEXIDINE GLUCONATE 4 % EX LIQD: 1.0000 "application " | Freq: Once | CUTANEOUS | Status: DC

## 2012-02-26 NOTE — Pre-Procedure Instructions (Signed)
CBC, BMET WERE DONE TODAY AT [facility] PREOP.  T/S WILL BE DRAWN THE AM OF SURGERY.  PT DOES NOT NEED EKG OR CXR PREOP-PER ANESTHESIOLOGIST'S GUIDELINES. PREOP INSTRUCTIONS WERE DISCUSSED WITH PT USING THE TEACH BACK METHOD.

## 2012-02-26 NOTE — Patient Instructions (Signed)
YOUR SURGERY IS SCHEDULED ON:  Friday  8/9  AT 7:30 AM  REPORT TO [facility] AT:  5:30 AM      PHONE # FOR SHORT STAY IS [phone]  DO NOT EAT OR DRINK ANYTHING AFTER MIDNIGHT THE NIGHT BEFORE YOUR SURGERY.  YOU MAY BRUSH YOUR TEETH, RINSE OUT YOUR MOUTH--BUT NO WATER, NO FOOD, NO CHEWING GUM, NO MINTS, NO CANDIES, NO CHEWING TOBACCO.  PLEASE TAKE THE FOLLOWING MEDICATIONS THE AM OF YOUR SURGERY WITH A FEW SIPS OF WATER:  NO MEDICINES TO TAKE    IF YOU USE INHALERS--USE YOUR INHALERS THE AM OF YOUR SURGERY AND BRING INHALERS TO THE HOSPITAL -TAKE TO SURGERY.    IF YOU ARE DIABETIC:  DO NOT TAKE ANY DIABETIC MEDICATIONS THE AM OF YOUR SURGERY.  IF YOU TAKE INSULIN IN THE EVENINGS--PLEASE ONLY TAKE 1/2 NORMAL EVENING DOSE THE NIGHT BEFORE YOUR SURGERY.  NO INSULIN THE AM OF YOUR SURGERY.  IF YOU HAVE SLEEP APNEA AND USE CPAP OR BIPAP--PLEASE BRING THE MASK --NOT THE MACHINE-NOT THE TUBING   -JUST THE MASK. DO NOT BRING VALUABLES, MONEY, CREDIT CARDS.  CONTACT LENS, DENTURES / PARTIALS, GLASSES SHOULD NOT BE WORN TO SURGERY AND IN MOST CASES-HEARING AIDS WILL NEED TO BE REMOVED.  BRING YOUR GLASSES CASE, ANY EQUIPMENT NEEDED FOR YOUR CONTACT LENS. FOR PATIENTS ADMITTED TO THE HOSPITAL--CHECK OUT TIME THE DAY OF DISCHARGE IS 11:00 AM.  ALL INPATIENT ROOMS ARE PRIVATE - WITH BATHROOM, TELEPHONE, TELEVISION AND WIFI INTERNET. IF YOU ARE BEING DISCHARGED THE SAME DAY OF YOUR SURGERY--YOU CAN NOT DRIVE YOURSELF HOME--AND SHOULD NOT GO HOME ALONE BY TAXI OR BUS.  NO DRIVING OR OPERATING MACHINERY FOR 24 HOURS FOLLOWING ANESTHESIA / PAIN MEDICATIONS.                            SPECIAL INSTRUCTIONS:  CHLORHEXIDINE SOAP SHOWER (other brand names are Betasept and Hibiclens ) PLEASE SHOWER WITH CHLORHEXIDINE THE NIGHT BEFORE YOUR SURGERY AND THE AM OF YOUR SURGERY. DO NOT USE CHLORHEXIDINE ON YOUR FACE OR PRIVATE AREAS--YOU MAY USE YOUR NORMAL SOAP THOSE AREAS AND YOUR NORMAL SHAMPOO.    WOMEN SHOULD AVOID SHAVING UNDER ARMS AND SHAVING LEGS 48 HOURS BEFORE USING CHLORHEXIDINE TO AVOID SKIN IRRITATION.  DO NOT USE IF ALLERGIC TO CHLORHEXIDINE.  PLEASE READ OVER ANY  FACT SHEETS THAT YOU WERE GIVEN: MRSA INFORMATION, BLOOD TRANSFUSION INFORMATION

## 2012-03-05 ENCOUNTER — Encounter (HOSPITAL_COMMUNITY): Payer: Self-pay | Admitting: *Deleted

## 2012-03-05 ENCOUNTER — Ambulatory Visit (HOSPITAL_COMMUNITY): Payer: 59 | Admitting: *Deleted

## 2012-03-05 ENCOUNTER — Inpatient Hospital Stay (HOSPITAL_COMMUNITY)
Admission: RE | Admit: 2012-03-05 | Discharge: 2012-03-10 | DRG: 345 | Disposition: A | Discharge status: Home / Self Care | Payer: 59 | Source: Ambulatory Visit | Attending: Surgery | Admitting: Surgery

## 2012-03-05 ENCOUNTER — Encounter (HOSPITAL_COMMUNITY)
Admission: RE | Disposition: A | Discharge status: Home / Self Care | Payer: Self-pay | Source: Ambulatory Visit | Attending: Surgery

## 2012-03-05 DIAGNOSIS — Z432 Encounter for attention to ileostomy: Secondary | ICD-10-CM

## 2012-03-05 DIAGNOSIS — Z932 Ileostomy status: Secondary | ICD-10-CM

## 2012-03-05 DIAGNOSIS — N823 Fistula of vagina to large intestine: Secondary | ICD-10-CM | POA: Diagnosis present

## 2012-03-05 DIAGNOSIS — K435 Parastomal hernia without obstruction or  gangrene: Secondary | ICD-10-CM | POA: Diagnosis present

## 2012-03-05 DIAGNOSIS — Z9049 Acquired absence of other specified parts of digestive tract: Secondary | ICD-10-CM

## 2012-03-05 DIAGNOSIS — IMO0002 Reserved for concepts with insufficient information to code with codable children: Secondary | ICD-10-CM | POA: Diagnosis present

## 2012-03-05 DIAGNOSIS — Z85048 Personal history of other malignant neoplasm of rectum, rectosigmoid junction, and anus: Secondary | ICD-10-CM

## 2012-03-05 HISTORY — PX: ILEOSTOMY CLOSURE: SHX1784

## 2012-03-05 HISTORY — PX: PARASTOMAL HERNIA REPAIR: SHX2162

## 2012-03-05 LAB — CBC
HCT: 37.5 % (ref 36.0–46.0)
MCH: 28.7 pg (ref 26.0–34.0)
MCV: 91.9 fL (ref 78.0–100.0)
RBC: 4.08 MIL/uL (ref 3.87–5.11)
RDW: 15.7 % — ABNORMAL HIGH (ref 11.5–15.5)
WBC: 14.4 10*3/uL — ABNORMAL HIGH (ref 4.0–10.5)

## 2012-03-05 LAB — CREATININE, SERUM: GFR calc Af Amer: >90 mL/min (ref >90)

## 2012-03-05 SURGERY — CLOSURE, ILEOSTOMY
Anesthesia: General | Site: Abdomen | Wound class: Contaminated

## 2012-03-05 MED ORDER — NAPROXEN 500 MG PO TABS
500.0000 mg | ORAL_TABLET | Freq: Two times a day (BID) | ORAL | Status: DC
  Administered 2012-03-05 – 2012-03-10 (×10): 500 mg via ORAL
  Filled 2012-03-05 (×12): qty 1

## 2012-03-05 MED ORDER — NEOSTIGMINE METHYLSULFATE 1 MG/ML IJ SOLN
INTRAMUSCULAR | Status: DC | PRN
  Administered 2012-03-05: 1 mg via INTRAVENOUS

## 2012-03-05 MED ORDER — BUPIVACAINE 0.25 % ON-Q PUMP DUAL CATH 300 ML
300.0000 mL | INJECTION | Status: DC
  Filled 2012-03-05: qty 300

## 2012-03-05 MED ORDER — PSYLLIUM 95 % PO PACK
1.0000 | PACK | Freq: Two times a day (BID) | ORAL | Status: DC
  Administered 2012-03-05 – 2012-03-08 (×3): 1 via ORAL
  Filled 2012-03-05 (×9): qty 1

## 2012-03-05 MED ORDER — HYDROMORPHONE HCL PF 1 MG/ML IJ SOLN: 0.5000 mg | INTRAMUSCULAR | Status: DC | PRN

## 2012-03-05 MED ORDER — 0.9 % SODIUM CHLORIDE (POUR BTL) OPTIME
TOPICAL | Status: DC | PRN
  Administered 2012-03-05: 1000 mL

## 2012-03-05 MED ORDER — ACETAMINOPHEN 10 MG/ML IV SOLN
INTRAVENOUS | Status: AC
  Filled 2012-03-05: qty 100

## 2012-03-05 MED ORDER — LIP MEDEX EX OINT
1.0000 "application " | TOPICAL_OINTMENT | Freq: Two times a day (BID) | CUTANEOUS | Status: DC
  Administered 2012-03-05 – 2012-03-09 (×9): 1 via TOPICAL
  Filled 2012-03-05: qty 7

## 2012-03-05 MED ORDER — BUPIVACAINE-EPINEPHRINE PF 0.25-1:200000 % IJ SOLN
INTRAMUSCULAR | Status: AC
  Filled 2012-03-05: qty 30

## 2012-03-05 MED ORDER — BUPIVACAINE 0.25 % ON-Q PUMP DUAL CATH 300 ML
INJECTION | Status: DC | PRN
  Administered 2012-03-05: 300 mL

## 2012-03-05 MED ORDER — SODIUM CHLORIDE 0.9 % IJ SOLN: 3.0000 mL | INTRAMUSCULAR | Status: DC | PRN

## 2012-03-05 MED ORDER — ACETAMINOPHEN 325 MG PO TABS: 325.0000 mg | ORAL_TABLET | Freq: Four times a day (QID) | ORAL | Status: DC | PRN

## 2012-03-05 MED ORDER — SODIUM CHLORIDE 0.9 % IJ SOLN
3.0000 mL | Freq: Two times a day (BID) | INTRAMUSCULAR | Status: DC
  Administered 2012-03-05: 3 mL via INTRAVENOUS

## 2012-03-05 MED ORDER — FENTANYL CITRATE 0.05 MG/ML IJ SOLN
INTRAMUSCULAR | Status: DC | PRN
  Administered 2012-03-05: 50 ug via INTRAVENOUS
  Administered 2012-03-05: 100 ug via INTRAVENOUS
  Administered 2012-03-05: 50 ug via INTRAVENOUS
  Administered 2012-03-05: 100 ug via INTRAVENOUS

## 2012-03-05 MED ORDER — METRONIDAZOLE IN NACL 5-0.79 MG/ML-% IV SOLN
500.0000 mg | Freq: Four times a day (QID) | INTRAVENOUS | Status: AC
Start: 2012-03-05 — End: 2012-03-06
  Administered 2012-03-05 – 2012-03-06 (×3): 500 mg via INTRAVENOUS
  Filled 2012-03-05 (×3): qty 100

## 2012-03-05 MED ORDER — HEPARIN SODIUM (PORCINE) 5000 UNIT/ML IJ SOLN
5000.0000 [IU] | Freq: Once | INTRAMUSCULAR | Status: AC
  Administered 2012-03-05: 5000 [IU] via SUBCUTANEOUS
  Filled 2012-03-05: qty 1

## 2012-03-05 MED ORDER — DIPHENHYDRAMINE HCL 50 MG/ML IJ SOLN: 12.5000 mg | Freq: Four times a day (QID) | INTRAMUSCULAR | Status: DC | PRN

## 2012-03-05 MED ORDER — HYDROCORTISONE ACE-PRAMOXINE 2.5-1 % RE CREA
1.0000 "application " | TOPICAL_CREAM | Freq: Four times a day (QID) | RECTAL | Status: DC | PRN
  Filled 2012-03-05: qty 30

## 2012-03-05 MED ORDER — LACTATED RINGERS IV SOLN: INTRAVENOUS | Status: DC

## 2012-03-05 MED ORDER — PROMETHAZINE HCL 25 MG/ML IJ SOLN
12.5000 mg | Freq: Four times a day (QID) | INTRAMUSCULAR | Status: DC | PRN
  Administered 2012-03-06 – 2012-03-07 (×2): 12.5 mg via INTRAVENOUS
  Filled 2012-03-05 (×2): qty 1

## 2012-03-05 MED ORDER — PROPOFOL 10 MG/ML IV EMUL
INTRAVENOUS | Status: DC | PRN
  Administered 2012-03-05: 150 mg via INTRAVENOUS

## 2012-03-05 MED ORDER — METRONIDAZOLE IN NACL 5-0.79 MG/ML-% IV SOLN
INTRAVENOUS | Status: AC
  Filled 2012-03-05: qty 100

## 2012-03-05 MED ORDER — SODIUM CHLORIDE 0.9 % IV SOLN: 250.0000 mL | INTRAVENOUS | Status: DC | PRN

## 2012-03-05 MED ORDER — LORAZEPAM BOLUS VIA INFUSION: 0.5000 mg | Freq: Three times a day (TID) | INTRAVENOUS | Status: DC | PRN

## 2012-03-05 MED ORDER — LACTATED RINGERS IV SOLN
INTRAVENOUS | Status: DC
  Administered 2012-03-05: 14:00:00 via INTRAVENOUS
  Administered 2012-03-07: 75 mL/h via INTRAVENOUS
  Administered 2012-03-08 – 2012-03-09 (×3): via INTRAVENOUS

## 2012-03-05 MED ORDER — ONDANSETRON HCL 4 MG/2ML IJ SOLN
INTRAMUSCULAR | Status: DC | PRN
  Administered 2012-03-05 (×2): 2 mg via INTRAVENOUS

## 2012-03-05 MED ORDER — ALVIMOPAN 12 MG PO CAPS
12.0000 mg | ORAL_CAPSULE | Freq: Once | ORAL | Status: AC
  Administered 2012-03-05: 12 mg via ORAL
  Filled 2012-03-05: qty 1

## 2012-03-05 MED ORDER — ALUM & MAG HYDROXIDE-SIMETH 200-200-20 MG/5ML PO SUSP
30.0000 mL | Freq: Four times a day (QID) | ORAL | Status: DC | PRN
Start: 2012-03-05 — End: 2012-03-10
  Administered 2012-03-05 – 2012-03-08 (×4): 30 mL via ORAL
  Filled 2012-03-05 (×4): qty 30

## 2012-03-05 MED ORDER — BUPIVACAINE ON-Q PAIN PUMP (FOR ORDER SET NO CHG)
INJECTION | Status: DC
  Filled 2012-03-05: qty 1

## 2012-03-05 MED ORDER — MAGIC MOUTHWASH
15.0000 mL | Freq: Four times a day (QID) | ORAL | Status: DC | PRN
  Filled 2012-03-05: qty 15

## 2012-03-05 MED ORDER — LACTATED RINGERS IV BOLUS (SEPSIS): 1000.0000 mL | Freq: Three times a day (TID) | INTRAVENOUS | Status: AC | PRN

## 2012-03-05 MED ORDER — LIDOCAINE HCL (CARDIAC) 20 MG/ML IV SOLN
INTRAVENOUS | Status: DC | PRN
  Administered 2012-03-05: 50 mg via INTRAVENOUS

## 2012-03-05 MED ORDER — CISATRACURIUM BESYLATE (PF) 10 MG/5ML IV SOLN
INTRAVENOUS | Status: DC | PRN
  Administered 2012-03-05: 12 mg via INTRAVENOUS

## 2012-03-05 MED ORDER — ACETAMINOPHEN 10 MG/ML IV SOLN
INTRAVENOUS | Status: DC | PRN
  Administered 2012-03-05: 1000 mg via INTRAVENOUS

## 2012-03-05 MED ORDER — MIDAZOLAM HCL 5 MG/5ML IJ SOLN
INTRAMUSCULAR | Status: DC | PRN
  Administered 2012-03-05 (×2): 1 mg via INTRAVENOUS

## 2012-03-05 MED ORDER — BUPIVACAINE-EPINEPHRINE 0.25% -1:200000 IJ SOLN
INTRAMUSCULAR | Status: DC | PRN
  Administered 2012-03-05: 60 mL

## 2012-03-05 MED ORDER — METRONIDAZOLE IN NACL 5-0.79 MG/ML-% IV SOLN
500.0000 mg | INTRAVENOUS | Status: AC
  Administered 2012-03-05: 500 mg via INTRAVENOUS

## 2012-03-05 MED ORDER — GLYCOPYRROLATE 0.2 MG/ML IJ SOLN
INTRAMUSCULAR | Status: DC | PRN
  Administered 2012-03-05: 0.2 mg via INTRAVENOUS

## 2012-03-05 MED ORDER — KETOROLAC TROMETHAMINE 15 MG/ML IJ SOLN
15.0000 mg | Freq: Four times a day (QID) | INTRAMUSCULAR | Status: DC
  Administered 2012-03-05 – 2012-03-06 (×5): 15 mg via INTRAVENOUS
  Filled 2012-03-05 (×8): qty 1

## 2012-03-05 MED ORDER — ACETAMINOPHEN 650 MG RE SUPP: 650.0000 mg | Freq: Four times a day (QID) | RECTAL | Status: DC | PRN

## 2012-03-05 MED ORDER — DEXTROSE 5 % IV SOLN
2.0000 g | INTRAVENOUS | Status: AC
  Administered 2012-03-05: 2 g via INTRAVENOUS
  Filled 2012-03-05: qty 2

## 2012-03-05 MED ORDER — HYDROMORPHONE HCL PF 1 MG/ML IJ SOLN
0.2500 mg | INTRAMUSCULAR | Status: DC | PRN
  Administered 2012-03-05 (×3): 0.25 mg via INTRAVENOUS

## 2012-03-05 MED ORDER — SACCHAROMYCES BOULARDII 250 MG PO CAPS
250.0000 mg | ORAL_CAPSULE | Freq: Two times a day (BID) | ORAL | Status: DC
  Administered 2012-03-05 – 2012-03-09 (×9): 250 mg via ORAL
  Filled 2012-03-05 (×12): qty 1

## 2012-03-05 MED ORDER — LACTATED RINGERS IV SOLN
INTRAVENOUS | Status: DC | PRN
  Administered 2012-03-05 (×3): via INTRAVENOUS

## 2012-03-05 MED ORDER — FENTANYL CITRATE 0.05 MG/ML IJ SOLN: 25.0000 ug | INTRAMUSCULAR | Status: DC | PRN

## 2012-03-05 MED ORDER — ALVIMOPAN 12 MG PO CAPS
12.0000 mg | ORAL_CAPSULE | Freq: Two times a day (BID) | ORAL | Status: DC
  Administered 2012-03-06 (×2): 12 mg via ORAL
  Filled 2012-03-05 (×5): qty 1

## 2012-03-05 MED ORDER — LORAZEPAM 2 MG/ML IJ SOLN: 0.5000 mg | Freq: Three times a day (TID) | INTRAMUSCULAR | Status: DC | PRN

## 2012-03-05 MED ORDER — TRAMADOL HCL 50 MG PO TABS: 50.0000 mg | ORAL_TABLET | Freq: Four times a day (QID) | ORAL | Status: DC | PRN

## 2012-03-05 MED ORDER — PHENOL 1.4 % MT LIQD
2.0000 | OROMUCOSAL | Status: DC | PRN
  Filled 2012-03-05: qty 177

## 2012-03-05 MED ORDER — WITCH HAZEL-GLYCERIN EX PADS
1.0000 "application " | MEDICATED_PAD | CUTANEOUS | Status: DC | PRN
  Filled 2012-03-05: qty 100

## 2012-03-05 MED ORDER — DEXAMETHASONE SODIUM PHOSPHATE 10 MG/ML IJ SOLN
INTRAMUSCULAR | Status: DC | PRN
  Administered 2012-03-05: 10 mg via INTRAVENOUS

## 2012-03-05 MED ORDER — TRAMADOL HCL 50 MG PO TABS
50.0000 mg | ORAL_TABLET | Freq: Four times a day (QID) | ORAL | Status: DC | PRN
  Administered 2012-03-05 – 2012-03-09 (×6): 50 mg via ORAL
  Filled 2012-03-05 (×6): qty 1

## 2012-03-05 MED ORDER — PROMETHAZINE HCL 25 MG/ML IJ SOLN: 6.2500 mg | INTRAMUSCULAR | Status: DC | PRN

## 2012-03-05 MED ORDER — HEPARIN SODIUM (PORCINE) 5000 UNIT/ML IJ SOLN
5000.0000 [IU] | Freq: Three times a day (TID) | INTRAMUSCULAR | Status: DC
  Administered 2012-03-06 – 2012-03-10 (×13): 5000 [IU] via SUBCUTANEOUS
  Filled 2012-03-05 (×17): qty 1

## 2012-03-05 MED ORDER — HYDROMORPHONE HCL PF 1 MG/ML IJ SOLN
INTRAMUSCULAR | Status: AC
  Filled 2012-03-05: qty 1

## 2012-03-05 SURGICAL SUPPLY — 65 items
APPLICATOR COTTON TIP 6IN STRL (MISCELLANEOUS) ×3 IMPLANT
BLADE EXTENDED COATED 6.5IN (ELECTRODE) ×1 IMPLANT
BLADE HEX COATED 2.75 (ELECTRODE) ×2 IMPLANT
BLADE SURG SZ10 CARB STEEL (BLADE) ×2 IMPLANT
BLADE SURG SZ11 CARB STEEL (BLADE) ×1 IMPLANT
CANISTER SUCTION 2500CC (MISCELLANEOUS) ×2 IMPLANT
CATH KIT ON Q 7.5IN SLV (PAIN MANAGEMENT) ×2 IMPLANT
CLIP TI LARGE 6 (CLIP) IMPLANT
CLOTH BEACON ORANGE TIMEOUT ST (SAFETY) ×2 IMPLANT
CLSR STERI-STRIP ANTIMIC 1/2X4 (GAUZE/BANDAGES/DRESSINGS) ×1 IMPLANT
COVER MAYO STAND STRL (DRAPES) ×2 IMPLANT
DRAIN CHANNEL 15F RND FF 3/16 (WOUND CARE) ×1 IMPLANT
DRAPE LAPAROSCOPIC ABDOMINAL (DRAPES) ×2 IMPLANT
DRAPE LG THREE QUARTER DISP (DRAPES) IMPLANT
DRAPE WARM FLUID 44X44 (DRAPE) ×2 IMPLANT
DRSG PAD ABDOMINAL 8X10 ST (GAUZE/BANDAGES/DRESSINGS) ×2 IMPLANT
DRSG TEGADERM 4X4.75 (GAUZE/BANDAGES/DRESSINGS) ×5 IMPLANT
ELECT REM PT RETURN 9FT ADLT (ELECTROSURGICAL) ×2
ELECTRODE REM PT RTRN 9FT ADLT (ELECTROSURGICAL) ×1 IMPLANT
EVACUATOR SILICONE 100CC (DRAIN) ×2 IMPLANT
GAUZE SPONGE 2X2 8PLY STRL LF (GAUZE/BANDAGES/DRESSINGS) IMPLANT
GLOVE BIOGEL PI IND STRL 7.0 (GLOVE) ×1 IMPLANT
GLOVE BIOGEL PI INDICATOR 7.0 (GLOVE) ×1
GLOVE ECLIPSE 8.0 STRL XLNG CF (GLOVE) ×2 IMPLANT
GLOVE INDICATOR 8.0 STRL GRN (GLOVE) ×2 IMPLANT
GOWN STRL NON-REIN LRG LVL3 (GOWN DISPOSABLE) ×2 IMPLANT
GOWN STRL REIN XL XLG (GOWN DISPOSABLE) ×4 IMPLANT
HAND ACTIVATED (MISCELLANEOUS) IMPLANT
KIT BASIN OR (CUSTOM PROCEDURE TRAY) ×2 IMPLANT
LEGGING LITHOTOMY PAIR STRL (DRAPES) ×2 IMPLANT
NEEDLE HYPO 22GX1.5 SAFETY (NEEDLE) ×1 IMPLANT
NS IRRIG 1000ML POUR BTL (IV SOLUTION) ×4 IMPLANT
PACK GENERAL/GYN (CUSTOM PROCEDURE TRAY) ×2 IMPLANT
SPONGE GAUZE 2X2 STER 10/PKG (GAUZE/BANDAGES/DRESSINGS) ×1
SPONGE GAUZE 4X4 12PLY (GAUZE/BANDAGES/DRESSINGS) ×2 IMPLANT
STAPLER GUN LINEAR PROX 60 (STAPLE) ×1 IMPLANT
STAPLER PROXIMATE 75MM BLUE (STAPLE) ×1 IMPLANT
STAPLER VISISTAT 35W (STAPLE) ×2 IMPLANT
SUCTION POOLE TIP (SUCTIONS) ×2 IMPLANT
SUT CHROMIC 2 0 SH (SUTURE) IMPLANT
SUT ETHILON 2 0 PS N (SUTURE) ×1 IMPLANT
SUT MNCRL AB 4-0 PS2 18 (SUTURE) ×2 IMPLANT
SUT NOV 1 T60/GS (SUTURE) IMPLANT
SUT NOVA 1 T20/GS 25DT (SUTURE) IMPLANT
SUT NOVA NAB DX-16 0-1 5-0 T12 (SUTURE) IMPLANT
SUT NOVA T20/GS 25 (SUTURE) IMPLANT
SUT PDS AB 1 CT1 27 (SUTURE) ×7 IMPLANT
SUT PDS AB 1 CTX 36 (SUTURE) ×4 IMPLANT
SUT PDS AB 1 TP1 96 (SUTURE) ×2 IMPLANT
SUT PROLENE 2 0 KS (SUTURE) IMPLANT
SUT SILK 2 0 (SUTURE) ×2
SUT SILK 2 0 SH CR/8 (SUTURE) ×2 IMPLANT
SUT SILK 2 0SH CR/8 30 (SUTURE) IMPLANT
SUT SILK 2-0 18XBRD TIE 12 (SUTURE) ×1 IMPLANT
SUT SILK 2-0 30XBRD TIE 12 (SUTURE) IMPLANT
SUT SILK 3 0 (SUTURE) ×2
SUT SILK 3 0 SH CR/8 (SUTURE) ×2 IMPLANT
SUT SILK 3-0 18XBRD TIE 12 (SUTURE) ×1 IMPLANT
SUT VIC AB 3-0 SH 18 (SUTURE) ×3 IMPLANT
SUT VIC AB 3-0 SH 8-18 (SUTURE) ×1 IMPLANT
SYR 20CC LL (SYRINGE) ×1 IMPLANT
TOWEL OR 17X26 10 PK STRL BLUE (TOWEL DISPOSABLE) ×4 IMPLANT
TRAY FOLEY CATH 14FRSI W/METER (CATHETERS) ×2 IMPLANT
WATER STERILE IRR 1500ML POUR (IV SOLUTION) IMPLANT
YANKAUER SUCT BULB TIP NO VENT (SUCTIONS) ×2 IMPLANT

## 2012-03-05 NOTE — Op Note (Addendum)
03/05/2012  10:19 AM  PATIENT:  Emily Holloway  56 y.o. female  Patient Care Team: Allean Found, MD as PCP - General (Family Medicine) Iva Boop, MD as Consulting Physician (Gastroenterology) Ardeth Sportsman, MD (General Surgery) Martina Sinner, MD as Consulting Physician (Urology)  PRE-OPERATIVE DIAGNOSIS:  diverting ileostomy s/p rectovaginal fistula repair  POST-OPERATIVE DIAGNOSIS:  diverting ileostomy s/p rectovaginal fistula repair  PROCEDURE:  Procedure(s): ILEOSTOMY TAKEDOWN HERNIA REPAIR PARASTOMAL Examination of the perineum under anesthesia  SURGEON:  Karie Soda, MD, FACS  ASSISTANT: Ollen Telford Archambeau "PJ" Carolynne Edouard, MD   ANESTHESIA:   local and general  EBL:  Total I/O In: 2500 [I.V.:2500] Out: 425 [Urine:300; Other:100; Blood:25]  Delay start of Pharmacological VTE agent (>24hrs) due to surgical blood loss or risk of bleeding:  not applicable  DRAINS: (15) Blake drain(s) in the SQ   SPECIMEN:  Source of Specimen:  loop ileostomy  DISPOSITION OF SPECIMEN:  PATHOLOGY  COUNTS:  YES  PLAN OF CARE: Admit to inpatient   PATIENT DISPOSITION:  PACU - hemodynamically stable.  INDICATION: Pleasant female with very low rectal cancer status post low anterior resection with handsewn coloanal anastomosis.  Complicated by severe diarrhea breakdown and fistula development.  Awfully close with the help of a Martius flap in conjunction with Dr. Sherron Monday.  Has had fecal diversion with a loop ileostomy.  Was interested in having the ostomy taken down.  I discussed the procedure with her:  The anatomy & physiology of the digestive tract was discussed.  The pathophysiology was discussed.  Possibility of remaining with an ostomy permanently was discussed.  I offered ostomy takedown.  Laparoscopic & open techniques were discussed.   Risks such as bleeding, infection, abscess, leak, reoperation, possible re-ostomy, injury to other organs, hernia, heart attack, death, and  other risks were discussed.   I noted a good likelihood this will help address the problem.  Goals of post-operative recovery were discussed as well.  We will work to minimize complications.  Questions were answered.  The patient expresses understanding & wishes to proceed with surgery.  OR FINDINGS: She had a rather large peristomal hernia with her cecum and ileum in the subcutaneous tissues.  Minimal adhesions  DESCRIPTION:   Informed consent was confirmed.  The patient was positioned in lithotomy.She underwent general anesthesia without difficulty.  Foley catheter was placed.  Did a gentle rectal and vaginal examination.  She only had mild narrowing of her coloanal anastomosis repair.  No stricturing.  Saw no evidence of fistula or other abnormality.  Felt it was reasonable to proceed with surgery.  Her abdomen and perineum were prepped and draped in a sterile fashion.  We prepped ileostomy field.  Surgical timeout confirmed or plan.  I used a scalpel to free around the loop ileostomy mucosa from the abdominal wall skin.  I was able to free off the attachments to the dermis and encountered a moderately large hernia sac.  She had a moderate volume of thin adhesions that I freed off using sharp dissection and focused cautery.  Came down to the fascia well.  I freed off the few interloop adhesions.  We proceeded to do a side-to-side stapled anastomosis using a 75 GIA stapler from the ileum to the terminal ileum.  Closed off the staple defect using a TA 60 stapler.  We took the mesentery with clamps and 2-0 silk ties.  I closed the mesenteric defect using interrupted 2-0 silk stitches.  We returned the bowel back into  the abdominal cavity.  We did serial irrigations of isotonic crystalloid with each step until skin closure.  We switched gloves and instruments.  I released the subcutaneous tissues off the fascia to help bring the tissues together but there was some tension.  Was rather an oblique defect  about 9 x 6 cm in size.  I did place him On-Q catheter sheaths in the preperitoneal plane under direct palpation guidance prior to closing the fascia.  I closed the fascia obliquely to be the point of minimal tension using #1 looped PDS with some #1 PDS internal retention stitches as well.  I then proceeded to close the subcutaneous tissue in layers putting some deep interrupted 3-0 Vicryl stitches and deep dermal stitches and then the 4 Monocryl at the skin.  Of note I did place a drain because she had a large parastomal hernia sac and use that to help catch any excess fluid to avoid seroma formation.  I did place a few wicks between the skin.  Sterile dressings were applied.  Patient is extubated in recovery in stable condition.  I discussed postop care in detail the patient the office and in the holding area.  I discussed the patient's status to the family.  Questions were answered.  They expressed understanding & appreciation.

## 2012-03-05 NOTE — Preoperative (Signed)
Beta Blockers   Reason not to administer Beta Blockers:Not Applicable 

## 2012-03-05 NOTE — Care Management Note (Signed)
    Page 1 of 1   03/10/2012     11:38:18 AM   CARE MANAGEMENT NOTE 03/10/2012  Patient:  Emily Holloway, Emily Holloway   Account Number:  [id number]  Date Initiated:  03/05/2012  Documentation initiated by:  Lorenda Ishihara  Subjective/Objective Assessment:   56 yo female admitted s/p loop ileostomy takedown, hernia repair, lysis of adhesions. PTA lived at home with spouse.     Action/Plan:   Anticipated DC Date:  03/08/2012   Anticipated DC Plan:  HOME/SELF CARE      DC Planning Services  CM consult      Choice offered to / List presented to:             Status of service:  Completed, signed off Medicare Important Message given?   (If response is "NO", the following Medicare IM given date fields will be blank) Date Medicare IM given:   Date Additional Medicare IM given:    Discharge Disposition:  HOME/SELF CARE  Per UR Regulation:  Reviewed for med. necessity/level of care/duration of stay  If discussed at Long Length of Stay Meetings, dates discussed:    Comments:

## 2012-03-05 NOTE — Anesthesia Procedure Notes (Signed)
Procedure Name: Intubation Date/Time: 03/05/2012 7:39 AM Performed by: Edison Pace Pre-anesthesia Checklist: Patient identified, Timeout performed, Emergency Drugs available, Suction available and Patient being monitored Patient Re-evaluated:Patient Re-evaluated prior to inductionOxygen Delivery Method: Circle system utilized Preoxygenation: Pre-oxygenation with 100% oxygen Intubation Type: IV induction Ventilation: Mask ventilation without difficulty and Mask ventilation throughout procedure Laryngoscope Size: Mac and 3 Grade View: Grade II Tube type: Oral Tube size: 7.5 mm Number of attempts: 1 Airway Equipment and Method: Stylet Placement Confirmation: ETT inserted through vocal cords under direct vision,  positive ETCO2 and breath sounds checked- equal and bilateral Secured at: 21 cm Tube secured with: Tape Dental Injury: Teeth and Oropharynx as per pre-operative assessment  Difficulty Due To: Difficulty was unanticipated Future Recommendations: Recommend- induction with short-acting agent, and alternative techniques readily available

## 2012-03-05 NOTE — Anesthesia Preprocedure Evaluation (Addendum)
Anesthesia Evaluation  Patient identified by MRN, date of birth, ID band Patient awake    Reviewed: Allergy & Precautions, H&P , NPO status , Patient's Chart, lab work & pertinent test results  Airway Mallampati: II TM Distance: >3 FB Neck ROM: Full    Dental No notable dental hx.    Pulmonary neg pulmonary ROS,  breath sounds clear to auscultation  Pulmonary exam normal       Cardiovascular negative cardio ROS  Rhythm:Regular Rate:Normal     Neuro/Psych negative neurological ROS  negative psych ROS   GI/Hepatic negative GI ROS, Neg liver ROS,   Endo/Other  negative endocrine ROS  Renal/GU negative Renal ROS  negative genitourinary   Musculoskeletal negative musculoskeletal ROS (+)   Abdominal   Peds negative pediatric ROS (+)  Hematology negative hematology ROS (+)   Anesthesia Other Findings   Reproductive/Obstetrics negative OB ROS                           Anesthesia Physical Anesthesia Plan  ASA: I  Anesthesia Plan: General   Post-op Pain Management:    Induction: Intravenous  Airway Management Planned: Oral ETT  Additional Equipment:   Intra-op Plan:   Post-operative Plan: Extubation in OR  Informed Consent: I have reviewed the patients History and Physical, chart, labs and discussed the procedure including the risks, benefits and alternatives for the proposed anesthesia with the patient or authorized representative who has indicated his/her understanding and acceptance.   Dental advisory given  Plan Discussed with: CRNA  Anesthesia Plan Comments:        Anesthesia Quick Evaluation

## 2012-03-05 NOTE — H&P (Signed)
Emily Holloway  15-Jul-1956 [id number]  CARE TEAM:  PCP: Allean Found, MD  Outpatient Care Team: Patient Care Team: Allean Found, MD as PCP - General (Family Medicine) Iva Boop, MD as Consulting Physician (Gastroenterology) Ardeth Sportsman, MD (General Surgery) Martina Sinner, MD as Consulting Physician (Urology)  Inpatient Treatment Team: Treatment Team: Attending Provider: Ardeth Sportsman, MD   This patient is a 56 y.o.female who presents today for surgical evaluation.   Procedure: Redo rectovaginal fistula repair with Martius flap & vaginoplasty 10/23/2011   The patient comes in today feeling better. Some rectal drainage. Primarily mucus. She is continent and can control evacuation. Still none out her vagina. Some mild rectal cramping at times. Controlled with rare tramadol..  No new events.  Patient Active Problem List  Diagnosis  . Rectovaginal fistula  . History of rectal cancer, T2N0, Nov2010    Past Medical History  Diagnosis Date  . Rectovaginal fistula     RESOLVED SINCE LAST RECONSTRUCTIVE SURGERY 10/23/11   . Rectal carcinoma     Past Surgical History  Procedure Date  . Colon resection 2010    COLON CANCER  . Ostomy take down 2011  . Ostomy placement 2011    with RV fistula repair  . Tubal ligation   . Rectovaginal fistula closure [id number], [id number]  . Colonoscopy w/ biopsies and polypectomy 04/09/2009    adenocarcinoma  . Eus 05/03/2009    rectal adenocarcinoma  . Recto-vaginal fissure repair 10/23/2011    Procedure: REPAIR RECTO-VAGINAL FISSURE;  Surgeon: Ardeth Sportsman, MD;  Location: [facility];  Service: General;  Laterality: N/A;  Repair of Rectovaginal Fistula with Pedicle Martius Flap with Dr Lorin Picket MacDiarmid to co-surgeon   . Vesico-vaginal fistula repair 10/23/2011    Procedure: FISTULA REPAIR VESICO-VAGINAL;  Surgeon: Martina Sinner, MD;  Location: [facility];  Service: Urology;  Laterality: N/A;  martius flap    History    Social History  . Marital Status: Married    Spouse Name: N/A    Number of Children: N/A  . Years of Education: N/A   Occupational History  . Not on file.   Social History Main Topics  . Smoking status: Former Smoker -- 1.0 packs/day for 35 years  . Smokeless tobacco: Never Used   Comment: 2010 QUIT SMOKING  . Alcohol Use: No  . Drug Use: No  . Sexually Active: Not on file   Other Topics Concern  . Not on file   Social History Narrative  . No narrative on file    Family History  Problem Relation Age of Onset  . Hypertension Mother   . Cancer Father     lung  . Diabetes Father   . Cancer Brother     bladder    Current Facility-Administered Medications  Medication Dose Route Frequency Provider Last Rate Last Dose  . alvimopan (ENTEREG) capsule 12 mg  12 mg Oral Once Ardeth Sportsman, MD   12 mg at 03/05/12 1308  . cefTRIAXone (ROCEPHIN) 2 g in dextrose 5 % 50 mL IVPB  2 g Intravenous On Call to OR Ardeth Sportsman, MD      . heparin injection 5,000 Units  5,000 Units Subcutaneous Once Ardeth Sportsman, MD   5,000 Units at 03/05/12 [phone]  . metroNIDAZOLE (FLAGYL) IVPB 500 mg  500 mg Intravenous On Call to OR Ardeth Sportsman, MD         Allergies  Allergen Reactions  . Sulfonamide Derivatives  REACTION: itching    ROS: Constitutional:  No fevers, chills, sweats.  Weight stable Eyes:  No vision changes, No discharge HENT:  No sore throats, nasal drainage Lymph: No neck swelling, No bruising easily Pulmonary:  No cough, productive sputum CV: No orthopnea, PND  Patient walks 60 minutes for about 2 miles without difficulty.  No exertional chest/neck/shoulder/arm pain.  GI: No family history of GI/colon cancer, inflammatory bowel disease, irritable bowel syndrome, allergy such as Celiac Sprue, dietary/dairy problems, colitis, ulcers nor gastritis.   No recent sick contacts/gastroenteritis.  No travel outside the country.  No changes in diet. Renal: No UTIs, No  hematuria Genital:  No drainage, bleeding, masses Musculoskeletal: No severe joint pain.  Good ROM major joints Skin:  No sores or lesions.  No rashes Heme/Lymph:  No easy bleeding.  No swollen lymph nodes  BP 123/76  Pulse 84  Temp 98.5 F (36.9 C) (Oral)  Resp 16  SpO2 94%  Physical Exam: General: Pt awake/alert/oriented x4 in no major acute distress Eyes: PERRL, normal EOM. Sclera nonicteric Neuro: CN II-XII intact w/o focal sensory/motor deficits. Lymph: No head/neck/groin lymphadenopathy Psych:  No delerium/psychosis/paranoia HENT: Normocephalic, Mucus membranes moist.  No thrush Neck: Supple, No tracheal deviation Chest: No pain.  Good respiratory excursion. CV:  Pulses intact.  Regular rhythm Abdomen: Soft, Nondistended.  Ileostomy in place.  Nontender.  No incarcerated hernias. Ext:  SCDs BLE.  No significant edema.  No cyanosis Skin: No petechiae / purpurae  Results:   Labs: Results for orders placed during the hospital encounter of 03/05/12 (from the past 48 hour(s))  TYPE AND SCREEN     Status: Normal (Preliminary result)   Collection Time   03/05/12  6:27 AM      Component Value Range Comment   ABO/RH(D) A POS      Antibody Screen PENDING      Sample Expiration 03/08/2012     ABO/RH     Status: Normal   Collection Time   03/05/12  6:30 AM      Component Value Range Comment   ABO/RH(D) A POS       Imaging / Studies: No results found.  Medications / Allergies: per chart  Antibiotics: Anti-infectives     Start     Dose/Rate Route Frequency Ordered Stop   03/05/12 0621   cefTRIAXone (ROCEPHIN) 2 g in dextrose 5 % 50 mL IVPB     Comments: Pharmacy may adjust dosing strength per protocol      2 g 100 mL/hr over 30 Minutes Intravenous On call to O.R. 03/05/12 1610 03/06/12 0559   03/05/12 9604   metroNIDAZOLE (FLAGYL) IVPB 500 mg        500 mg 100 mL/hr over 60 Minutes Intravenous On call to O.R. 03/05/12 0621 03/06/12 0559          Assessment    Emily Holloway  56 y.o. 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  The pathophysiology was discussed.  Possibility of remaining with an ostomy permanently was discussed.  I offered ostomy takedown.  Laparoscopic & open techniques were discussed.   Risks such as bleeding, infection, abscess, leak, reoperation, possible re-ostomy, injury to other organs,  hernia, heart attack, death, and other risks were discussed.   I noted a good likelihood this will help address the problem.  Goals of post-operative recovery were discussed as well.  We will work to minimize complications.  Questions were answered.  The patient expresses understanding & wishes to proceed with surgery.   -VTE prophylaxis- SCDs, etc -mobilize as tolerated to help recovery  Ardeth Sportsman, M.D., F.A.C.S. Gastrointestinal and Minimally Invasive Surgery [facility] [address] [place], [place] [address] [phone] Main / Paging [phone] Voice Mail   03/05/2012

## 2012-03-05 NOTE — Transfer of Care (Signed)
Immediate Anesthesia Transfer of Care Note  Patient: Emily Holloway  Procedure(s) Performed: Procedure(s) (LRB): ILEOSTOMY TAKEDOWN (N/A) HERNIA REPAIR PARASTOMAL (N/A)  Patient Location: PACU  Anesthesia Type: General  Level of Consciousness: awake, oriented, patient cooperative, lethargic and responds to stimulation  Airway & Oxygen Therapy: Patient Spontanous Breathing and Patient connected to face mask oxygen  Post-op Assessment: Report given to PACU RN, Post -op Vital signs reviewed and stable and Patient moving all extremities  Post vital signs: Reviewed and stable  Complications: No apparent anesthesia complications

## 2012-03-05 NOTE — Anesthesia Postprocedure Evaluation (Signed)
  Anesthesia Post-op Note  Patient: Emily Holloway  Procedure(s) Performed: Procedure(s) (LRB): ILEOSTOMY TAKEDOWN (N/A) HERNIA REPAIR PARASTOMAL (N/A)  Patient Location: PACU  Anesthesia Type: General  Level of Consciousness: awake and alert   Airway and Oxygen Therapy: Patient Spontanous Breathing  Post-op Pain: mild  Post-op Assessment: Post-op Vital signs reviewed, Patient's Cardiovascular Status Stable, Respiratory Function Stable, Patent Airway and No signs of Nausea or vomiting  Post-op Vital Signs: stable  Complications: No apparent anesthesia complications

## 2012-03-06 LAB — CBC
HCT: 32.2 % — ABNORMAL LOW (ref 36.0–46.0)
Hemoglobin: 10.1 g/dL — ABNORMAL LOW (ref 12.0–15.0)
MCV: 91.7 fL (ref 78.0–100.0)
RBC: 3.51 MIL/uL — ABNORMAL LOW (ref 3.87–5.11)
WBC: 14 10*3/uL — ABNORMAL HIGH (ref 4.0–10.5)

## 2012-03-06 LAB — BASIC METABOLIC PANEL
CO2: 28 mEq/L (ref 19–32)
Chloride: 104 mEq/L (ref 96–112)
Glucose, Bld: 121 mg/dL — ABNORMAL HIGH (ref 70–99)
Sodium: 139 mEq/L (ref 135–145)

## 2012-03-06 MED ORDER — ONDANSETRON HCL 4 MG/2ML IJ SOLN
4.0000 mg | INTRAMUSCULAR | Status: DC | PRN
  Administered 2012-03-06 – 2012-03-08 (×2): 4 mg via INTRAVENOUS
  Filled 2012-03-06: qty 2

## 2012-03-06 MED ORDER — ONDANSETRON HCL 4 MG/2ML IJ SOLN
INTRAMUSCULAR | Status: AC
  Administered 2012-03-08: 4 mg via INTRAVENOUS
  Filled 2012-03-06: qty 2

## 2012-03-06 NOTE — Progress Notes (Signed)
1 Day Post-Op  Subjective: Comfortable.  Voiding well.  Feels a little bloated.  Objective: Vital signs in last 24 hours: Temp:  [97.2 F (36.2 C)-98 F (36.7 C)] 98 F (36.7 C) (08/10 1006) Pulse Rate:  [64-92] 92  (08/10 1006) Resp:  [10-18] 17  (08/10 1006) BP: (108-148)/(57-85) 109/57 mmHg (08/10 1006) SpO2:  [92 %-100 %] 92 % (08/10 1006) Weight:  [159 lb (72.122 kg)] 159 lb (72.122 kg) (08/09 1146) Last BM Date: 03/05/12  Intake/Output from previous day: 08/09 0701 - 08/10 0700 In: 4020 [P.O.:320; I.V.:3700] Out: 3690 [Urine:3500; Drains:65; Blood:25] Intake/Output this shift: Total I/O In: 480 [P.O.:480] Out: 10 [Drains:10]  PE: Abd-soft, dressings dry, serosanguinous drain output  Lab Results:   Basename 03/06/12 0457 03/05/12 1235  WBC 14.0* 14.4*  HGB 10.1* 11.7*  HCT 32.2* 37.5  PLT 313 308   BMET  Basename 03/06/12 0457 03/05/12 1235  NA 139 --  K 3.7 --  CL 104 --  CO2 28 --  GLUCOSE 121* --  BUN 6 --  CREATININE 0.54 0.60  CALCIUM 8.6 --   PT/INR No results found for this basename: LABPROT:2,INR:2 in the last 72 hours Comprehensive Metabolic Panel:    Component Value Date/Time   NA 139 03/06/2012 0457   K 3.7 03/06/2012 0457   CL 104 03/06/2012 0457   CO2 28 03/06/2012 0457   BUN 6 03/06/2012 0457   CREATININE 0.54 03/06/2012 0457   GLUCOSE 121* 03/06/2012 0457   CALCIUM 8.6 03/06/2012 0457   AST 20 04/16/2010 1400   ALT 16 04/16/2010 1400   ALKPHOS 91 04/16/2010 1400   BILITOT 0.1* 04/16/2010 1400   PROT 8.3 04/16/2010 1400   ALBUMIN 3.8 04/16/2010 1400     Studies/Results: No results found.  Anti-infectives: Anti-infectives     Start     Dose/Rate Route Frequency Ordered Stop   03/05/12 1400   metroNIDAZOLE (FLAGYL) IVPB 500 mg        500 mg 100 mL/hr over 60 Minutes Intravenous Every 6 hours 03/05/12 1144 03/06/12 0343   03/05/12 0621   cefTRIAXone (ROCEPHIN) 2 g in dextrose 5 % 50 mL IVPB     Comments: Pharmacy may adjust dosing  strength per protocol      2 g 100 mL/hr over 30 Minutes Intravenous On call to O.R. 03/05/12 1610 03/05/12 0730   03/05/12 9604   metroNIDAZOLE (FLAGYL) IVPB 500 mg        500 mg 100 mL/hr over 60 Minutes Intravenous On call to O.R. 03/05/12 0621 03/05/12 0830          Assessment Principal Problem:  *Ileostomy for fecal diversion s/p reversal 03/05/12-stable overnight Active Problems:  History of Rectovaginal fistula  History of rectal cancer, T2N0, Nov2010    LOS: 1 day   Plan: Keep on liquid diet.  Mobilize.   Zymier Rodgers J 03/06/2012

## 2012-03-07 NOTE — Progress Notes (Signed)
Pharmacy Brief Note - Alvimopan (Entereg)  The standing order set for alvimopan (Entereg) now includes an automatic order to discontinue the drug after the patient has had a bowel movement.  The change was approved by the Pharmacy & Therapeutics Committee and the Medical Executive Committee.    This patient has had a bowel movement documented by nursing.  Therefore, alvimopan has been discontinued.  If there are questions, please contact the pharmacy at [phone].  Thank you- Gwen Her PharmD  [phone] 03/07/2012 2:00 PM

## 2012-03-07 NOTE — Progress Notes (Signed)
Patient ID: Emily Holloway, female   DOB: 01-Dec-1955, 56 y.o.   MRN: [id number]  General Surgery - [facility] - Progress Note  POD# 2  Subjective: Patient with improving pain control.  Some flatus and liquid BM's.  Nausea improved.  Objective: Vital signs in last 24 hours: Temp:  [98.3 F (36.8 C)-98.9 F (37.2 C)] 98.9 F (37.2 C) (08/11 0515) Pulse Rate:  [88-93] 88  (08/11 0515) Resp:  [16-18] 18  (08/11 0515) BP: (115-134)/(77-81) 115/79 mmHg (08/11 0515) SpO2:  [93 %-100 %] 99 % (08/11 0515) Last BM Date: 03/05/12  Intake/Output from previous day: 08/10 0701 - 08/11 0700 In: 2461.3 [P.O.:680; I.V.:1781.3] Out: 910 [Urine:900; Drains:10]  Exam: HEENT - clear, not icteric Neck - soft Chest - clear bilaterally Cor - RRR, no murmur Abd - mild distension; dressings intact; drain with serosanguinous; On-Q in place Ext - no significant edema Neuro - grossly intact, no focal deficits  Lab Results:   Basename 03/06/12 0457 03/05/12 1235  WBC 14.0* 14.4*  HGB 10.1* 11.7*  HCT 32.2* 37.5  PLT 313 308     Basename 03/06/12 0457 03/05/12 1235  NA 139 --  K 3.7 --  CL 104 --  CO2 28 --  GLUCOSE 121* --  BUN 6 --  CREATININE 0.54 0.60  CALCIUM 8.6 --    Studies/Results: No results found.  Assessment / Plan: 1.  Status post ileostomy closure  - good pain control on current regimen  - nausea resolving  - OOB, ambulate  Velora Heckler, MD, [facility] Office: [phone]  03/07/2012

## 2012-03-08 ENCOUNTER — Encounter (HOSPITAL_COMMUNITY): Payer: Self-pay | Admitting: Surgery

## 2012-03-08 MED ORDER — BISMUTH SUBSALICYLATE 262 MG/15ML PO SUSP
30.0000 mL | Freq: Three times a day (TID) | ORAL | Status: DC | PRN
  Filled 2012-03-08: qty 236

## 2012-03-08 NOTE — Progress Notes (Signed)
Emily Holloway [id number] [birth year]  CARE TEAM:  PCP: Allean Found, MD  Outpatient Care Team: Patient Care Team: Candace Darolyn Rua, MD as PCP - General (Family Medicine) Iva Boop, MD as Consulting Physician (Gastroenterology) Ardeth Sportsman, MD (General Surgery) Martina Sinner, MD as Consulting Physician (Urology)  Inpatient Treatment Team: Treatment Team: Attending Provider: Ardeth Sportsman, MD; Technician: Melvenia Beam, NT; Registered Nurse: Vivien Rossetti, RN; Registered Nurse: Patsey Berthold, RN  Subjective:  Less nausea Tramadol controlling most pain Wants to try walking more   Objective:  Vital signs:  Filed Vitals:   03/07/12 0515 03/07/12 1400 03/07/12 2155 03/08/12 0533  BP: 115/79 144/88 124/85 115/79  Pulse: 88 93 104 88  Temp: 98.9 F (37.2 C) 98.2 F (36.8 C) 98 F (36.7 C) 97.7 F (36.5 C)  TempSrc: Oral Oral Oral Oral  Resp: 18 16 18 16   Height:      Weight:      SpO2: 99% 98% 94% 98%    Last BM Date: 03/07/12  Intake/Output   Yesterday:  08/11 0701 - 08/12 0700 In: 1860 [P.O.:60; I.V.:1800] Out: 735 [Urine:725; Drains:10] This shift:     Bowel function:  Flatus: y  BM: y  Physical Exam:  General: Pt awake/alert/oriented x4 in no acute distress Eyes: PERRL, normal EOM.  Sclera clear.  No icterus Neuro: CN II-XII intact w/o focal sensory/motor deficits. Lymph: No head/neck/groin lymphadenopathy Psych:  No delerium/psychosis/paranoia HENT: Normocephalic, Mucus membranes moist.  No thrush Neck: Supple, No tracheal deviation Chest: No chest wall pain w good excursion CV:  Pulses intact.  Regular rhythm Abdomen: Soft.  Nondistended.  Dressings & OnQ catheters removed.  Mildly tender at incisions only.  No incarcerated hernias. Ext:  SCDs BLE.  No mjr edema.  No cyanosis Skin: No petechiae / purpurae  Problem List:  Principal Problem:  *Ileostomy for fecal diversion Active Problems:  Rectovaginal  fistula  History of rectal cancer, T2N0, Nov2010   Assessment  Emily Holloway  56 y.o. female  3 Days Post-Op  Procedure(s): ILEOSTOMY TAKEDOWN HERNIA REPAIR PARASTOMAL  Ileus resolving  Plan:  -Adv diet -VTE prophylaxis- SCDs, etc -mobilize as tolerated to help recovery  Ardeth Sportsman, M.D., F.A.C.S. Gastrointestinal and Minimally Invasive Surgery [facility] [address] [place], [place] [address] [phone] Main / Paging [phone] Voice Mail   03/08/2012  Results:   Labs: No results found for this or any previous visit (from the past 48 hour(s)).  Imaging / Studies: No results found.  Medications / Allergies: per chart  Antibiotics: Anti-infectives     Start     Dose/Rate Route Frequency Ordered Stop   03/05/12 1400   metroNIDAZOLE (FLAGYL) IVPB 500 mg        500 mg 100 mL/hr over 60 Minutes Intravenous Every 6 hours 03/05/12 1144 03/06/12 0343   03/05/12 0621   cefTRIAXone (ROCEPHIN) 2 g in dextrose 5 % 50 mL IVPB     Comments: Pharmacy may adjust dosing strength per protocol      2 g 100 mL/hr over 30 Minutes Intravenous On call to O.R. 03/05/12 5638 03/05/12 0730   03/05/12 7564   metroNIDAZOLE (FLAGYL) IVPB 500 mg        500 mg 100 mL/hr over 60 Minutes Intravenous On call to O.R. 03/05/12 3329 03/05/12 0830

## 2012-03-09 MED ORDER — SODIUM CHLORIDE 0.9 % IJ SOLN
3.0000 mL | Freq: Two times a day (BID) | INTRAMUSCULAR | Status: DC
  Administered 2012-03-09: 3 mL via INTRAVENOUS

## 2012-03-09 MED ORDER — SODIUM CHLORIDE 0.9 % IJ SOLN: 3.0000 mL | INTRAMUSCULAR | Status: DC | PRN

## 2012-03-09 MED ORDER — LACTATED RINGERS IV BOLUS (SEPSIS): 1000.0000 mL | Freq: Three times a day (TID) | INTRAVENOUS | Status: DC | PRN

## 2012-03-09 MED ORDER — PSYLLIUM 95 % PO PACK
1.0000 | PACK | Freq: Every day | ORAL | Status: DC
  Filled 2012-03-09 (×2): qty 1

## 2012-03-09 NOTE — Progress Notes (Signed)
Emily Holloway [id number] May 28, 1956  CARE TEAM:  PCP: Allean Found, MD  Outpatient Care Team: Patient Care Team: Candace Darolyn Rua, MD as PCP - General (Family Medicine) Iva Boop, MD as Consulting Physician (Gastroenterology) Ardeth Sportsman, MD (General Surgery) Martina Sinner, MD as Consulting Physician (Urology)  Inpatient Treatment Team: Treatment Team: Attending Provider: Ardeth Sportsman, MD; Technician: Melvenia Beam, NT; Registered Nurse: Vivien Rossetti, RN; Registered Nurse: Patsey Berthold, RN; Registered Nurse: Horton Marshall  Subjective:  Some nausea Feels a lot of stomach rumbling Tramadol controlling most pain Walking more   Objective:  Vital signs:  Filed Vitals:   03/08/12 1400 03/08/12 2120 03/08/12 2136 03/09/12 0537  BP: 128/80 123/84  113/75  Pulse: 102 102 88 90  Temp: 98.7 F (37.1 C) 98 F (36.7 C)  97.4 F (36.3 C)  TempSrc: Oral Oral  Oral  Resp: 20 18  18   Height:      Weight:      SpO2: 94% 89% 98% 98%    Last BM Date: 03/08/12  Intake/Output   Yesterday:  08/12 0701 - 08/13 0700 In: 2282.9 [P.O.:960; I.V.:1322.9] Out: 940 [Urine:900; Drains:40] This shift:     Bowel function:  Flatus: y  BM: y  Physical Exam:  General: Pt awake/alert/oriented x4 in no acute distress Eyes: PERRL, normal EOM.  Sclera clear.  No icterus Neuro: CN II-XII intact w/o focal sensory/motor deficits. Lymph: No head/neck/groin lymphadenopathy Psych:  No delerium/psychosis/paranoia HENT: Normocephalic, Mucus membranes moist.  No thrush Neck: Supple, No tracheal deviation Chest: No chest wall pain w good excursion CV:  Pulses intact.  Regular rhythm Abdomen: Soft.  Nondistended.  Incision clean.  Mildly tender at incision only.  No incarcerated hernias. Ext:  SCDs BLE.  No mjr edema.  No cyanosis Skin: No petechiae / purpurae  Problem List:  Principal Problem:  *Ileostomy for fecal diversion Active Problems:  Rectovaginal fistula  History of rectal cancer, T2N0, Nov2010   Assessment  Emily Holloway  56 y.o. female  4 Days Post-Op  Procedure(s): ILEOSTOMY TAKEDOWN HERNIA REPAIR PARASTOMAL  Ileus resolving  Plan:  -Adv diet -Nausea control -Modify bowel regimen -VTE prophylaxis- SCDs, etc -mobilize as tolerated to help recovery -wean off IVF -try to get off oxygen w post-op hypoxia  Ardeth Sportsman, M.D., F.A.C.S. Gastrointestinal and Minimally Invasive Surgery [facility] [address] [place], [place] [address] [phone] Main / Paging [phone] Voice Mail   03/09/2012  Results:   Labs: No results found for this or any previous visit (from the past 48 hour(s)).  Imaging / Studies: No results found.  Medications / Allergies: per chart  Antibiotics: Anti-infectives     Start     Dose/Rate Route Frequency Ordered Stop   03/05/12 1400   metroNIDAZOLE (FLAGYL) IVPB 500 mg        500 mg 100 mL/hr over 60 Minutes Intravenous Every 6 hours 03/05/12 1144 03/06/12 0343   03/05/12 0621   cefTRIAXone (ROCEPHIN) 2 g in dextrose 5 % 50 mL IVPB     Comments: Pharmacy may adjust dosing strength per protocol      2 g 100 mL/hr over 30 Minutes Intravenous On call to O.R. 03/05/12 9528 03/05/12 0730   03/05/12 4132   metroNIDAZOLE (FLAGYL) IVPB 500 mg        500 mg 100 mL/hr over 60 Minutes Intravenous On call to O.R. 03/05/12 4401 03/05/12 0830

## 2012-03-09 NOTE — Plan of Care (Signed)
Problem: Phase II Progression Outcomes Goal: Progressing with IS, TCDB Outcome: Progressing Instructed pt on use of IS- pt voiced understanding w/ return demonstration

## 2012-03-10 DIAGNOSIS — K435 Parastomal hernia without obstruction or  gangrene: Secondary | ICD-10-CM | POA: Diagnosis present

## 2012-03-10 HISTORY — DX: Parastomal hernia without obstruction or gangrene: K43.5

## 2012-03-10 NOTE — Progress Notes (Signed)
Discharge instructions reviewed with patient, vital signs are stable, patient is tolerating diet without complaints of nausea or vomiting, prescription given, patient is to follow up with MD within 1 week, bowel sounds are within normal limits, questions and concerns answered Means, Myrtie Hawk RN 03-10-2012 10:20am

## 2012-03-10 NOTE — Discharge Summary (Signed)
Physician Discharge Summary  Patient ID: Emily Holloway MRN: [id number] DOB/AGE: [birth year] 56 y.o.  Admit date: 03/05/2012 Discharge date: 03/10/2012  Admission Diagnoses: Principal Problem:  *Ileostomy for fecal diversion Active Problems:  Rectovaginal fistula  History of rectal cancer, T2N0, Nov2010  Discharge Diagnoses:  Principal Problem:  *Ileostomy for fecal diversion Active Problems:  Rectovaginal fistula  History of rectal cancer, T2N0, Nov2010  Parastomal hernia s/p primary repair Aug2013   Discharged Condition: good  Hospital Course: Pleasant woman with very low rectal cancer status post excision with coloanal handsewn anastomosis.  Developed a delayed rectovaginal fistula after ileostomy takedown.  Required repeated repairs.  Healed with a rotation flap repair.  No evidence of cancer recurrence.  Underwent ileostomy takedown at admission.  Was found to have a moderate sized parastomal hernia.  This was primarily repaired.  Postoperatively, the patient was placed on an anti-ileus protocol.  The patient mobilized and advanced to a solid diet gradually.  Pain was well-controlled and transitioned off IV medications.    By the time of discharge, the patient was walking well the hallways, eating food well, having flatus.  Pain was-controlled on an oral regimen.  Based on meeting DC criteria and recovering well, I felt it was safe for the patient to be discharged home with close followup.  Instructions were discussed in detail.  They are written as well.  Consults: None  Significant Diagnostic Studies:   Treatments: surgery: Ileostomy takedown.  Repair of parastomal VWH  Discharge Exam: Blood pressure 119/78, pulse 102, temperature 98.4 F (36.9 C), temperature source Oral, resp. rate 16, height 5\' 2"  (1.575 m), weight 159 lb (72.122 kg), SpO2 92.00%.  General: Pt awake/alert/oriented x4 in no major acute distress Eyes: PERRL, normal EOM. Sclera nonicteric Neuro: CN  II-XII intact w/o focal sensory/motor deficits. Lymph: No head/neck/groin lymphadenopathy Psych:  No delerium/psychosis/paranoia HENT: Normocephalic, Mucus membranes moist.  No thrush Neck: Supple, No tracheal deviation Chest: No pain.  Good respiratory excursion. CV:  Pulses intact.  Regular rhythm Abdomen: Soft, Nondistended.  Min tender at incision.  Drain serosanguinous.  No incarcerated hernias. Ext:  SCDs BLE.  No significant edema.  No cyanosis Skin: No petechiae / purpurae   Disposition: 01-Home or Self Care  Discharge Orders    Future Appointments: Provider: Department: Dept Phone: Center:   03/16/2012 10:00 AM Ardeth Sportsman, MD [facility] [phone] None     Future Orders Please Complete By Expires   Diet - low sodium heart healthy      Increase activity slowly        Medication List  As of 03/10/2012  7:05 AM   TAKE these medications         CALCIUM SOFT CHEWS PO   Take 600 Units by mouth 2 (two) times daily.      PYCNOGENOL PO   Take 50 mg by mouth every morning.      traMADol 50 MG tablet   Commonly known as: ULTRAM   Take 1-2 tablets (50-100 mg total) by mouth every 6 (six) hours as needed for pain. For pain           Follow-up Information    Follow up with Rick Carruthers C., MD. Schedule an appointment as soon as possible for a visit in 10 days.   Contact information:   [address] [place] [address] [phone]          Signed: Ardeth Sportsman. 03/10/2012, 7:05 AM

## 2012-03-16 ENCOUNTER — Encounter (INDEPENDENT_AMBULATORY_CARE_PROVIDER_SITE_OTHER): Payer: Self-pay | Admitting: Surgery

## 2012-03-16 ENCOUNTER — Ambulatory Visit (INDEPENDENT_AMBULATORY_CARE_PROVIDER_SITE_OTHER): Payer: 59 | Admitting: Surgery

## 2012-03-16 ENCOUNTER — Telehealth (INDEPENDENT_AMBULATORY_CARE_PROVIDER_SITE_OTHER): Payer: Self-pay

## 2012-03-16 ENCOUNTER — Other Ambulatory Visit (INDEPENDENT_AMBULATORY_CARE_PROVIDER_SITE_OTHER): Payer: Self-pay | Admitting: Surgery

## 2012-03-16 VITALS — BP 108/76 | HR 60 | Temp 97.5°F | Resp 16 | Ht 62.0 in | Wt 145.6 lb

## 2012-03-16 DIAGNOSIS — K589 Irritable bowel syndrome without diarrhea: Secondary | ICD-10-CM

## 2012-03-16 DIAGNOSIS — R112 Nausea with vomiting, unspecified: Secondary | ICD-10-CM

## 2012-03-16 DIAGNOSIS — K435 Parastomal hernia without obstruction or  gangrene: Secondary | ICD-10-CM

## 2012-03-16 DIAGNOSIS — N824 Other female intestinal-genital tract fistulae: Secondary | ICD-10-CM

## 2012-03-16 DIAGNOSIS — N823 Fistula of vagina to large intestine: Secondary | ICD-10-CM

## 2012-03-16 DIAGNOSIS — K469 Unspecified abdominal hernia without obstruction or gangrene: Secondary | ICD-10-CM

## 2012-03-16 LAB — URINALYSIS, ROUTINE W REFLEX MICROSCOPIC
Glucose, UA: NEGATIVE mg/dL
Leukocytes, UA: NEGATIVE
Protein, ur: 30 mg/dL — AB
Specific Gravity, Urine: 1.02 (ref 1.005–1.030)
Urobilinogen, UA: 0.2 mg/dL (ref 0.0–1.0)

## 2012-03-16 LAB — CBC WITH DIFFERENTIAL/PLATELET
Basophils Relative: 1 % (ref 0–1)
Eosinophils Absolute: 0 10*3/uL (ref 0.0–0.7)
Eosinophils Relative: 0 % (ref 0–5)
HCT: 38.3 % (ref 36.0–46.0)
Hemoglobin: 13.3 g/dL (ref 12.0–15.0)
Lymphs Abs: 1.2 10*3/uL (ref 0.7–4.0)
MCH: 29.4 pg (ref 26.0–34.0)
MCHC: 34.7 g/dL (ref 30.0–36.0)
MCV: 84.7 fL (ref 78.0–100.0)
Monocytes Absolute: 1.1 10*3/uL — ABNORMAL HIGH (ref 0.1–1.0)
Monocytes Relative: 10 % (ref 3–12)
Neutrophils Relative %: 78 % — ABNORMAL HIGH (ref 43–77)

## 2012-03-16 LAB — URINALYSIS, MICROSCOPIC ONLY
Bacteria, UA: NONE SEEN
Crystals: NONE SEEN

## 2012-03-16 LAB — COMPREHENSIVE METABOLIC PANEL
Alkaline Phosphatase: 68 U/L (ref 39–117)
BUN: 9 mg/dL (ref 6–23)
CO2: 25 mEq/L (ref 19–32)
Glucose, Bld: 113 mg/dL — ABNORMAL HIGH (ref 70–99)
Total Bilirubin: 0.5 mg/dL (ref 0.3–1.2)

## 2012-03-16 MED ORDER — PROMETHAZINE HCL 12.5 MG PO TABS: 12.5000 mg | ORAL_TABLET | Freq: Four times a day (QID) | ORAL | Status: DC | PRN

## 2012-03-16 MED ORDER — HYOSCYAMINE SULFATE 0.125 MG SL SUBL: 0.1250 mg | SUBLINGUAL_TABLET | SUBLINGUAL | Status: DC | PRN

## 2012-03-16 NOTE — Patient Instructions (Addendum)
Take the promethazine and hyoscyamine for nausea and cramps.  Consider a probiotic-like Acitivia or Align to help get your bowels better.  Take Kaopectate twice a day to help thicken up her stools. Consider adding low-dose fiber such as Benefiber to help thicken up her stools as well.  Plan to see you next week to probably remove the drain then.  If you're not getting better or worse please call.  Please call us for results after getting blood and urine tests today.    GETTING TO GOOD BOWEL HEALTH. Irregular bowel habits such as constipation and diarrhea can lead to many problems over time.  Having one soft bowel movement a day is the most important way to prevent further problems.  The anorectal canal is designed to handle stretching and feces to safely manage our ability to get rid of solid waste (feces, poop, stool) out of our body.  BUT, hard constipated stools can act like ripping concrete bricks and diarrhea can be a burning fire to this very sensitive area of our body, causing inflamed hemorrhoids, anal fissures, increasing risk is perirectal abscesses, abdominal pain/bloating, an making irritable bowel worse.     The goal: ONE SOFT BOWEL MOVEMENT A DAY!  To have soft, regular bowel movements:    Drink at least 8 tall glasses of water a day.     Take plenty of fiber.  Fiber is the undigested part of plant food that passes into the colon, acting s "natures broom" to encourage bowel motility and movement.  Fiber can absorb and hold large amounts of water. This results in a larger, bulkier stool, which is soft and easier to pass. Work gradually over several weeks up to 6 servings a day of fiber (25g a day even more if needed) in the form of: o Vegetables -- Root (potatoes, carrots, turnips), leafy green (lettuce, salad greens, celery, spinach), or cooked high residue (cabbage, broccoli, etc) o Fruit -- Fresh (unpeeled skin & pulp), Dried (prunes, apricots, cherries, etc ),  or stewed (  applesauce)  o Whole grain breads, pasta, etc (whole wheat)  o Bran cereals    Bulking Agents -- This type of water-retaining fiber generally is easily obtained each day by one of the following:  o Psyllium bran -- The psyllium plant is remarkable because its ground seeds can retain so much water. This product is available as Metamucil, Konsyl, Effersyllium, Per Diem Fiber, or the less expensive generic preparation in drug and health food stores. Although labeled a laxative, it really is not a laxative.  o Methylcellulose -- This is another fiber derived from wood which also retains water. It is available as Citrucel. o Polyethylene Glycol - and "artificial" fiber commonly called Miralax or Glycolax.  It is helpful for people with gassy or bloated feelings with regular fiber o Flax Seed - a less gassy fiber than psyllium   No reading or other relaxing activity while on the toilet. If bowel movements take longer than 5 minutes, you are too constipated   AVOID CONSTIPATION.  High fiber and water intake usually takes care of this.  Sometimes a laxative is needed to stimulate more frequent bowel movements, but    Laxatives are not a good long-term solution as it can wear the colon out. o Osmotics (Milk of Magnesia, Fleets phosphosoda, Magnesium citrate, MiraLax, GoLytely) are safer than  o Stimulants (Senokot, Castor Oil, Dulcolax, Ex Lax)    o Do not take laxatives for more than 7days in a row.  IF SEVERELY CONSTIPATED, try a Bowel Retraining Program: o Do not use laxatives.  o Eat a diet high in roughage, such as bran cereals and leafy vegetables.  o Drink six (6) ounces of prune or apricot juice each morning.  o Eat two (2) large servings of stewed fruit each day.  o Take one (1) heaping tablespoon of a psyllium-based bulking agent twice a day. Use sugar-free sweetener when possible to avoid excessive calories.  o Eat a normal breakfast.  o Set aside 15 minutes after breakfast to sit on the  toilet, but do not strain to have a bowel movement.  o If you do not have a bowel movement by the third day, use an enema and repeat the above steps.    Controlling diarrhea o Switch to liquids and simpler foods for a few days to avoid stressing your intestines further. o Avoid dairy products (especially milk & ice cream) for a short time.  The intestines often can lose the ability to digest lactose when stressed. o Avoid foods that cause gassiness or bloating.  Typical foods include beans and other legumes, cabbage, broccoli, and dairy foods.  Every person has some sensitivity to other foods, so listen to our body and avoid those foods that trigger problems for you. o Adding fiber (Citrucel, Metamucil, psyllium, Miralax) gradually can help thicken stools by absorbing excess fluid and retrain the intestines to act more normally.  Slowly increase the dose over a few weeks.  Too much fiber too soon can backfire and cause cramping & bloating. o Probiotics (such as active yogurt, Align, etc) may help repopulate the intestines and colon with normal bacteria and calm down a sensitive digestive tract.  Most studies show it to be of mild help, though, and such products can be costly. o Medicines:   Bismuth subsalicylate (ex. Kayopectate, Pepto Bismol) every 30 minutes for up to 6 doses can help control diarrhea.  Avoid if pregnant.   Loperamide (Immodium) can slow down diarrhea.  Start with two tablets (4mg  total) first and then try one tablet every 6 hours.  Avoid if you are having fevers or severe pain.  If you are not better or start feeling worse, stop all medicines and call your doctor for advice o Call your doctor if you are getting worse or not better.  Sometimes further testing (cultures, endoscopy, X-ray studies, bloodwork, etc) may be needed to help diagnose and treat the cause of the diarrhea. o

## 2012-03-16 NOTE — Telephone Encounter (Signed)
Called pt to notify her that her labs were not alarming but showing some dehydration going on per Dr Michaell Cowing. The pt has been advised to drink Gatorade and her shakes. The pt did say since she has been home she was able to drink a whole shake and kept it down with no problem. The pt was able to get some rest today and did feel some better than this a.m. I advised her if any changes between now and next week she needed to call not wait till her appt with [place] on Wednesday. The pt understands.

## 2012-03-16 NOTE — Progress Notes (Signed)
Subjective:     Patient ID: Emily Holloway, female   DOB: [birth year], 56 y.o.   MRN: [id number]  HPI  Emily Holloway  09/06/1955 [id number]  Patient Care Team: Allean Found, MD as PCP - General (Family Medicine) Iva Boop, MD as Consulting Physician (Gastroenterology) Ardeth Sportsman, MD (General Surgery) Martina Sinner, MD as Consulting Physician (Urology)  This patient is a 56 y.o.female who presents today for surgical evaluation.   Procedure: Takedown of ileostomy and parastomal primary her repair 03/05/2012  The patient comes today with her husband struggling.  She's had issues with nausea and vomiting and bloating.  Did not call us.  Did not try nausea meds.  Began to have flatus and is moving bowels better now.  Still trying to use the bathroom every three hours but usually just gas.  Stool thickening up.  Eating fruits and drinking water only.  Not trying any supplemental shakes is recommended.  Not tried to eat solid food.  Denies feeling lightheaded or dizzy.  Just nauseated.  Denies any abdominal wall pain.  Just crampiness and bloating.  Has not tried any probiotics nor Kaopectate nor any light dose fiber as we had discussed on D/C.  Her husband the has been trying to get her to eat.  She just feels a lot of early satiety and bloating.  Terminal working fine for soreness.  Drainage from the JP drain in the subcutaneous over the parastomal hernia hernia repair is putting out less.  No vaginal bleeding or discharge.  No deep pelvic or rectal pain.  Most of her discomfort is been diffuse intermittent crampy pain  Patient Active Problem List  Diagnosis  . Rectovaginal fistula  . History of rectal cancer, T2N0, Nov2010  . Parastomal hernia s/p primary repair [id number]    Past Medical History  Diagnosis Date  . Rectovaginal fistula     RESOLVED SINCE LAST RECONSTRUCTIVE SURGERY 10/23/11   . Rectal carcinoma     Past Surgical History  Procedure Date  . Colon  resection 2010    COLON CANCER  . Ostomy take down 2011  . Ostomy placement 2011    with RV fistula repair  . Tubal ligation   . Rectovaginal fistula closure [id number], [id number]  . Colonoscopy w/ biopsies and polypectomy 04/09/2009    adenocarcinoma  . Eus 05/03/2009    rectal adenocarcinoma  . Recto-vaginal fissure repair 10/23/2011    Procedure: REPAIR RECTO-VAGINAL FISSURE;  Surgeon: Ardeth Sportsman, MD;  Location: [facility];  Service: General;  Laterality: N/A;  Repair of Rectovaginal Fistula with Pedicle Martius Flap with Dr Lorin Picket MacDiarmid to co-surgeon   . Vesico-vaginal fistula repair 10/23/2011    Procedure: FISTULA REPAIR VESICO-VAGINAL;  Surgeon: Martina Sinner, MD;  Location: [facility];  Service: Urology;  Laterality: N/A;  martius flap  . Ileostomy closure 03/05/2012    Procedure: ILEOSTOMY TAKEDOWN;  Surgeon: Ardeth Sportsman, MD;  Location: [facility];  Service: General;  Laterality: N/A;  . Parastomal hernia repair 03/05/2012    Procedure: HERNIA REPAIR PARASTOMAL;  Surgeon: Ardeth Sportsman, MD;  Location: [facility];  Service: General;  Laterality: N/A;    History   Social History  . Marital Status: Married    Spouse Name: N/A    Number of Children: N/A  . Years of Education: N/A   Occupational History  . Not on file.   Social History Main Topics  . Smoking status: Former Smoker -- 1.0 packs/day for 35 years  .  Smokeless tobacco: Never Used   Comment: 2010 QUIT SMOKING  . Alcohol Use: No  . Drug Use: No  . Sexually Active: Not on file   Other Topics Concern  . Not on file   Social History Narrative  . No narrative on file    Family History  Problem Relation Age of Onset  . Hypertension Mother   . Cancer Father     lung  . Diabetes Father   . Cancer Brother     bladder    Current Outpatient Prescriptions  Medication Sig Dispense Refill  . Calcium-Vitamin D-Vitamin K (CALCIUM SOFT CHEWS PO) Take 600 Units by mouth 2 (two) times daily.       . Nutritional  Supplements (PYCNOGENOL PO) Take 50 mg by mouth every morning.       . traMADol (ULTRAM) 50 MG tablet Take 1-2 tablets (50-100 mg total) by mouth every 6 (six) hours as needed for pain. For pain  50 tablet  0     Allergies  Allergen Reactions  . Oxycodone Nausea Only  . Sulfonamide Derivatives     REACTION: itching    BP 108/76  Pulse 60  Temp 97.5 F (36.4 C) (Temporal)  Resp 16  Ht 5\' 2"  (1.575 m)  Wt 145 lb 9.6 oz (66.044 kg)  BMI 26.63 kg/m2  No results found.   Review of Systems  Constitutional: Positive for appetite change and fatigue. Negative for fever, chills and diaphoresis.  HENT: Negative for ear pain, sore throat and trouble swallowing.   Eyes: Negative for photophobia and visual disturbance.  Respiratory: Negative for cough and choking.   Cardiovascular: Negative for chest pain and palpitations.  Gastrointestinal: Positive for nausea and vomiting. Negative for abdominal pain, diarrhea, constipation, blood in stool, abdominal distention, anal bleeding and rectal pain.  Genitourinary: Negative for dysuria, frequency, vaginal bleeding, vaginal discharge, difficulty urinating and vaginal pain.  Musculoskeletal: Negative for myalgias, back pain, arthralgias and gait problem.  Skin: Negative for color change, pallor and rash.  Neurological: Negative for dizziness, speech difficulty, weakness and numbness.  Hematological: Negative for adenopathy.  Psychiatric/Behavioral: Negative for confusion and agitation. The patient is not nervous/anxious.        Objective:   Physical Exam  Constitutional: She is oriented to person, place, and time. She appears well-developed and well-nourished. She is cooperative.  Non-toxic appearance. She has a sickly appearance. No distress.  HENT:  Head: Normocephalic.  Mouth/Throat: Oropharynx is clear and moist. No oropharyngeal exudate.  Eyes: Conjunctivae and EOM are normal. Pupils are equal, round, and reactive to light. No scleral  icterus.  Neck: Normal range of motion. No tracheal deviation present.  Cardiovascular: Normal rate and intact distal pulses.   Pulmonary/Chest: Effort normal. No respiratory distress. She exhibits no tenderness.  Abdominal: Soft. Bowel sounds are normal. She exhibits no distension and no mass. There is no tenderness. There is no rebound and no guarding. Hernia confirmed negative in the right inguinal area and confirmed negative in the left inguinal area.         Incisions clean with normal healing ridges.  No hernias  Genitourinary: No vaginal discharge found.  Musculoskeletal: Normal range of motion. She exhibits no tenderness.  Lymphadenopathy:       Right: No inguinal adenopathy present.       Left: No inguinal adenopathy present.  Neurological: She is alert and oriented to person, place, and time. No cranial nerve deficit. She exhibits normal muscle tone. Coordination normal.  Skin:  Skin is warm and dry. No rash noted. She is not diaphoretic.  Psychiatric: She has a normal mood and affect. Her behavior is normal.       Assessment:     Struggling now postoperative day #11 status post ileostomy takedown and parastomal hernia repair    Plan:     Try to control nausea more aggressively.  Start out with Phenergan.  Will use Zofran if that does not work.  Try Levsin for cramping.  I suspect she has an irritable bowel component all this.  Consider trying probiotics and may be just a light dose fiber.  Consider Kaopectate the thickened and seeing things out.  Get blood work and urinalysis to see what's going on.  If abnormal, I strongly urged her to consider admission.  Did offer to admit her right now.  She did not want to do that.  Her husband is going to take off work to watch her more closely.  I heartily agreed.  Strongly recommend she try supplemental shakes and try and eat some protein.  I cautioned her that liquids only is not a good long-term solution and get her in worsening  trouble.  I recommend she try and get it moving around more as tolerated.  Being bed ridden cancer in a more serious trouble.  I think she understands  We'll continue to follow her closely at the very least

## 2012-03-17 LAB — URINE CULTURE
Colony Count: NO GROWTH
Organism ID, Bacteria: NO GROWTH

## 2012-03-22 ENCOUNTER — Telehealth (INDEPENDENT_AMBULATORY_CARE_PROVIDER_SITE_OTHER): Payer: Self-pay | Admitting: General Surgery

## 2012-03-22 ENCOUNTER — Other Ambulatory Visit (INDEPENDENT_AMBULATORY_CARE_PROVIDER_SITE_OTHER): Payer: Self-pay | Admitting: General Surgery

## 2012-03-22 DIAGNOSIS — N824 Other female intestinal-genital tract fistulae: Secondary | ICD-10-CM

## 2012-03-22 NOTE — Telephone Encounter (Signed)
Patient called in stating that she believes the fistula that was repaired in March has opened back up. Patient stated that she is now passing fecal matter from her vagina. Patient very concerned and thinks she needs to have surgery again. Advised the patient Dr. Michaell Cowing would be paged and told of her situation, she will be contacted back. Patient agreed.

## 2012-03-22 NOTE — Telephone Encounter (Signed)
Called patient to advise Dr. Michaell Cowing requested she keep her appointment to see him on 03/24/12. Also advised tests have been submitted that she will need to have done at [facility]. Advised the patient to have the blood work and CT done today so that the test results can be received in time for the appointment. The patient agreed.

## 2012-03-22 NOTE — Telephone Encounter (Signed)
Please obtain CT scan abd/pelvis to r/o abscess.  Prob needs MRI pelvis to evaluate fistula & make sure nothing else is going on.  Order CBC, CMET, UA w micro, CEA as well

## 2012-03-23 ENCOUNTER — Other Ambulatory Visit (INDEPENDENT_AMBULATORY_CARE_PROVIDER_SITE_OTHER): Payer: Self-pay | Admitting: General Surgery

## 2012-03-23 ENCOUNTER — Telehealth (INDEPENDENT_AMBULATORY_CARE_PROVIDER_SITE_OTHER): Payer: Self-pay

## 2012-03-23 ENCOUNTER — Ambulatory Visit (HOSPITAL_COMMUNITY)
Admission: RE | Admit: 2012-03-23 | Discharge: 2012-03-23 | Disposition: A | Discharge status: Home / Self Care | Payer: 59 | Source: Ambulatory Visit | Attending: Surgery | Admitting: Surgery

## 2012-03-23 DIAGNOSIS — N824 Other female intestinal-genital tract fistulae: Secondary | ICD-10-CM

## 2012-03-23 DIAGNOSIS — N823 Fistula of vagina to large intestine: Secondary | ICD-10-CM

## 2012-03-23 LAB — CBC
MCH: 29.1 pg (ref 26.0–34.0)
MCV: 90.1 fL (ref 78.0–100.0)
Platelets: 550 10*3/uL — ABNORMAL HIGH (ref 150–400)
RBC: 4.05 MIL/uL (ref 3.87–5.11)

## 2012-03-23 LAB — URINALYSIS, ROUTINE W REFLEX MICROSCOPIC
Leukocytes, UA: NEGATIVE
Protein, ur: NEGATIVE mg/dL
Urobilinogen, UA: 0.2 mg/dL (ref 0.0–1.0)

## 2012-03-23 LAB — COMPREHENSIVE METABOLIC PANEL
Albumin: 2.8 g/dL — ABNORMAL LOW (ref 3.5–5.2)
BUN: 6 mg/dL (ref 6–23)
CO2: 30 mEq/L (ref 19–32)
Chloride: 91 mEq/L — ABNORMAL LOW (ref 96–112)
Creatinine, Ser: 0.56 mg/dL (ref 0.50–1.10)
GFR calc non Af Amer: >90 mL/min (ref >90)
Total Bilirubin: 0.3 mg/dL (ref 0.3–1.2)

## 2012-03-23 LAB — URINE MICROSCOPIC-ADD ON

## 2012-03-23 MED ORDER — IOHEXOL 300 MG/ML  SOLN
100.0000 mL | Freq: Once | INTRAMUSCULAR | Status: AC | PRN
  Administered 2012-03-23: 100 mL via INTRAVENOUS

## 2012-03-23 NOTE — Telephone Encounter (Signed)
LMOM for pt to call me but I know the pt is getting her CT scan done now at [facility].

## 2012-03-23 NOTE — Telephone Encounter (Signed)
Called Emily Holloway to check on her and see about the CT scan with labs being done. The Emily Holloway went yesterday to [facility] to get CT and labs but things didn't get done correctly so they r/s Emily Holloway to today at 1pm. The Emily Holloway feels the same with little energy and still having bm's thru her vagina nothing is passing thru her rectum. The Emily Holloway is eating some solids but drinking plenty of fluids. The Emily Holloway is not walking much just b/c she doesn't have the energy. It takes a lot out of her to keep cleaning her vaginal area from the passing stool. The Emily Holloway will be here tomorrow for her appt with Dr Michaell Cowing. I did advise Emily Holloway that Dr Michaell Cowing wants me to order a MRI of the pelvis so I am going to work on getting this scheduled so I will give her the details when she comes in for the appt.

## 2012-03-24 ENCOUNTER — Encounter (INDEPENDENT_AMBULATORY_CARE_PROVIDER_SITE_OTHER): Payer: Self-pay | Admitting: Surgery

## 2012-03-24 ENCOUNTER — Ambulatory Visit (INDEPENDENT_AMBULATORY_CARE_PROVIDER_SITE_OTHER): Payer: 59 | Admitting: Surgery

## 2012-03-24 ENCOUNTER — Encounter (HOSPITAL_COMMUNITY): Payer: Self-pay

## 2012-03-24 ENCOUNTER — Inpatient Hospital Stay (HOSPITAL_COMMUNITY)
Admission: AD | Admit: 2012-03-24 | Discharge: 2012-04-03 | DRG: 908 | Disposition: A | Discharge status: Home / Self Care | Payer: 59 | Source: Ambulatory Visit | Attending: Surgery | Admitting: Surgery

## 2012-03-24 VITALS — BP 100/62 | HR 74 | Temp 97.4°F | Resp 16 | Ht 62.0 in | Wt 145.0 lb

## 2012-03-24 DIAGNOSIS — Z79899 Other long term (current) drug therapy: Secondary | ICD-10-CM

## 2012-03-24 DIAGNOSIS — Z932 Ileostomy status: Secondary | ICD-10-CM

## 2012-03-24 DIAGNOSIS — R112 Nausea with vomiting, unspecified: Secondary | ICD-10-CM | POA: Diagnosis present

## 2012-03-24 DIAGNOSIS — Z5331 Laparoscopic surgical procedure converted to open procedure: Secondary | ICD-10-CM

## 2012-03-24 DIAGNOSIS — Z87891 Personal history of nicotine dependence: Secondary | ICD-10-CM

## 2012-03-24 DIAGNOSIS — N823 Fistula of vagina to large intestine: Secondary | ICD-10-CM

## 2012-03-24 DIAGNOSIS — K435 Parastomal hernia without obstruction or  gangrene: Secondary | ICD-10-CM | POA: Diagnosis present

## 2012-03-24 DIAGNOSIS — Y838 Other surgical procedures as the cause of abnormal reaction of the patient, or of later complication, without mention of misadventure at the time of the procedure: Secondary | ICD-10-CM | POA: Diagnosis present

## 2012-03-24 DIAGNOSIS — K929 Disease of digestive system, unspecified: Secondary | ICD-10-CM | POA: Diagnosis present

## 2012-03-24 DIAGNOSIS — K589 Irritable bowel syndrome without diarrhea: Secondary | ICD-10-CM | POA: Diagnosis present

## 2012-03-24 DIAGNOSIS — Z432 Encounter for attention to ileostomy: Secondary | ICD-10-CM

## 2012-03-24 DIAGNOSIS — D638 Anemia in other chronic diseases classified elsewhere: Secondary | ICD-10-CM | POA: Diagnosis present

## 2012-03-24 DIAGNOSIS — T8183XA Persistent postprocedural fistula, initial encounter: Principal | ICD-10-CM | POA: Diagnosis present

## 2012-03-24 DIAGNOSIS — N824 Other female intestinal-genital tract fistulae: Secondary | ICD-10-CM

## 2012-03-24 DIAGNOSIS — Z85048 Personal history of other malignant neoplasm of rectum, rectosigmoid junction, and anus: Secondary | ICD-10-CM

## 2012-03-24 DIAGNOSIS — D759 Disease of blood and blood-forming organs, unspecified: Secondary | ICD-10-CM | POA: Diagnosis present

## 2012-03-24 DIAGNOSIS — K469 Unspecified abdominal hernia without obstruction or gangrene: Secondary | ICD-10-CM

## 2012-03-24 LAB — COMPREHENSIVE METABOLIC PANEL
Alkaline Phosphatase: 67 U/L (ref 39–117)
BUN: 6 mg/dL (ref 6–23)
GFR calc Af Amer: >90 mL/min (ref >90)
GFR calc non Af Amer: >90 mL/min (ref >90)
Glucose, Bld: 96 mg/dL (ref 70–99)
Potassium: 3.4 mEq/L — ABNORMAL LOW (ref 3.5–5.1)
Total Bilirubin: 0.2 mg/dL — ABNORMAL LOW (ref 0.3–1.2)
Total Protein: 6.6 g/dL (ref 6.0–8.3)

## 2012-03-24 LAB — CBC WITH DIFFERENTIAL/PLATELET
Basophils Absolute: 0.1 10*3/uL (ref 0.0–0.1)
Lymphocytes Relative: 13 % (ref 12–46)
Lymphs Abs: 1.4 10*3/uL (ref 0.7–4.0)
MCV: 89.5 fL (ref 78.0–100.0)
Monocytes Relative: 9 % (ref 3–12)
Platelets: 541 10*3/uL — ABNORMAL HIGH (ref 150–400)
RDW: 15.4 % (ref 11.5–15.5)
WBC: 10.5 10*3/uL (ref 4.0–10.5)

## 2012-03-24 LAB — PROTIME-INR: Prothrombin Time: 14 seconds (ref 11.6–15.2)

## 2012-03-24 MED ORDER — ACETAMINOPHEN 325 MG PO TABS
650.0000 mg | ORAL_TABLET | Freq: Four times a day (QID) | ORAL | Status: DC
  Administered 2012-03-25 – 2012-03-26 (×4): 650 mg via ORAL
  Filled 2012-03-24 (×8): qty 2
  Filled 2012-03-24: qty 1
  Filled 2012-03-24 (×7): qty 2

## 2012-03-24 MED ORDER — LACTATED RINGERS IV BOLUS (SEPSIS): 1000.0000 mL | Freq: Three times a day (TID) | INTRAVENOUS | Status: AC | PRN

## 2012-03-24 MED ORDER — PIPERACILLIN-TAZOBACTAM 3.375 G IVPB
3.3750 g | Freq: Three times a day (TID) | INTRAVENOUS | Status: AC
  Administered 2012-03-24 – 2012-03-29 (×15): 3.375 g via INTRAVENOUS
  Filled 2012-03-24 (×17): qty 50

## 2012-03-24 MED ORDER — LOPERAMIDE HCL 2 MG PO CAPS
2.0000 mg | ORAL_CAPSULE | Freq: Every day | ORAL | Status: DC
  Administered 2012-03-25: 2 mg via ORAL
  Filled 2012-03-24 (×3): qty 1

## 2012-03-24 MED ORDER — DIPHENHYDRAMINE HCL 12.5 MG/5ML PO ELIX: 12.5000 mg | ORAL_SOLUTION | Freq: Four times a day (QID) | ORAL | Status: DC | PRN

## 2012-03-24 MED ORDER — DEXTROSE IN LACTATED RINGERS 5 % IV SOLN
INTRAVENOUS | Status: DC
  Administered 2012-03-24 – 2012-03-27 (×7): via INTRAVENOUS

## 2012-03-24 MED ORDER — TRAMADOL HCL 50 MG PO TABS
50.0000 mg | ORAL_TABLET | Freq: Four times a day (QID) | ORAL | Status: DC | PRN
  Administered 2012-03-24 (×2): 50 mg via ORAL
  Filled 2012-03-24 (×2): qty 1

## 2012-03-24 MED ORDER — LACTATED RINGERS IV BOLUS (SEPSIS)
1000.0000 mL | Freq: Once | INTRAVENOUS | Status: AC
  Administered 2012-03-24: 1000 mL via INTRAVENOUS

## 2012-03-24 MED ORDER — DIPHENHYDRAMINE HCL 50 MG/ML IJ SOLN: 12.5000 mg | Freq: Four times a day (QID) | INTRAMUSCULAR | Status: DC | PRN

## 2012-03-24 MED ORDER — FLUCONAZOLE IN SODIUM CHLORIDE 200-0.9 MG/100ML-% IV SOLN
200.0000 mg | Freq: Every day | INTRAVENOUS | Status: AC
  Administered 2012-03-24 – 2012-03-28 (×5): 200 mg via INTRAVENOUS
  Filled 2012-03-24 (×5): qty 100

## 2012-03-24 MED ORDER — HYOSCYAMINE SULFATE ER 0.375 MG PO TB12
0.3750 mg | ORAL_TABLET | Freq: Two times a day (BID) | ORAL | Status: DC | PRN
  Filled 2012-03-24: qty 1

## 2012-03-24 MED ORDER — LORAZEPAM 2 MG/ML IJ SOLN: 0.5000 mg | Freq: Three times a day (TID) | INTRAMUSCULAR | Status: DC | PRN

## 2012-03-24 MED ORDER — METOPROLOL TARTRATE 1 MG/ML IV SOLN
5.0000 mg | Freq: Four times a day (QID) | INTRAVENOUS | Status: DC | PRN
  Administered 2012-03-27: 5 mg via INTRAVENOUS
  Filled 2012-03-24 (×2): qty 5

## 2012-03-24 MED ORDER — PROMETHAZINE HCL 25 MG/ML IJ SOLN: 12.5000 mg | Freq: Four times a day (QID) | INTRAMUSCULAR | Status: DC | PRN

## 2012-03-24 MED ORDER — LIP MEDEX EX OINT
1.0000 "application " | TOPICAL_OINTMENT | Freq: Two times a day (BID) | CUTANEOUS | Status: DC
  Administered 2012-03-24 – 2012-04-03 (×16): 1 via TOPICAL
  Filled 2012-03-24: qty 7

## 2012-03-24 MED ORDER — MAGIC MOUTHWASH
15.0000 mL | Freq: Four times a day (QID) | ORAL | Status: DC | PRN
  Filled 2012-03-24: qty 15

## 2012-03-24 MED ORDER — SACCHAROMYCES BOULARDII 250 MG PO CAPS
250.0000 mg | ORAL_CAPSULE | Freq: Two times a day (BID) | ORAL | Status: DC
  Administered 2012-03-24 – 2012-04-03 (×19): 250 mg via ORAL
  Filled 2012-03-24 (×23): qty 1

## 2012-03-24 MED ORDER — HYDROMORPHONE HCL PF 1 MG/ML IJ SOLN
0.5000 mg | INTRAMUSCULAR | Status: DC | PRN
  Administered 2012-03-26 – 2012-03-31 (×16): 1 mg via INTRAVENOUS
  Filled 2012-03-24 (×17): qty 1

## 2012-03-24 MED ORDER — ALUM & MAG HYDROXIDE-SIMETH 200-200-20 MG/5ML PO SUSP: 30.0000 mL | Freq: Four times a day (QID) | ORAL | Status: DC | PRN

## 2012-03-24 MED ORDER — BISMUTH SUBSALICYLATE 262 MG/15ML PO SUSP
30.0000 mL | Freq: Two times a day (BID) | ORAL | Status: DC
  Administered 2012-03-25 – 2012-03-26 (×2): 30 mL via ORAL
  Filled 2012-03-24 (×2): qty 236

## 2012-03-24 MED ORDER — LORAZEPAM BOLUS VIA INFUSION: 0.5000 mg | Freq: Three times a day (TID) | INTRAVENOUS | Status: DC | PRN

## 2012-03-24 MED ORDER — LOPERAMIDE HCL 2 MG PO CAPS
2.0000 mg | ORAL_CAPSULE | Freq: Three times a day (TID) | ORAL | Status: DC | PRN
  Filled 2012-03-24: qty 2
  Filled 2012-03-24: qty 1

## 2012-03-24 MED ORDER — ONDANSETRON HCL 4 MG/2ML IJ SOLN
4.0000 mg | Freq: Four times a day (QID) | INTRAMUSCULAR | Status: DC | PRN
  Administered 2012-03-30: 4 mg via INTRAVENOUS
  Filled 2012-03-24: qty 2

## 2012-03-24 NOTE — Progress Notes (Signed)
ANTIBIOTIC CONSULT NOTE - INITIAL  Pharmacy Consult for Zosyn Indication: possible abdominal infection  Allergies  Allergen Reactions  . Oxycodone Nausea Only  . Sulfonamide Derivatives     REACTION: itching    Patient Measurements:    Vital Signs: Temp: 98 F (36.7 C) (08/28 1021) Temp src: Oral (08/28 1021) BP: 99/64 mmHg (08/28 1021) Pulse Rate: 101  (08/28 1021) Intake/Output from previous day:   Intake/Output from this shift:    Labs:  Basename 03/23/12 1457  WBC 14.0*  HGB 11.8*  PLT 550*  LABCREA --  CREATININE 0.56   The CrCl is unknown because both a height and weight (above a minimum accepted value) are required for this calculation. No results found for this basename: VANCOTROUGH:2,VANCOPEAK:2,VANCORANDOM:2,GENTTROUGH:2,GENTPEAK:2,GENTRANDOM:2,TOBRATROUGH:2,TOBRAPEAK:2,TOBRARND:2,AMIKACINPEAK:2,AMIKACINTROU:2,AMIKACIN:2, in the last 72 hours   Microbiology: Recent Results (from the past 720 hour(s))  SURGICAL PCR SCREEN     Status: Normal   Collection Time   02/26/12  1:07 PM      Component Value Range Status Comment   MRSA, PCR NEGATIVE  NEGATIVE Final    Staphylococcus aureus NEGATIVE  NEGATIVE Final   URINE CULTURE     Status: Normal   Collection Time   03/16/12 10:59 AM      Component Value Range Status Comment   Colony Count NO GROWTH   Final    Organism ID, Bacteria NO GROWTH   Final     Medical History: Past Medical History  Diagnosis Date  . Rectovaginal fistula     recurrent  . Rectal carcinoma     T2N0 s/p lap LAR with coloanal anastomosis    Medications:  Scheduled:    . acetaminophen  650 mg Oral QID  . bismuth subsalicylate  30 mL Oral BID  . fluconazole (DIFLUCAN) IV  200 mg Intravenous Daily  . lactated ringers  1,000 mL Intravenous Once  . lip balm  1 application Topical BID  . loperamide  2 mg Oral QHS  . saccharomyces boulardii  250 mg Oral BID   Infusions:    . dextrose 5% lactated ringers     Assessment: 56 yo  female per Dr. Gordy Savers assessment struggling now 3 weeks s/p ileostomy takedown and prastomal hernia repair with recurrent rectovaginal fistula with plan of probably colostomy in LLQ with EUA to start Zosyn in meantime per pharmacy dosing for possible abdominal infection  Goal of Therapy:  prevention/eradication of infection  Plan:  Zosyn 3.375g IV q8 (extended interval infusion)   Hessie Knows, PharmD, BCPS Pager [phone] 03/24/2012 10:38 AM

## 2012-03-24 NOTE — Progress Notes (Signed)
Subjective:     Patient ID: Emily Holloway, female   DOB: 1955-12-03, 56 y.o.   MRN: [id number]  HPI   Emily Holloway  [birth year] [id number]  Patient Care Team: Allean Found, MD as PCP - General (Family Medicine) Iva Boop, MD as Consulting Physician (Gastroenterology) Ardeth Sportsman, MD (General Surgery) Martina Sinner, MD as Consulting Physician (Urology)  This patient is a 56 y.o.female who presents today for surgical evaluation.   Procedure: Takedown of ileostomy and parastomal primary hernia  repair 03/05/2012  The patient comes today.   Saw her last week, her appetite improved and her nausea went down.  Then over the weekend she noticed drainage coming out of her vagina.  It is now more constant.  Not defecating out the anus.  Feeling tired.  Mild abdominal discomfort at the hernia site but no diffuse soreness.  No fever nor chills.  A little bit better over the past 2 days.  Not needing pain medications.  Disappointed that the fistula seems to come back.  Did not want to be seen over the weekend.  Comes in today after talking to her again.   Patient Active Problem List  Diagnosis  . Rectovaginal fistula, s/p repair  . History of rectal cancer, T2N0, Nov2010  . Parastomal hernia s/p primary repair Aug2013  . Nausea & vomiting  . IBS (irritable bowel syndrome)    Past Medical History  Diagnosis Date  . Rectovaginal fistula     RESOLVED SINCE LAST RECONSTRUCTIVE SURGERY 10/23/11   . Rectal carcinoma     Past Surgical History  Procedure Date  . Colon resection 2010    Rectal cancer s/p lap LAR/coloanal anastomosis  . Ostomy placement 2011    with RV fistula repair  . Tubal ligation   . Rectovaginal fistula closure [id number], [id number]  . Colonoscopy w/ biopsies and polypectomy 04/09/2009    adenocarcinoma  . Recto-vaginal fissure repair 10/23/2011    Procedure: REPAIR RECTO-VAGINAL FISTULA;  Surgeon: Ardeth Sportsman, MD;  Location: [facility];  Service:  General;  Laterality: N/A;  Repair of Rectovaginal Fistula with Pedicle Martius Flap with Dr Lorin Picket MacDiarmid to co-surgeon   . Ileostomy closure 03/05/2012    Procedure: ILEOSTOMY TAKEDOWN;  Surgeon: Ardeth Sportsman, MD;  Location: [facility];  Service: General;  Laterality: N/A;  . Parastomal hernia repair 03/05/2012    Procedure: HERNIA REPAIR PARASTOMAL;  Surgeon: Ardeth Sportsman, MD;  Location: [facility];  Service: General;  Laterality: N/A;    History   Social History  . Marital Status: Married    Spouse Name: N/A    Number of Children: N/A  . Years of Education: N/A   Occupational History  . Not on file.   Social History Main Topics  . Smoking status: Former Smoker -- 1.0 packs/day for 35 years  . Smokeless tobacco: Never Used   Comment: 2010 QUIT SMOKING  . Alcohol Use: No  . Drug Use: No  . Sexually Active: Not on file   Other Topics Concern  . Not on file   Social History Narrative  . No narrative on file    Family History  Problem Relation Age of Onset  . Hypertension Mother   . Cancer Father     lung  . Diabetes Father   . Cancer Brother     bladder    Current Outpatient Prescriptions  Medication Sig Dispense Refill  . Calcium-Vitamin D-Vitamin K (CALCIUM SOFT CHEWS PO) Take 600 Units  by mouth 2 (two) times daily.       . hyoscyamine (LEVSIN/SL) 0.125 MG SL tablet Place 1 tablet (0.125 mg total) under the tongue every 4 (four) hours as needed for cramping.  30 tablet  0  . Nutritional Supplements (PYCNOGENOL PO) Take 50 mg by mouth every morning.       . traMADol (ULTRAM) 50 MG tablet Take 1-2 tablets (50-100 mg total) by mouth every 6 (six) hours as needed for pain. For pain  50 tablet  0  . promethazine (PHENERGAN) 12.5 MG tablet Take 1-2 tablets (12.5-25 mg total) by mouth every 6 (six) hours as needed for nausea.  20 tablet  3   No current facility-administered medications for this visit.   Facility-Administered Medications Ordered in Other Visits    Medication Dose Route Frequency Provider Last Rate Last Dose  . iohexol (OMNIPAQUE) 300 MG/ML solution 100 mL  100 mL Intravenous Once PRN Medication Radiologist, MD   100 mL at 03/23/12 1432     Allergies  Allergen Reactions  . Oxycodone Nausea Only  . Sulfonamide Derivatives     REACTION: itching    BP 100/62  Pulse 74  Temp 97.4 F (36.3 C) (Temporal)  Resp 16  Ht 5\' 2"  (1.575 m)  Wt 145 lb (65.772 kg)  BMI 26.52 kg/m2  No results found.  Results for orders placed during the hospital encounter of 03/23/12 (from the past 72 hour(s))  CBC     Status: Abnormal   Collection Time   03/23/12  2:57 PM      Component Value Range Comment   WBC 14.0 (*) 4.0 - 10.5 K/uL    RBC 4.05  3.87 - 5.11 MIL/uL    Hemoglobin 11.8 (*) 12.0 - 15.0 g/dL    HCT 40.9  81.1 - 91.4 %    MCV 90.1  78.0 - 100.0 fL    MCH 29.1  26.0 - 34.0 pg    MCHC 32.3  30.0 - 36.0 g/dL    RDW 78.2  95.6 - 21.3 %    Platelets 550 (*) 150 - 400 K/uL   CEA     Status: Normal   Collection Time   03/23/12  2:57 PM      Component Value Range Comment   CEA 1.0  0.0 - 5.0 ng/mL   COMPREHENSIVE METABOLIC PANEL     Status: Abnormal   Collection Time   03/23/12  2:57 PM      Component Value Range Comment   Sodium 134 (*) 135 - 145 mEq/L    Potassium 3.3 (*) 3.5 - 5.1 mEq/L    Chloride 91 (*) 96 - 112 mEq/L    CO2 30  19 - 32 mEq/L    Glucose, Bld 105 (*) 70 - 99 mg/dL    BUN 6  6 - 23 mg/dL    Creatinine, Ser 0.86  0.50 - 1.10 mg/dL    Calcium 9.4  8.4 - 57.8 mg/dL    Total Protein 6.6  6.0 - 8.3 g/dL    Albumin 2.8 (*) 3.5 - 5.2 g/dL    AST 27  0 - 37 U/L    ALT 53 (*) 0 - 35 U/L    Alkaline Phosphatase 71  39 - 117 U/L    Total Bilirubin 0.3  0.3 - 1.2 mg/dL    GFR calc non Af Amer >90  >90 mL/min    GFR calc Af Amer >90  >90 mL/min  URINALYSIS, ROUTINE W REFLEX MICROSCOPIC     Status: Abnormal   Collection Time   03/23/12  2:57 PM      Component Value Range Comment   Color, Urine YELLOW  YELLOW     APPearance CLOUDY (*) CLEAR    Specific Gravity, Urine <1.005 (*) 1.005 - 1.030    pH 5.5  5.0 - 8.0    Glucose, UA NEGATIVE  NEGATIVE mg/dL    Hgb urine dipstick LARGE (*) NEGATIVE    Bilirubin Urine NEGATIVE  NEGATIVE    Ketones, ur NEGATIVE  NEGATIVE mg/dL    Protein, ur NEGATIVE  NEGATIVE mg/dL    Urobilinogen, UA 0.2  0.0 - 1.0 mg/dL    Nitrite NEGATIVE  NEGATIVE    Leukocytes, UA NEGATIVE  NEGATIVE   URINE MICROSCOPIC-ADD ON     Status: Normal   Collection Time   03/23/12  2:57 PM      Component Value Range Comment   Squamous Epithelial / LPF RARE  RARE    RBC / HPF 7-10  <3 RBC/hpf    Urine-Other AMORPHOUS URATES/PHOSPHATES        Review of Systems  Constitutional: Positive for appetite change and fatigue. Negative for fever, chills and diaphoresis.  HENT: Negative for ear pain, sore throat and trouble swallowing.   Eyes: Negative for photophobia and visual disturbance.  Respiratory: Negative for cough and choking.   Cardiovascular: Negative for chest pain and palpitations.  Gastrointestinal: Positive for diarrhea. Negative for nausea, vomiting, constipation, blood in stool, abdominal distention, anal bleeding and rectal pain.  Genitourinary: Positive for vaginal discharge and vaginal pain. Negative for dysuria, frequency, vaginal bleeding and difficulty urinating.  Musculoskeletal: Negative for myalgias, back pain, arthralgias and gait problem.  Skin: Negative for color change, pallor and rash.  Neurological: Negative for dizziness, speech difficulty, weakness and numbness.  Hematological: Negative for adenopathy.  Psychiatric/Behavioral: Negative for confusion and agitation. The patient is not nervous/anxious.        Objective:   Physical Exam  Constitutional: She is oriented to person, place, and time. She appears well-developed and well-nourished. She is cooperative.  Non-toxic appearance. She has a sickly appearance. No distress.  HENT:  Head: Normocephalic.    Mouth/Throat: Oropharynx is clear and moist. No oropharyngeal exudate.  Eyes: Conjunctivae and EOM are normal. Pupils are equal, round, and reactive to light. No scleral icterus.  Neck: Normal range of motion. No tracheal deviation present.  Cardiovascular: Normal rate and intact distal pulses.   Pulmonary/Chest: Effort normal. No respiratory distress. She exhibits no tenderness.  Abdominal: Soft. Bowel sounds are normal. She exhibits no distension and no mass. There is no tenderness. There is no rebound and no guarding. Hernia confirmed negative in the right inguinal area and confirmed negative in the left inguinal area.         Incisions clean with normal healing ridges.  No hernias  Genitourinary:    No vaginal discharge found.  Musculoskeletal: Normal range of motion. She exhibits no tenderness.  Lymphadenopathy:       Right: No inguinal adenopathy present.       Left: No inguinal adenopathy present.  Neurological: She is alert and oriented to person, place, and time. No cranial nerve deficit. She exhibits normal muscle tone. Coordination normal.  Skin: Skin is warm and dry. No rash noted. She is not diaphoretic.  Psychiatric: She has a normal mood and affect. Her behavior is normal.       Assessment:  Struggling now 3 weeks status post ileostomy takedown and parastomal hernia repair with recurrent rectovaginal fistula    Plan:     Admit IV ABX Fecal diversion - probable colostomy in LLQ with EUA.  Prob permanent at this point, but will see...  The anatomy & physiology of the digestive tract was discussed.  The pathophysiology was discussed.  Natural history risks without surgery was discussed.   I feel the risks of no intervention will lead to serious problems that outweigh the operative risks; therefore, I recommended a colostomy to divert stool away from the recurrent RV fistula & EUA.  Laparoscopic & open techniques were discussed.   Risks such as bleeding, infection,  abscess, leak, reoperation, possible ostomy, hernia, heart attack, death, and other risks were discussed.  I noted a good likelihood this will help address the problem.   Goals of post-operative recovery were discussed as well.  We will work to minimize complications.  An educational handout on the pathology was given as well.  Questions were answered.  The patient expresses understanding & wishes to proceed with surgery.  Nausea/pain control Prob MRI later to detail anatomy

## 2012-03-24 NOTE — H&P (Signed)
Subjective:     Patient ID: Emily Holloway, female   DOB: 03/27/1956, 56 y.o.   MRN: [id number]  HPI   Emily Holloway  07/20/1956 [id number]  Patient Care Team: Candace Thiele Smith, MD as PCP - General (Family Medicine) Carl E Gessner, MD as Consulting Physician (Gastroenterology) Kenetra Hildenbrand C. Ammy Lienhard, MD (General Surgery) Scott A MacDiarmid, MD as Consulting Physician (Urology)  This patient is a 56 y.o.female who presents today for surgical evaluation.   Procedure: Takedown of ileostomy and parastomal primary hernia  repair 03/05/2012  The patient comes today.   Saw her last week, her appetite improved and her nausea went down.  Then over the weekend she noticed drainage coming out of her vagina.  It is now more constant.  Not defecating out the anus.  Feeling tired.  Mild abdominal discomfort at the hernia site but no diffuse soreness.  No fever nor chills.  A little bit better over the past 2 days.  Not needing pain medications.  Disappointed that the fistula seems to come back.  Did not want to be seen over the weekend.  Comes in today after talking to her again.   Patient Active Problem List  Diagnosis  . Rectovaginal fistula, s/p repair  . History of rectal cancer, T2N0, Nov2010  . Parastomal hernia s/p primary repair Aug2013  . Nausea & vomiting  . IBS (irritable bowel syndrome)    Past Medical History  Diagnosis Date  . Rectovaginal fistula     RESOLVED SINCE LAST RECONSTRUCTIVE SURGERY 10/23/11   . Rectal carcinoma     Past Surgical History  Procedure Date  . Colon resection 2010    Rectal cancer s/p lap LAR/coloanal anastomosis  . Ostomy placement 2011    with RV fistula repair  . Tubal ligation   . Rectovaginal fistula closure dec2011, may2012  . Colonoscopy w/ biopsies and polypectomy 04/09/2009    adenocarcinoma  . Recto-vaginal fissure repair 10/23/2011    Procedure: REPAIR RECTO-VAGINAL FISTULA;  Surgeon: Trayon Krantz C. Nicolaus Andel, MD;  Location: [facility];  Service:  General;  Laterality: N/A;  Repair of Rectovaginal Fistula with Pedicle Martius Flap with Dr Scott MacDiarmid to co-surgeon   . Ileostomy closure 03/05/2012    Procedure: ILEOSTOMY TAKEDOWN;  Surgeon: Hazim Treadway C. Graelyn Bihl, MD;  Location: [facility];  Service: General;  Laterality: N/A;  . Parastomal hernia repair 03/05/2012    Procedure: HERNIA REPAIR PARASTOMAL;  Surgeon: Adryen Cookson C. Ranada Vigorito, MD;  Location: [facility];  Service: General;  Laterality: N/A;    History   Social History  . Marital Status: Married    Spouse Name: N/A    Number of Children: N/A  . Years of Education: N/A   Occupational History  . Not on file.   Social History Main Topics  . Smoking status: Former Smoker -- 1.0 packs/day for 35 years  . Smokeless tobacco: Never Used   Comment: 2010 QUIT SMOKING  . Alcohol Use: No  . Drug Use: No  . Sexually Active: Not on file   Other Topics Concern  . Not on file   Social History Narrative  . No narrative on file    Family History  Problem Relation Age of Onset  . Hypertension Mother   . Cancer Father     lung  . Diabetes Father   . Cancer Brother     bladder    Current Outpatient Prescriptions  Medication Sig Dispense Refill  . Calcium-Vitamin D-Vitamin K (CALCIUM SOFT CHEWS PO) Take 600 Units   by mouth 2 (two) times daily.       . hyoscyamine (LEVSIN/SL) 0.125 MG SL tablet Place 1 tablet (0.125 mg total) under the tongue every 4 (four) hours as needed for cramping.  30 tablet  0  . Nutritional Supplements (PYCNOGENOL PO) Take 50 mg by mouth every morning.       . traMADol (ULTRAM) 50 MG tablet Take 1-2 tablets (50-100 mg total) by mouth every 6 (six) hours as needed for pain. For pain  50 tablet  0  . promethazine (PHENERGAN) 12.5 MG tablet Take 1-2 tablets (12.5-25 mg total) by mouth every 6 (six) hours as needed for nausea.  20 tablet  3   No current facility-administered medications for this visit.   Facility-Administered Medications Ordered in Other Visits    Medication Dose Route Frequency Provider Last Rate Last Dose  . iohexol (OMNIPAQUE) 300 MG/ML solution 100 mL  100 mL Intravenous Once PRN Medication Radiologist, MD   100 mL at 03/23/12 1432     Allergies  Allergen Reactions  . Oxycodone Nausea Only  . Sulfonamide Derivatives     REACTION: itching    BP 100/62  Pulse 74  Temp 97.4 F (36.3 C) (Temporal)  Resp 16  Ht 5' 2" (1.575 m)  Wt 145 lb (65.772 kg)  BMI 26.52 kg/m2  No results found.  Results for orders placed during the hospital encounter of 03/23/12 (from the past 72 hour(s))  CBC     Status: Abnormal   Collection Time   03/23/12  2:57 PM      Component Value Range Comment   WBC 14.0 (*) 4.0 - 10.5 K/uL    RBC 4.05  3.87 - 5.11 MIL/uL    Hemoglobin 11.8 (*) 12.0 - 15.0 g/dL    HCT 36.5  36.0 - 46.0 %    MCV 90.1  78.0 - 100.0 fL    MCH 29.1  26.0 - 34.0 pg    MCHC 32.3  30.0 - 36.0 g/dL    RDW 15.5  11.5 - 15.5 %    Platelets 550 (*) 150 - 400 K/uL   CEA     Status: Normal   Collection Time   03/23/12  2:57 PM      Component Value Range Comment   CEA 1.0  0.0 - 5.0 ng/mL   COMPREHENSIVE METABOLIC PANEL     Status: Abnormal   Collection Time   03/23/12  2:57 PM      Component Value Range Comment   Sodium 134 (*) 135 - 145 mEq/L    Potassium 3.3 (*) 3.5 - 5.1 mEq/L    Chloride 91 (*) 96 - 112 mEq/L    CO2 30  19 - 32 mEq/L    Glucose, Bld 105 (*) 70 - 99 mg/dL    BUN 6  6 - 23 mg/dL    Creatinine, Ser 0.56  0.50 - 1.10 mg/dL    Calcium 9.4  8.4 - 10.5 mg/dL    Total Protein 6.6  6.0 - 8.3 g/dL    Albumin 2.8 (*) 3.5 - 5.2 g/dL    AST 27  0 - 37 U/L    ALT 53 (*) 0 - 35 U/L    Alkaline Phosphatase 71  39 - 117 U/L    Total Bilirubin 0.3  0.3 - 1.2 mg/dL    GFR calc non Af Amer >90  >90 mL/min    GFR calc Af Amer >90  >90 mL/min     URINALYSIS, ROUTINE W REFLEX MICROSCOPIC     Status: Abnormal   Collection Time   03/23/12  2:57 PM      Component Value Range Comment   Color, Urine YELLOW  YELLOW     APPearance CLOUDY (*) CLEAR    Specific Gravity, Urine <1.005 (*) 1.005 - 1.030    pH 5.5  5.0 - 8.0    Glucose, UA NEGATIVE  NEGATIVE mg/dL    Hgb urine dipstick LARGE (*) NEGATIVE    Bilirubin Urine NEGATIVE  NEGATIVE    Ketones, ur NEGATIVE  NEGATIVE mg/dL    Protein, ur NEGATIVE  NEGATIVE mg/dL    Urobilinogen, UA 0.2  0.0 - 1.0 mg/dL    Nitrite NEGATIVE  NEGATIVE    Leukocytes, UA NEGATIVE  NEGATIVE   URINE MICROSCOPIC-ADD ON     Status: Normal   Collection Time   03/23/12  2:57 PM      Component Value Range Comment   Squamous Epithelial / LPF RARE  RARE    RBC / HPF 7-10  <3 RBC/hpf    Urine-Other AMORPHOUS URATES/PHOSPHATES        Review of Systems  Constitutional: Positive for appetite change and fatigue. Negative for fever, chills and diaphoresis.  HENT: Negative for ear pain, sore throat and trouble swallowing.   Eyes: Negative for photophobia and visual disturbance.  Respiratory: Negative for cough and choking.   Cardiovascular: Negative for chest pain and palpitations.  Gastrointestinal: Positive for diarrhea. Negative for nausea, vomiting, constipation, blood in stool, abdominal distention, anal bleeding and rectal pain.  Genitourinary: Positive for vaginal discharge and vaginal pain. Negative for dysuria, frequency, vaginal bleeding and difficulty urinating.  Musculoskeletal: Negative for myalgias, back pain, arthralgias and gait problem.  Skin: Negative for color change, pallor and rash.  Neurological: Negative for dizziness, speech difficulty, weakness and numbness.  Hematological: Negative for adenopathy.  Psychiatric/Behavioral: Negative for confusion and agitation. The patient is not nervous/anxious.        Objective:   Physical Exam  Constitutional: She is oriented to person, place, and time. She appears well-developed and well-nourished. She is cooperative.  Non-toxic appearance. She has a sickly appearance. No distress.  HENT:  Head: Normocephalic.    Mouth/Throat: Oropharynx is clear and moist. No oropharyngeal exudate.  Eyes: Conjunctivae and EOM are normal. Pupils are equal, round, and reactive to light. No scleral icterus.  Neck: Normal range of motion. No tracheal deviation present.  Cardiovascular: Normal rate and intact distal pulses.   Pulmonary/Chest: Effort normal. No respiratory distress. She exhibits no tenderness.  Abdominal: Soft. Bowel sounds are normal. She exhibits no distension and no mass. There is no tenderness. There is no rebound and no guarding. Hernia confirmed negative in the right inguinal area and confirmed negative in the left inguinal area.         Incisions clean with normal healing ridges.  No hernias  Genitourinary:    No vaginal discharge found.  Musculoskeletal: Normal range of motion. She exhibits no tenderness.  Lymphadenopathy:       Right: No inguinal adenopathy present.       Left: No inguinal adenopathy present.  Neurological: She is alert and oriented to person, place, and time. No cranial nerve deficit. She exhibits normal muscle tone. Coordination normal.  Skin: Skin is warm and dry. No rash noted. She is not diaphoretic.  Psychiatric: She has a normal mood and affect. Her behavior is normal.       Assessment:       Struggling now 3 weeks status post ileostomy takedown and parastomal hernia repair with recurrent rectovaginal fistula    Plan:     Admit IV ABX Fecal diversion - probable colostomy in LLQ with EUA.  Prob permanent at this point, but will see...  The anatomy & physiology of the digestive tract was discussed.  The pathophysiology was discussed.  Natural history risks without surgery was discussed.   I feel the risks of no intervention will lead to serious problems that outweigh the operative risks; therefore, I recommended a colostomy to divert stool away from the recurrent RV fistula & EUA.  Laparoscopic & open techniques were discussed.   Risks such as bleeding, infection,  abscess, leak, reoperation, possible ostomy, hernia, heart attack, death, and other risks were discussed.  I noted a good likelihood this will help address the problem.   Goals of post-operative recovery were discussed as well.  We will work to minimize complications.  An educational handout on the pathology was given as well.  Questions were answered.  The patient expresses understanding & wishes to proceed with surgery.  Nausea/pain control Prob MRI later to detail anatomy          lead to serious problems that outweigh the operative risks; therefore, I recommended a colostomy to divert stool away from the recurrent RV fistula & EUA.  Laparoscopic & open techniques were discussed.    Risks such as bleeding, infection, abscess, leak, reoperation, possible ostomy, hernia, heart attack, death, and other risks were discussed.  I noted a good likelihood this will help address the problem.   Goals of post-operative recovery were discussed as well.  We will work to minimize complications.  An educational handout on the pathology was given as well.  Questions were answered.  The patient expresses understanding & wishes to proceed with surgery.   Nausea/pain control Prob MRI later to detail anatomy

## 2012-03-24 NOTE — Consult Note (Signed)
WOC ostomy consult  Pt with previous ileostomy x 2 different times.  She has recurrent rectovaginal fistula now and has surgery planned tom.  Pt very informed on her procedures and able to give me extensive history.  She has old healed incision on the right lower quadrant.  Has recent JP removed LLQ.   I have left educational information on the creation and care of the stoma, even though pt is very knowledgeable.  Answered questions about the difference in an ileostomy and colostomy.   Marked pts abdomen with in the rectus muscle for both ileostomy and colostomy.  Verified that pt can visualize sites lying, sitting and standing and made sure that contours of the abdomen did not change significantly with position changes.   Marked colostomy site at 5.5cm below the umbilicus and 7cm to the left of the umbilicus. Marked the ileostomy site at 1.0cm below the umbilicus (to avoid the old incision) and 6.0cm to the right of the umbilicus.  WOC will follow up after surgery for care and teaching.  Emily Holloway, [place] [phone]

## 2012-03-25 ENCOUNTER — Encounter (HOSPITAL_COMMUNITY)
Admission: AD | Disposition: A | Discharge status: Home / Self Care | Payer: Self-pay | Source: Ambulatory Visit | Attending: Surgery

## 2012-03-25 ENCOUNTER — Inpatient Hospital Stay (HOSPITAL_COMMUNITY): Payer: 59 | Admitting: Anesthesiology

## 2012-03-25 ENCOUNTER — Encounter (HOSPITAL_COMMUNITY): Payer: Self-pay | Admitting: Anesthesiology

## 2012-03-25 DIAGNOSIS — K5289 Other specified noninfective gastroenteritis and colitis: Secondary | ICD-10-CM

## 2012-03-25 DIAGNOSIS — N824 Other female intestinal-genital tract fistulae: Secondary | ICD-10-CM

## 2012-03-25 HISTORY — PX: EXAMINATION UNDER ANESTHESIA: SHX1540

## 2012-03-25 HISTORY — PX: COLOSTOMY TAKEDOWN: SHX5258

## 2012-03-25 LAB — BASIC METABOLIC PANEL
Calcium: 8.5 mg/dL (ref 8.4–10.5)
Chloride: 98 mEq/L (ref 96–112)
Creatinine, Ser: 0.49 mg/dL — ABNORMAL LOW (ref 0.50–1.10)
GFR calc Af Amer: >90 mL/min (ref >90)
GFR calc non Af Amer: >90 mL/min (ref >90)

## 2012-03-25 LAB — CBC
MCH: 28.9 pg (ref 26.0–34.0)
MCHC: 31.7 g/dL (ref 30.0–36.0)
MCV: 91 fL (ref 78.0–100.0)
Platelets: 468 10*3/uL — ABNORMAL HIGH (ref 150–400)
Platelets: 514 10*3/uL — ABNORMAL HIGH (ref 150–400)
RBC: 3.57 MIL/uL — ABNORMAL LOW (ref 3.87–5.11)
RDW: 15.6 % — ABNORMAL HIGH (ref 11.5–15.5)
RDW: 15.6 % — ABNORMAL HIGH (ref 11.5–15.5)
WBC: 10.6 10*3/uL — ABNORMAL HIGH (ref 4.0–10.5)

## 2012-03-25 LAB — TYPE AND SCREEN: ABO/RH(D): A POS

## 2012-03-25 LAB — PREALBUMIN: Prealbumin: 9.6 mg/dL — ABNORMAL LOW (ref 17.0–34.0)

## 2012-03-25 SURGERY — EXAM UNDER ANESTHESIA
Anesthesia: General | Site: Rectum | Wound class: Dirty or Infected

## 2012-03-25 MED ORDER — LACTATED RINGERS IV SOLN
INTRAVENOUS | Status: DC | PRN
  Administered 2012-03-25 (×3): via INTRAVENOUS

## 2012-03-25 MED ORDER — PROPOFOL 10 MG/ML IV EMUL
INTRAVENOUS | Status: DC | PRN
  Administered 2012-03-25: 150 mg via INTRAVENOUS

## 2012-03-25 MED ORDER — PHENYLEPHRINE HCL 10 MG/ML IJ SOLN
INTRAMUSCULAR | Status: DC | PRN
  Administered 2012-03-25: 40 ug via INTRAVENOUS
  Administered 2012-03-25 (×4): 20 ug via INTRAVENOUS
  Administered 2012-03-25: 40 ug via INTRAVENOUS

## 2012-03-25 MED ORDER — FENTANYL CITRATE 0.05 MG/ML IJ SOLN: 25.0000 ug | INTRAMUSCULAR | Status: DC | PRN

## 2012-03-25 MED ORDER — ACETAMINOPHEN 10 MG/ML IV SOLN
INTRAVENOUS | Status: DC | PRN
  Administered 2012-03-25: 1000 mg via INTRAVENOUS

## 2012-03-25 MED ORDER — MIDAZOLAM HCL 5 MG/5ML IJ SOLN
INTRAMUSCULAR | Status: DC | PRN
  Administered 2012-03-25 (×2): 0.5 mg via INTRAVENOUS

## 2012-03-25 MED ORDER — SUCCINYLCHOLINE CHLORIDE 20 MG/ML IJ SOLN
INTRAMUSCULAR | Status: DC | PRN
  Administered 2012-03-25: 100 mg via INTRAVENOUS

## 2012-03-25 MED ORDER — DEXAMETHASONE SODIUM PHOSPHATE 10 MG/ML IJ SOLN
INTRAMUSCULAR | Status: DC | PRN
  Administered 2012-03-25: 10 mg via INTRAVENOUS

## 2012-03-25 MED ORDER — HYDROMORPHONE HCL PF 1 MG/ML IJ SOLN
INTRAMUSCULAR | Status: DC | PRN
  Administered 2012-03-25 (×2): 1 mg via INTRAVENOUS

## 2012-03-25 MED ORDER — NALOXONE HCL 0.4 MG/ML IJ SOLN: 0.4000 mg | INTRAMUSCULAR | Status: DC | PRN

## 2012-03-25 MED ORDER — LIDOCAINE HCL (CARDIAC) 20 MG/ML IV SOLN
INTRAVENOUS | Status: DC | PRN
  Administered 2012-03-25: 75 mg via INTRAVENOUS

## 2012-03-25 MED ORDER — FENTANYL CITRATE 0.05 MG/ML IJ SOLN
INTRAMUSCULAR | Status: DC | PRN
  Administered 2012-03-25: 50 ug via INTRAVENOUS
  Administered 2012-03-25: 25 ug via INTRAVENOUS
  Administered 2012-03-25: 50 ug via INTRAVENOUS
  Administered 2012-03-25: 100 ug via INTRAVENOUS
  Administered 2012-03-25 (×2): 50 ug via INTRAVENOUS
  Administered 2012-03-25: 25 ug via INTRAVENOUS
  Administered 2012-03-25: 50 ug via INTRAVENOUS

## 2012-03-25 MED ORDER — ONDANSETRON HCL 4 MG/2ML IJ SOLN
INTRAMUSCULAR | Status: DC | PRN
  Administered 2012-03-25 (×2): 2 mg via INTRAVENOUS

## 2012-03-25 MED ORDER — LACTATED RINGERS IV SOLN
INTRAVENOUS | Status: DC | PRN
  Administered 2012-03-25: 07:00:00 via INTRAVENOUS
  Administered 2012-03-25: 1000 mL

## 2012-03-25 MED ORDER — ACETAMINOPHEN 10 MG/ML IV SOLN: 1000.0000 mg | Freq: Once | INTRAVENOUS | Status: DC | PRN

## 2012-03-25 MED ORDER — NEOSTIGMINE METHYLSULFATE 1 MG/ML IJ SOLN
INTRAMUSCULAR | Status: DC | PRN
  Administered 2012-03-25: 2 mg via INTRAVENOUS

## 2012-03-25 MED ORDER — CISATRACURIUM BESYLATE (PF) 10 MG/5ML IV SOLN
INTRAVENOUS | Status: DC | PRN
  Administered 2012-03-25: 4 mg via INTRAVENOUS
  Administered 2012-03-25: 6 mg via INTRAVENOUS

## 2012-03-25 MED ORDER — SODIUM CHLORIDE 0.9 % IJ SOLN: 9.0000 mL | INTRAMUSCULAR | Status: DC | PRN

## 2012-03-25 MED ORDER — PROMETHAZINE HCL 25 MG/ML IJ SOLN: 6.2500 mg | INTRAMUSCULAR | Status: DC | PRN

## 2012-03-25 MED ORDER — LACTATED RINGERS IV SOLN
INTRAVENOUS | Status: DC | PRN
  Administered 2012-03-25: 3000 mL via INTRAVENOUS

## 2012-03-25 MED ORDER — HYDROMORPHONE HCL PF 1 MG/ML IJ SOLN
INTRAMUSCULAR | Status: AC
  Filled 2012-03-25: qty 1

## 2012-03-25 MED ORDER — HETASTARCH-ELECTROLYTES 6 % IV SOLN
INTRAVENOUS | Status: DC | PRN
  Administered 2012-03-25: 09:00:00 via INTRAVENOUS

## 2012-03-25 MED ORDER — SODIUM CHLORIDE 0.9 % IV SOLN
INTRAVENOUS | Status: AC
  Administered 2012-03-25: 09:00:00 via INTRAPERITONEAL
  Filled 2012-03-25: qty 6

## 2012-03-25 MED ORDER — HYDROMORPHONE HCL PF 1 MG/ML IJ SOLN
0.2500 mg | INTRAMUSCULAR | Status: DC | PRN
  Administered 2012-03-25: 0.5 mg via INTRAVENOUS

## 2012-03-25 MED ORDER — EPHEDRINE SULFATE 50 MG/ML IJ SOLN
INTRAMUSCULAR | Status: DC | PRN
  Administered 2012-03-25: 10 mg via INTRAVENOUS
  Administered 2012-03-25: 5 mg via INTRAVENOUS

## 2012-03-25 MED ORDER — BUPIVACAINE-EPINEPHRINE 0.5% -1:200000 IJ SOLN
INTRAMUSCULAR | Status: DC | PRN
  Administered 2012-03-25: 50 mL

## 2012-03-25 MED ORDER — GLYCOPYRROLATE 0.2 MG/ML IJ SOLN
INTRAMUSCULAR | Status: DC | PRN
  Administered 2012-03-25: 0.6 mg via INTRAVENOUS

## 2012-03-25 MED ORDER — MEPERIDINE HCL 50 MG/ML IJ SOLN: 6.2500 mg | INTRAMUSCULAR | Status: DC | PRN

## 2012-03-25 MED ORDER — FENTANYL 10 MCG/ML IV SOLN
INTRAVENOUS | Status: DC
  Administered 2012-03-25: 10 ug via INTRAVENOUS
  Administered 2012-03-25: 225 ug via INTRAVENOUS
  Administered 2012-03-25: 15 ug via INTRAVENOUS
  Administered 2012-03-26: 23:00:00 via INTRAVENOUS
  Administered 2012-03-26: 60 ug via INTRAVENOUS
  Administered 2012-03-26: 210 ug via INTRAVENOUS
  Administered 2012-03-26: 105 ug via INTRAVENOUS
  Administered 2012-03-26: 180 ug via INTRAVENOUS
  Administered 2012-03-26: 274.9 ug via INTRAVENOUS
  Administered 2012-03-26: 10:00:00 via INTRAVENOUS
  Administered 2012-03-27 (×2): 135 ug via INTRAVENOUS
  Administered 2012-03-27: 120 ug via INTRAVENOUS
  Filled 2012-03-25 (×4): qty 50

## 2012-03-25 SURGICAL SUPPLY — 97 items
APPLIER CLIP 5 13 M/L LIGAMAX5 (MISCELLANEOUS)
APPLIER CLIP ROT 10 11.4 M/L (STAPLE)
APR CLP MED LRG 11.4X10 (STAPLE)
APR CLP MED LRG 5 ANG JAW (MISCELLANEOUS)
BANDAGE CONFORM 2  STR LF (GAUZE/BANDAGES/DRESSINGS) ×4 IMPLANT
BLADE EXTENDED COATED 6.5IN (ELECTRODE) ×1 IMPLANT
BLADE HEX COATED 2.75 (ELECTRODE) ×3 IMPLANT
BLADE SURG 15 STRL LF DISP TIS (BLADE) ×4 IMPLANT
BLADE SURG 15 STRL SS (BLADE)
BLADE SURG SZ10 CARB STEEL (BLADE) ×3 IMPLANT
CABLE HIGH FREQUENCY MONO STRZ (ELECTRODE) ×3 IMPLANT
CANISTER SUCTION 2500CC (MISCELLANEOUS) ×4 IMPLANT
CANNULA ENDOPATH XCEL 11M (ENDOMECHANICALS) IMPLANT
CATH KIT ON Q 10IN SLV (PAIN MANAGEMENT) IMPLANT
CATH KIT ON Q 7.5IN SLV (PAIN MANAGEMENT) IMPLANT
CELLS DAT CNTRL 66122 CELL SVR (MISCELLANEOUS) IMPLANT
CLIP APPLIE 5 13 M/L LIGAMAX5 (MISCELLANEOUS) IMPLANT
CLIP APPLIE ROT 10 11.4 M/L (STAPLE) IMPLANT
CLOTH BEACON ORANGE TIMEOUT ST (SAFETY) ×3 IMPLANT
COVER MAYO STAND STRL (DRAPES) ×3 IMPLANT
DECANTER SPIKE VIAL GLASS SM (MISCELLANEOUS) ×3 IMPLANT
DISSECTOR BLUNT TIP ENDO 5MM (MISCELLANEOUS) IMPLANT
DRAPE LAPAROSCOPIC ABDOMINAL (DRAPES) ×3 IMPLANT
DRAPE LAPAROTOMY T 102X78X121 (DRAPES) IMPLANT
DRAPE LG THREE QUARTER DISP (DRAPES) ×1 IMPLANT
DRAPE WARM FLUID 44X44 (DRAPE) ×4 IMPLANT
DRESSING TELFA 8X3 (GAUZE/BANDAGES/DRESSINGS) ×1 IMPLANT
DRESSING TELFA ISLAND 4X8 (GAUZE/BANDAGES/DRESSINGS) ×2 IMPLANT
DRSG PAD ABDOMINAL 8X10 ST (GAUZE/BANDAGES/DRESSINGS) IMPLANT
DRSG TEGADERM 2-3/8X2-3/4 SM (GAUZE/BANDAGES/DRESSINGS) ×2 IMPLANT
ELECT REM PT RETURN 9FT ADLT (ELECTROSURGICAL) ×3
ELECTRODE REM PT RTRN 9FT ADLT (ELECTROSURGICAL) ×2 IMPLANT
ENSEAL DEVICE STD TIP 35CM (ENDOMECHANICALS) ×3 IMPLANT
GAUZE SPONGE 4X4 16PLY XRAY LF (GAUZE/BANDAGES/DRESSINGS) ×2 IMPLANT
GLOVE BIOGEL PI IND STRL 7.0 (GLOVE) ×2 IMPLANT
GLOVE BIOGEL PI INDICATOR 7.0 (GLOVE) ×1
GLOVE ECLIPSE 8.0 STRL XLNG CF (GLOVE) ×6 IMPLANT
GLOVE INDICATOR 8.0 STRL GRN (GLOVE) ×6 IMPLANT
GOWN STRL NON-REIN LRG LVL3 (GOWN DISPOSABLE) ×3 IMPLANT
GOWN STRL REIN XL XLG (GOWN DISPOSABLE) ×14 IMPLANT
KIT BASIN OR (CUSTOM PROCEDURE TRAY) ×3 IMPLANT
LEGGING LITHOTOMY PAIR STRL (DRAPES) ×1 IMPLANT
LIGASURE IMPACT 36 18CM CVD LR (INSTRUMENTS) IMPLANT
NDL SAFETY ECLIPSE 18X1.5 (NEEDLE) IMPLANT
NEEDLE HYPO 18GX1.5 SHARP (NEEDLE)
NEEDLE HYPO 22GX1.5 SAFETY (NEEDLE) ×2 IMPLANT
NS IRRIG 1000ML POUR BTL (IV SOLUTION) ×9 IMPLANT
PACK BASIC VI WITH GOWN DISP (CUSTOM PROCEDURE TRAY) ×3 IMPLANT
PAD ABD 7.5X8 STRL (GAUZE/BANDAGES/DRESSINGS) ×1 IMPLANT
PENCIL BUTTON HOLSTER BLD 10FT (ELECTRODE) ×3 IMPLANT
POUCH DRAINABLE 1PC 2 1/4 FLAT (OSTOMY) ×1 IMPLANT
RELOAD PROXIMATE 75MM BLUE (ENDOMECHANICALS) ×3 IMPLANT
RELOAD STAPLE 75 3.8 BLU REG (ENDOMECHANICALS) IMPLANT
RETRACTOR WND ALEXIS 18 MED (MISCELLANEOUS) IMPLANT
RTRCTR WOUND ALEXIS 18CM MED (MISCELLANEOUS)
SCALPEL HARMONIC ACE (MISCELLANEOUS) IMPLANT
SCISSORS LAP 5X35 DISP (ENDOMECHANICALS) ×3 IMPLANT
SET IRRIG TUBING LAPAROSCOPIC (IRRIGATION / IRRIGATOR) ×1 IMPLANT
SLEEVE XCEL OPT CAN 5 100 (ENDOMECHANICALS) IMPLANT
SPONGE GAUZE 4X4 12PLY (GAUZE/BANDAGES/DRESSINGS) ×3 IMPLANT
SPONGE LAP 18X18 X RAY DECT (DISPOSABLE) ×8 IMPLANT
SPONGE SURGIFOAM ABS GEL 12-7 (HEMOSTASIS) IMPLANT
STAPLER PROXIMATE 75MM BLUE (STAPLE) ×1 IMPLANT
STAPLER VISISTAT 35W (STAPLE) ×3 IMPLANT
SUCTION POOLE TIP (SUCTIONS) ×3 IMPLANT
SUT CHROMIC 2 0 SH (SUTURE) IMPLANT
SUT CHROMIC 3 0 SH 27 (SUTURE) IMPLANT
SUT MNCRL AB 4-0 PS2 18 (SUTURE) ×2 IMPLANT
SUT PDS AB 1 CTX 36 (SUTURE) ×1 IMPLANT
SUT PDS AB 1 TP1 96 (SUTURE) ×2 IMPLANT
SUT PROLENE 2 0 BLUE (SUTURE) ×1 IMPLANT
SUT PROLENE 2 0 SH DA (SUTURE) IMPLANT
SUT SILK 2 0 (SUTURE) ×3
SUT SILK 2 0 SH CR/8 (SUTURE) ×4 IMPLANT
SUT SILK 2-0 18XBRD TIE 12 (SUTURE) ×2 IMPLANT
SUT SILK 3 0 (SUTURE) ×3
SUT SILK 3 0 SH CR/8 (SUTURE) ×3 IMPLANT
SUT SILK 3-0 18XBRD TIE 12 (SUTURE) ×2 IMPLANT
SUT VIC AB 2-0 SH 27 (SUTURE)
SUT VIC AB 2-0 SH 27X BRD (SUTURE) IMPLANT
SUT VIC AB 3-0 SH 18 (SUTURE) ×3 IMPLANT
SUT VIC AB 4-0 SH 18 (SUTURE) IMPLANT
SUT VICRYL 2 0 18  UND BR (SUTURE) ×1
SUT VICRYL 2 0 18 UND BR (SUTURE) ×2 IMPLANT
SYS LAPSCP GELPORT 120MM (MISCELLANEOUS)
SYSTEM LAPSCP GELPORT 120MM (MISCELLANEOUS) IMPLANT
TAPE CLOTH SURG 6X10 WHT LF (GAUZE/BANDAGES/DRESSINGS) ×1 IMPLANT
TOWEL OR 17X26 10 PK STRL BLUE (TOWEL DISPOSABLE) ×6 IMPLANT
TRAY FOLEY CATH 14FRSI W/METER (CATHETERS) ×3 IMPLANT
TRAY LAP CHOLE (CUSTOM PROCEDURE TRAY) ×3 IMPLANT
TROCAR BLADELESS OPT 5 100 (ENDOMECHANICALS) IMPLANT
TROCAR BLADELESS OPT 5 75 (ENDOMECHANICALS) ×6 IMPLANT
TROCAR XCEL BLUNT TIP 100MML (ENDOMECHANICALS) IMPLANT
TROCAR XCEL NON-BLD 11X100MML (ENDOMECHANICALS) IMPLANT
TUBING INSUFFLATION 10FT LAP (TUBING) ×3 IMPLANT
YANKAUER SUCT BULB TIP 10FT TU (MISCELLANEOUS) ×3 IMPLANT
YANKAUER SUCT BULB TIP NO VENT (SUCTIONS) ×5 IMPLANT

## 2012-03-25 NOTE — Progress Notes (Signed)
Patient still in surgery at this time.

## 2012-03-25 NOTE — Anesthesia Postprocedure Evaluation (Signed)
Anesthesia Post Note  Patient: Emily Holloway  Procedure(s) Performed: Procedure(s) (LRB): EXAM UNDER ANESTHESIA (N/A) LAPAROSCOPIC COLOSTOMY TAKEDOWN (N/A)  Anesthesia type: General  Patient location: PACU  Post pain: Pain level controlled  Post assessment: Post-op Vital signs reviewed  Last Vitals: BP 115/63  Pulse 105  Temp 36.7 C (Oral)  Resp 12  Ht 5\' 2"  (1.575 m)  Wt 145 lb (65.772 kg)  BMI 26.52 kg/m2  SpO2 100%  Post vital signs: Reviewed  Level of consciousness: sedated  Complications: No apparent anesthesia complications

## 2012-03-25 NOTE — Op Note (Signed)
03/24/2012 - 03/25/2012  10:21 AM  PATIENT:  Emily Holloway  56 y.o. female  Patient Care Team: Allean Found, MD as PCP - General (Family Medicine) Iva Boop, MD as Consulting Physician (Gastroenterology) Ardeth Sportsman, MD (General Surgery) Martina Sinner, MD as Consulting Physician (Urology)  PRE-OPERATIVE DIAGNOSIS:    Recurrent rectovaginal fistula H/o rectal cancer s/p LAR  POST-OPERATIVE DIAGNOSIS:    Recurrent rectovaginal fistula H/o rectal cancer s/p LAR Anastomotic breakdown at ileoileal anastomosis s/p loop ileostomy takedown  PROCEDURE:  Procedure(s): EXAM UNDER ANESTHESIA Rectal biopsy of RV fistula LAPAROSCOPIC converted to open ileocecetomy End ileostomy  SURGEON:  Surgeon(s): Ardeth Sportsman, MD  ASSISTANT: Adolph Pollack, MD   ANESTHESIA:   local and general  EBL:  Total I/O In: 2070 [I.V.:2070] Out: 1075 [Urine:1000; Blood:75]  Delay start of Pharmacological VTE agent (>24hrs) due to surgical blood loss or risk of bleeding:  no  DRAINS: none   SPECIMEN:  Source of Specimen:  ileocecum with former ileoileal anastomosis  DISPOSITION OF SPECIMEN:  PATHOLOGY  COUNTS:  YES  PLAN OF CARE: Admit to inpatient   PATIENT DISPOSITION:  PACU - guarded condition.  INDICATION: Pleasant woman with a stage I rectal cancer status post low anterior resection and ileal diversion.  Developed a delayed rectovaginal fistula on takedown.  Has had repeated repairs.  Biopsies and done numerous times with no evidence of rectal cancer recurrence.  Most recently done with a Martius Labial fat pad pedicled buttress repair with the help of Dr. Sherron Monday.  Followup enema study showed no fistula.  Diversion for six months. Underwent ileostomy takedown three weeks ago.  Initially did well.  Then developed a pain & discomfort and recurrent rectovaginal fistula.  I admitted her.  I recommended diversion.  Technique risks benefits alternatives were discussed.   She agreed to proceed.  OR FINDINGS: She had a thinned out area at the crotch of the ileoileal side-to-side anastomosis suspicious for contained perforation.  Is again a very distal rectovaginal fistula about 1 cm proximal to the the introitus.  Posterior midline.  No hard nodularity.  No Jeana Kersting evidence of rectal cancer recurrence.  Patient has a new and ileostomy in the left lower quadrant  DESCRIPTION:   Informed consent was confirmed.  The patient underwent general anaesthesia without difficulty.  The patient was positioned appropriately.  VTE prevention in place.    Proceeded to examination the perineum under anesthesia.  I could obviously a punctate opening in the posterior midline vaginal wall.  I could express mucus when I did a digital rectal examination.  The anterior rectum seemed thinned out as if the flap repair had pulled back.  I cannot feel a hardness or nodularity.  I did anoscopy and no pouch was viable.  Using anoscopy I used a biopsy forceps and biopsied the the anterior rectal wall near the fistula x2.  A biopsy from the vaginal side x1.  This would make sure there is no evidence of cancer.  Hemostasis was excellent.  The patient's abdomen was clipped, prepped, & draped in a sterile fashion.  Surgical timeout confirmed our plan.  The patient was positioned in reverse Trendelenburg.  Abdominal entry was gained using optical entry technique in the right upper abdomen.  Entry was clean.  I induced carbon dioxide insufflation.  Camera inspection revealed intestinal mucosa c/w trocar injury.  I converted to open.  Periumbilical midline incision.  I did not encounter any frank pus.  I eviscerated small  bowel.  I eviscerated the prior ileostomy takedown anastomosis in the right midabdomen.  Trocar had gone into the apex of it.  There was a white thinned out area that was open suspicious for a contained perforation.  There is no frank peritonitis.  We decided to do a ileocecal  resection since the anastomosis was within 10 cm of the cecum I did not think he could be spared.  I transected using 75 GIA stapler in the proximal ascending colon and in the distal terminal ileum.  I ligated the mesentery using a bipolar in seal system.  Used 2-0 silk stitches to control a few small bleeders in anal ileal mesentery.  Hemostasis was good.  I placed a 2-0 Prolene stitches on the corners of the proximal ascending colon staple line to help market for later.  I took the cecal mesentery and patch did over the staple line of the ascending colon as a patch to help protect the staple line.  I used interrupted 2-0 silk stitches to do that.  Hemostasis is excellent.  We eviscerated the small bowel and found no evidence of serosal injury or other abnormality.  No other adhesions.  I inspected the colon from the ascending colon down to the neo-sigmoid region.  I avoided doing over dissection the very deep pelvis given her numerous surgeries down there.  He comes here irrigation with numerous liters of isotonic fluid.  Did a final irrigation of antibiotic solution (clindamycin 900 mg plus gentamicin 240 mg in 1 L).  The patient did have some hypotension but we have reinspected and showed no evidence of any bleeding.    She had preoperative marking.  We brought up the terminal ileum up the left lower quadrant premarked area.  I did a 2.5 cm skin opening and opened the anterior rectus fascia transversely.  I was able dilated two fingers.  I was able to bring up the terminal ileum stump easily.  The distal centimeter did have some ischemia with it.  We had placed silk stitches on the peritoneal side to the ileum interrupted x2.    We changed gloves.  I reapproximated the fascia using #1 PDS looped.  I closed the final port site with 4 Monocryl.  I closed the midline wound with some interrupted 4 Monocryl stitches, leaving 3cm gaps to pack betadine soaked rolled gauze.  We placed a sterile dressing.  I  matured the ileostomy a Brook fashion.  I also placed Vicryl stitches from fascia to the ileum to help tack the ileum to the abdominal wall.  I then matured the ileostomy using 3-0 Vicryl interrupted stitches.  She had a 5 cm high edematous rosebud.  I could intubate the ileostomy through the fascia well without any kinking or distortion.  She was rather edematous.  She stabilized by the end of the case.  We'll watch and step down unit.  I'm about to locate family.

## 2012-03-25 NOTE — Progress Notes (Deleted)
CRITICAL VALUE ALERT  Critical value received:  Hgb 6.7  Date of notification:  03/25/12  Time of notification:  7:03  Critical value read back: yes  Nurse who received alert:  Estelle June, RN  MD notified (1st page): Dr. August Saucer  Time of first page:  7:05  MD notified (2nd page): Dr. August Saucer  Time of second page: 7:27  Responding MD:  Dr. August Saucer  Time MD responded:  7:32

## 2012-03-25 NOTE — Preoperative (Signed)
Beta Blockers   Reason not to administer Beta Blockers:Not Applicable 

## 2012-03-25 NOTE — Progress Notes (Signed)
Emily Holloway [id number] 08-08-1955   Subjective:  Tired  Pain controlled No events  Objective:  Vital signs:  Filed Vitals:   03/24/12 1355 03/24/12 2040 03/25/12 0150 03/25/12 0555  BP: 113/64 103/63 98/55 102/56  Pulse: 100 104 96 94  Temp: 98.1 F (36.7 C) 98.4 F (36.9 C) 98.2 F (36.8 C) 98.3 F (36.8 C)  TempSrc: Oral Oral Oral Oral  Resp: 18 16 16 16   Height:      Weight:      SpO2: 98% 96% 94% 94%    Last BM Date: 03/24/12  Intake/Output   Yesterday:  08/28 0701 - 08/29 0700 In: 590 [P.O.:590] Out: 304 [Urine:302; Stool:2] This shift:     Bowel function:  Flatus: y  BM: y  Physical Exam:  General: Pt awake/alert/oriented x4 in no acute distress Eyes: PERRL, normal EOM.  Sclera clear.  No icterus Neuro: CN II-XII intact w/o focal sensory/motor deficits. Lymph: No head/neck/groin lymphadenopathy Psych:  No delerium/psychosis/paranoia HENT: Normocephalic, Mucus membranes moist.  No thrush Neck: Supple, No tracheal deviation Chest: No chest wall pain w good excursion CV:  Pulses intact.  Regular rhythm Abdomen: Soft.  Nondistended.  Mildly tender at perineum.  No incarcerated hernias. Ext:  SCDs BLE.  No mjr edema.  No cyanosis Skin: No petechiae / purpurae  Problem List:  Principal Problem:  *Recurrent rectovaginal fistula, s/p repair Active Problems:  History of rectal cancer, T2N0, Nov2010  Parastomal hernia s/p primary repair [id number]  Nausea & vomiting  IBS (irritable bowel syndrome)   Assessment  Emily Holloway  56 y.o. female  Day of Surgery  Procedure(s): EXAM UNDER ANESTHESIA LAPAROSCOPIC COLOSTOMY TAKEDOWN  Recurrent rectovaginal fistula  Plan:  Plan Dx lap with colostomy diversion  -VTE prophylaxis- SCDs, etc -mobilize as tolerated to help recovery  Ardeth Sportsman, M.D., F.A.C.S. Gastrointestinal and Minimally Invasive Surgery [facility] [address] [place], [place]  [id number] [phone] Main / Paging [phone] Voice Mail   03/25/2012  CARE TEAM:  PCP: Allean Found, MD  Outpatient Care Team: Patient Care Team: Allean Found, MD as PCP - General (Family Medicine) Iva Boop, MD as Consulting Physician (Gastroenterology) Ardeth Sportsman, MD (General Surgery) Martina Sinner, MD as Consulting Physician (Urology)  Inpatient Treatment Team: Treatment Team: Attending Provider: Ardeth Sportsman, MD; Consulting Physician: Bishop Limbo, MD   Results:   Labs: Results for orders placed during the hospital encounter of 03/24/12 (from the past 48 hour(s))  CBC WITH DIFFERENTIAL     Status: Abnormal   Collection Time   03/24/12 10:50 AM      Component Value Range Comment   WBC 10.5  4.0 - 10.5 K/uL    RBC 3.80 (*) 3.87 - 5.11 MIL/uL    Hemoglobin 11.1 (*) 12.0 - 15.0 g/dL    HCT 53.6 (*) 64.4 - 46.0 %    MCV 89.5  78.0 - 100.0 fL    MCH 29.2  26.0 - 34.0 pg    MCHC 32.6  30.0 - 36.0 g/dL    RDW 03.4  74.2 - 59.5 %    Platelets 541 (*) 150 - 400 K/uL    Neutrophils Relative 74  43 - 77 %    Lymphocytes Relative 13  12 - 46 %    Monocytes Relative 9  3 - 12 %    Eosinophils Relative 3  0 - 5 %    Basophils Relative 1  0 -  1 %    Neutro Abs 7.8 (*) 1.7 - 7.7 K/uL    Lymphs Abs 1.4  0.7 - 4.0 K/uL    Monocytes Absolute 0.9  0.1 - 1.0 K/uL    Eosinophils Absolute 0.3  0.0 - 0.7 K/uL    Basophils Absolute 0.1  0.0 - 0.1 K/uL    WBC Morphology MILD LEFT SHIFT (1-5% METAS, OCC MYELO, OCC BANDS)     MAGNESIUM     Status: Normal   Collection Time   03/24/12 10:50 AM      Component Value Range Comment   Magnesium 2.1  1.5 - 2.5 mg/dL   PHOSPHORUS     Status: Normal   Collection Time   03/24/12 10:50 AM      Component Value Range Comment   Phosphorus 3.9  2.3 - 4.6 mg/dL   PROTIME-INR     Status: Normal   Collection Time   03/24/12 10:50 AM      Component Value Range Comment   Prothrombin Time 14.0  11.6 - 15.2 seconds     INR 1.06  0.00 - 1.49   TSH     Status: Normal   Collection Time   03/24/12 10:50 AM      Component Value Range Comment   TSH 3.962  0.350 - 4.500 uIU/mL   COMPREHENSIVE METABOLIC PANEL     Status: Abnormal   Collection Time   03/24/12 10:50 AM      Component Value Range Comment   Sodium 134 (*) 135 - 145 mEq/L    Potassium 3.4 (*) 3.5 - 5.1 mEq/L    Chloride 93 (*) 96 - 112 mEq/L    CO2 31  19 - 32 mEq/L    Glucose, Bld 96  70 - 99 mg/dL    BUN 6  6 - 23 mg/dL    Creatinine, Ser 5.36  0.50 - 1.10 mg/dL    Calcium 9.0  8.4 - 64.4 mg/dL    Total Protein 6.6  6.0 - 8.3 g/dL    Albumin 2.7 (*) 3.5 - 5.2 g/dL    AST 17  0 - 37 U/L    ALT 38 (*) 0 - 35 U/L    Alkaline Phosphatase 67  39 - 117 U/L    Total Bilirubin 0.2 (*) 0.3 - 1.2 mg/dL    GFR calc non Af Amer >90  >90 mL/min    GFR calc Af Amer >90  >90 mL/min   PREALBUMIN     Status: Abnormal   Collection Time   03/25/12 12:02 AM      Component Value Range Comment   Prealbumin 9.6 (*) 17.0 - 34.0 mg/dL   BASIC METABOLIC PANEL     Status: Abnormal   Collection Time   03/25/12 12:02 AM      Component Value Range Comment   Sodium 134 (*) 135 - 145 mEq/L    Potassium 3.4 (*) 3.5 - 5.1 mEq/L    Chloride 98  96 - 112 mEq/L    CO2 32  19 - 32 mEq/L    Glucose, Bld 141 (*) 70 - 99 mg/dL    BUN 3 (*) 6 - 23 mg/dL    Creatinine, Ser 0.34 (*) 0.50 - 1.10 mg/dL    Calcium 8.5  8.4 - 74.2 mg/dL    GFR calc non Af Amer >90  >90 mL/min    GFR calc Af Amer >90  >90 mL/min   CBC  Status: Abnormal   Collection Time   03/25/12 12:02 AM      Component Value Range Comment   WBC 10.6 (*) 4.0 - 10.5 K/uL    RBC 3.22 (*) 3.87 - 5.11 MIL/uL    Hemoglobin 9.4 (*) 12.0 - 15.0 g/dL    HCT 16.1 (*) 09.6 - 46.0 %    MCV 90.4  78.0 - 100.0 fL    MCH 29.2  26.0 - 34.0 pg    MCHC 32.3  30.0 - 36.0 g/dL    RDW 04.5 (*) 40.9 - 15.5 %    Platelets 514 (*) 150 - 400 K/uL     Imaging / Studies: Ct Abdomen Pelvis W Contrast  03/23/2012   *RADIOLOGY REPORT*  Clinical Data: Evaluate for vaginal fistula  CT ABDOMEN AND PELVIS WITH CONTRAST  Technique:  Multidetector CT imaging of the abdomen and pelvis was performed following the standard protocol during bolus administration of intravenous contrast.  Contrast: OMNIPAQUE IOHEXOL 300 MG/ML  SOLN  Comparison: 07/11/2012  Findings: The lung bases are clear.  No pericardial or pleural effusion.  There is no focal liver abnormality.  Small amount of pericholecystic fluid is identified.  There is no significant biliary dilatation.  The pancreas is normal.  The spleen is within normal limits.  Both adrenal glands are normal.  The right kidney is normal.  The left kidney is normal.  Small amount of gas is noted within the lumen of the gallbladder. There is high-density enteric contrast material identified within the dome of the vagina.  Findings are concerning for colonic vaginal fistula.  Uterus appears normal.  Right posterior adnexal cyst measures 4.5 cm, image number 67.  Unchanged from previous exam.  Decrease in size of left adnexal cystic structure.  Right lower quadrant ileocolic lymph node measures 1 cm, image 49. This is compared with 0.7 cm previously.  No pelvic or inguinal adenopathy.  The stomach appears normal.  There has been reversal of previous right lower quadrant double-barreled ileostomy. Enteroenteric anastomosis has been performed. At the anastomotic site there is a heterogeneous and centrally low density mass which measures approximately 2.6 x 5.6 cm, image 34. The distal small bowel loops are abnormal in appearance with evidence of mucosal enhancement and mural edema.  Wall thickening of the distal small bowel measures up to 10 mm, image 42.  Wall thickening and inflammatory changes involving the entire colon from the cecum to the rectum is also noticed.  No evidence for colonic perforation or abscess formation.  There is a drainage catheter which is situated in the right lower  quadrant ventral abdominal wall.  Review of the visualized bony structures is unremarkable.  IMPRESSION:  1.  Examination is positive for rectovaginal fistula.  Enteric contrast material can be seen within the dome of the vagina.  2.  Abnormal wall thickening and inflammatory changes involving the distal small bowel consistent with enteritis. 3. At the enteroenteric anastomosis there is an abnormal and heterogeneous mass. This may represent a phlegmon formation. No discrete drainable abscess is noted.  Underlying tumor would be difficult to exclude and close interval follow-up is recommended. 4.  Pan colitis. 5.  Stable cystic structure within the right posterior pelvis. Interval improvement in the left ovarian cyst.   Original Report Authenticated By: Rosealee Albee, M.D.     Medications / Allergies: per chart  Antibiotics: Anti-infectives     Start     Dose/Rate Route Frequency Ordered Stop   03/24/12 1100  fluconazole (DIFLUCAN) IVPB 200 mg        200 mg 100 mL/hr over 60 Minutes Intravenous Daily 03/24/12 1011     03/24/12 1100  piperacillin-tazobactam (ZOSYN) IVPB 3.375 g       3.375 g 12.5 mL/hr over 240 Minutes Intravenous Every 8 hours 03/24/12 1039

## 2012-03-25 NOTE — Anesthesia Procedure Notes (Signed)
Procedure Name: Intubation Date/Time: 03/25/2012 7:47 AM Performed by: Edison Pace Pre-anesthesia Checklist: Patient identified, Timeout performed, Suction available, Emergency Drugs available and Patient being monitored Patient Re-evaluated:Patient Re-evaluated prior to inductionOxygen Delivery Method: Circle system utilized Preoxygenation: Pre-oxygenation with 100% oxygen Intubation Type: IV induction Ventilation: Mask ventilation without difficulty Laryngoscope Size: Mac and 3 Grade View: Grade II Tube type: Oral Tube size: 7.5 mm Number of attempts: 1 Airway Equipment and Method: Stylet Placement Confirmation: ETT inserted through vocal cords under direct vision,  positive ETCO2 and breath sounds checked- equal and bilateral Secured at: 21 cm Tube secured with: Tape Dental Injury: Teeth and Oropharynx as per pre-operative assessment  Difficulty Due To: Difficulty was anticipated and Difficult Airway- due to anterior larynx Future Recommendations: Recommend- induction with short-acting agent, and alternative techniques readily available

## 2012-03-25 NOTE — Anesthesia Preprocedure Evaluation (Addendum)
Anesthesia Evaluation  Patient identified by MRN, date of birth, ID band Patient awake    Reviewed: Allergy & Precautions, H&P , NPO status , Patient's Chart, lab work & pertinent test results  Airway Mallampati: II TM Distance: >3 FB Neck ROM: Full    Dental No notable dental hx. (+) Dental Advisory Given   Pulmonary neg pulmonary ROS,  breath sounds clear to auscultation  Pulmonary exam normal       Cardiovascular negative cardio ROS  Rhythm:Regular Rate:Normal     Neuro/Psych negative neurological ROS  negative psych ROS   GI/Hepatic negative GI ROS, Neg liver ROS,   Endo/Other  negative endocrine ROS  Renal/GU negative Renal ROS  negative genitourinary   Musculoskeletal negative musculoskeletal ROS (+)   Abdominal   Peds negative pediatric ROS (+)  Hematology  (+) Blood dyscrasia, anemia ,   Anesthesia Other Findings   Reproductive/Obstetrics negative OB ROS                           Anesthesia Physical  Anesthesia Plan  ASA: II  Anesthesia Plan: General   Post-op Pain Management:    Induction: Intravenous  Airway Management Planned: Oral ETT  Additional Equipment:   Intra-op Plan:   Post-operative Plan: Extubation in OR  Informed Consent: I have reviewed the patients History and Physical, chart, labs and discussed the procedure including the risks, benefits and alternatives for the proposed anesthesia with the patient or authorized representative who has indicated his/her understanding and acceptance.   Dental advisory given  Plan Discussed with: CRNA and Surgeon  Anesthesia Plan Comments:         Anesthesia Quick Evaluation

## 2012-03-25 NOTE — Transfer of Care (Signed)
Immediate Anesthesia Transfer of Care Note  Patient: Emily Holloway  Procedure(s) Performed: Procedure(s) (LRB): EXAM UNDER ANESTHESIA (N/A) LAPAROSCOPIC COLOSTOMY TAKEDOWN (N/A)  Patient Location: PACU  Anesthesia Type: General  Level of Consciousness: awake, oriented, patient cooperative, lethargic and responds to stimulation  Airway & Oxygen Therapy: Patient Spontanous Breathing and Patient connected to face mask oxygen  Post-op Assessment: Report given to PACU RN, Post -op Vital signs reviewed and stable and Patient moving all extremities  Post vital signs: Reviewed and stable  Complications: No apparent anesthesia complications

## 2012-03-26 ENCOUNTER — Encounter (HOSPITAL_COMMUNITY): Payer: Self-pay | Admitting: Surgery

## 2012-03-26 ENCOUNTER — Inpatient Hospital Stay (HOSPITAL_COMMUNITY): Admission: RE | Admit: 2012-03-26 | Payer: 59 | Source: Ambulatory Visit

## 2012-03-26 LAB — BASIC METABOLIC PANEL
Calcium: 7.9 mg/dL — ABNORMAL LOW (ref 8.4–10.5)
Creatinine, Ser: 0.52 mg/dL (ref 0.50–1.10)
GFR calc Af Amer: >90 mL/min (ref >90)
GFR calc non Af Amer: >90 mL/min (ref >90)
Sodium: 137 mEq/L (ref 135–145)

## 2012-03-26 LAB — CBC
Platelets: 555 10*3/uL — ABNORMAL HIGH (ref 150–400)
RBC: 3.3 MIL/uL — ABNORMAL LOW (ref 3.87–5.11)
RDW: 15.8 % — ABNORMAL HIGH (ref 11.5–15.5)
WBC: 12.6 10*3/uL — ABNORMAL HIGH (ref 4.0–10.5)

## 2012-03-26 MED ORDER — TRAMADOL HCL 50 MG PO TABS: 50.0000 mg | ORAL_TABLET | Freq: Four times a day (QID) | ORAL | Status: DC | PRN

## 2012-03-26 NOTE — Progress Notes (Signed)
CARE MANAGEMENT NOTE 03/26/2012  Patient:  EDEE, NIFONG   Account Number:  [id number]  Date Initiated:  03/26/2012  Documentation initiated by:  DAVIS,RHONDA  Subjective/Objective Assessment:   pt with history of recurrent rectovaginal fistulas and rectal ca, recent surg for the same with ileostomty , dcd to home for this on [id number] represented with fecal drainage from the vaginas taken to or and repair of anastomosis leakage     Action/Plan:   from home   Anticipated DC Date:  03/29/2012   Anticipated DC Plan:  HOME/SELF CARE  In-house referral  NA      DC Planning Services  NA      [facility] Choice  NA   Choice offered to / List presented to:  NA   DME arranged  NA      DME agency  NA     HH arranged  NA      HH agency  [facility]   Status of service:  In process, will continue to follow Medicare Important Message given?  NA - LOS <3 / Initial given by admissions (If response is "NO", the following Medicare IM given date fields will be blank) Date Medicare IM given:   Date Additional Medicare IM given:    Discharge Disposition:    Per UR Regulation:  Reviewed for med. necessity/level of care/duration of stay  If discussed at Long Length of Stay Meetings, dates discussed:    Comments:  [id number]/Rhonda Earlene Plater, RN, BSN, CCM: CHART REVIEWED AND UPDATED. NO DISCHARGE NEEDS PRESENT AT THIS TIME. CASE MANAGEMENT [phone]

## 2012-03-26 NOTE — Progress Notes (Addendum)
INITIAL ADULT NUTRITION ASSESSMENT Date: 03/26/2012   Time: 12:54 PM Reason for Assessment: Nutrition risk-unintentional weight loss, decreased po intake  ASSESSMENT: Female 56 y.o.  Dx: Rectovaginal fistula, s/p repair and diverting ileostomy 8/29  Hx:  Past Medical History  Diagnosis Date  . Rectovaginal fistula     recurrent  . Rectal carcinoma     T2N0 s/p lap LAR with coloanal anastomosis   Past Surgical History  Procedure Date  . Colon resection 2010    Rectal cancer s/p lap LAR/coloanal anastomosis  . Ostomy placement 2011    with RV fistula repair  . Tubal ligation   . Rectovaginal fistula closure [id number], [id number]  . Colonoscopy w/ biopsies and polypectomy 04/09/2009    adenocarcinoma  . Recto-vaginal fissure repair 10/23/2011    Procedure: REPAIR RECTO-VAGINAL FISTULA;  Surgeon: Ardeth Sportsman, MD;  Location: [facility];  Service: General;  Laterality: N/A;  Repair of Rectovaginal Fistula with Pedicle Martius Flap with Dr Lorin Picket MacDiarmid to co-surgeon   . Ileostomy closure 03/05/2012    Procedure: ILEOSTOMY TAKEDOWN;  Surgeon: Ardeth Sportsman, MD;  Location: [facility];  Service: General;  Laterality: N/A;  . Parastomal hernia repair 03/05/2012    Procedure: HERNIA REPAIR PARASTOMAL;  Surgeon: Ardeth Sportsman, MD;  Location: [facility];  Service: General;  Laterality: N/A;    Related Meds:     . acetaminophen  650 mg Oral QID  . bismuth subsalicylate  30 mL Oral BID  . fentaNYL   Intravenous Q4H  . fluconazole (DIFLUCAN) IV  200 mg Intravenous Daily  . HYDROmorphone      . lip balm  1 application Topical BID  . loperamide  2 mg Oral QHS  . piperacillin-tazobactam (ZOSYN)  IV  3.375 g Intravenous Q8H  . saccharomyces boulardii  250 mg Oral BID    Ht: 5\' 2"  (157.5 cm)  Wt: 161 lb 6 oz (73.2 kg) (re weigh today using real scale)  Ideal Wt: 50.1 kg  % Ideal Wt: 146  Usual Wt: Pt report UBW of 158# at the beginning of August with approximately a 13# weight loss in the past  2 weeks.  Pt does not think that current bed weight is accurate. Wt Readings from Last 10 Encounters:  03/26/12 161 lb 6 oz (73.2 kg)  03/26/12 161 lb 6 oz (73.2 kg)  03/24/12 145 lb (65.772 kg)  03/16/12 145 lb 9.6 oz (66.044 kg)  03/05/12 159 lb (72.122 kg)  03/05/12 159 lb (72.122 kg)  02/26/12 159 lb (72.122 kg)  12/25/11 157 lb 3.2 oz (71.305 kg)  11/24/11 159 lb (72.122 kg)  11/03/11 161 lb 6.4 oz (73.211 kg)     % Usual Wt: 92  Body mass index is 29.52 kg/(m^2).  Labs:  CMP     Component Value Date/Time   NA 137 03/26/2012 0323   K 3.6 03/26/2012 0323   CL 100 03/26/2012 0323   CO2 30 03/26/2012 0323   GLUCOSE 166* 03/26/2012 0323   BUN <3* 03/26/2012 0323   CREATININE 0.52 03/26/2012 0323   CREATININE 0.58 03/16/2012 1058   CALCIUM 7.9* 03/26/2012 0323   PROT 6.6 03/24/2012 1050   ALBUMIN 2.7* 03/24/2012 1050   AST 17 03/24/2012 1050   ALT 38* 03/24/2012 1050   ALKPHOS 67 03/24/2012 1050   BILITOT 0.2* 03/24/2012 1050   GFRNONAA >90 03/26/2012 0323   GFRAA >90 03/26/2012 0323    I/O last 3 completed shifts: In: 6810 [P.O.:240; I.V.:5920; IV Piggyback:650]  Out: 3897 [Urine:3796; Stool:26; Blood:75] Total I/O In: 925 [I.V.:625; IV Piggyback:300] Out: 25 [Stool:25]  Diet Order: NPO  Supplements/Tube Feeding:  none  IVF:     dextrose 5% lactated ringers Last Rate: 125 mL/hr at 03/26/12 0556    Estimated Nutritional Needs:   Kcal: 1700-1900 Protein: 70-80 gm Fluid: >1.7L  Food/Nutrition Related Hx: Pt reports a decreased intake over the past 2 weeks with a 13# weight loss.  Pt was eating a soft, low fiber diet.  Pt meets criteria for acute malnutrition AEB 8% weight loss and <75% intake over the past 2 weeks. Pt drank boost 3 cans per day prior to admit.  NUTRITION DIAGNOSIS: -Inadequate oral intake (NI-2.1).  Status: Ongoing  RELATED TO: inability to eat  AS EVIDENCE BY: npo status  MONITORING/EVALUATION(Goals): Goal:  Diet advancement per MD.  Tolerate  po with improving intake. Monitor:  Diet advancement, weight, labs, i/o, intake  EDUCATION NEEDS: -No education needs identified at this time   INTERVENTION: Diet advancement per MD when medically ready.   Will Add Boost tid when diet advanced to Full liquids  Dietitian [phone]  DOCUMENTATION CODES Per approved criteria  -Severe malnutrition in the context of acute illness or injury    Emily Holloway 03/26/2012, 12:54 PM

## 2012-03-26 NOTE — Progress Notes (Signed)
Emily Holloway [id number] 1956-01-04   Subjective:  Pain controlled w PCA No events  Objective:  Vital signs:  Filed Vitals:   03/26/12 0000 03/26/12 0025 03/26/12 0200 03/26/12 0400  BP:  104/60 96/65 99/62   Pulse: 105 109 103 96  Temp: 98 F (36.7 C)   97.9 F (36.6 C)  TempSrc: Oral     Resp: 17 19 9 11   Height:      Weight:    161 lb 6 oz (73.2 kg)  SpO2: 96% 97% 97% 97%    Last BM Date: 03/24/12  Intake/Output   Yesterday:  08/29 0701 - 08/30 0700 In: 6220 [I.V.:5670; IV Piggyback:550] Out: 7829 [Urine:3795; Stool:25; Blood:75] This shift:  Total I/O In: 1250 [I.V.:1250] Out: 745 [Urine:720; Stool:25]  Bowel function:  Flatus:No  BM: No  Physical Exam:  General: Pt awake/alert/oriented x4 in no acute distress Eyes: PERRL, normal EOM.  Sclera clear.  No icterus Neuro: CN II-XII intact w/o focal sensory/motor deficits. Lymph: No head/neck/groin lymphadenopathy Psych:  No delerium/psychosis/paranoia HENT: Normocephalic, Mucus membranes moist.  No thrush Neck: Supple, No tracheal deviation Chest: No chest wall pain w good excursion CV:  Pulses intact.  Regular rhythm Abdomen: Soft.  Nondistended.  Ileostomy edematous pink.  No gas/succus.  Mildly tender at perineum.  No incarcerated hernias. Ext:  SCDs BLE.  No mjr edema.  No cyanosis Skin: No petechiae / purpurae  Problem List:  Principal Problem:  *Recurrent rectovaginal fistula, s/p repair Active Problems:  History of rectal cancer, T2N0, Nov2010  Parastomal hernia s/p primary repair [id number]  Nausea & vomiting  IBS (irritable bowel syndrome)   Assessment  Emily Holloway  57 y.o. female  1 Day Post-Op  Procedure(s): EXAM UNDER ANESTHESIA with rectal biopsy Ileocecal resection End ileostomy  Recurrent rectovaginal fistula s/p ileostomy diversion   Plan: -sip until ileus better -transfer to surgical floor -VTE prophylaxis- SCDs, etc -mobilize as tolerated to help  recovery -PCA -f/u path on rectum & ileocecum -anemia - stable  Ardeth Sportsman, M.D., F.A.C.S. Gastrointestinal and Minimally Invasive Surgery [facility] [address] [place], [place] [address] [phone] Main / Paging [phone] Voice Mail   03/26/2012  CARE TEAM:  PCP: Allean Found, MD  Outpatient Care Team: Patient Care Team: Candace Darolyn Rua, MD as PCP - General (Family Medicine) Iva Boop, MD as Consulting Physician (Gastroenterology) Ardeth Sportsman, MD (General Surgery) Martina Sinner, MD as Consulting Physician (Urology)  Inpatient Treatment Team: Treatment Team: Attending Provider: Ardeth Sportsman, MD; Consulting Physician: Md Montez Morita, MD; Registered Nurse: Georgiann Mohs, RN   Results:   Labs: Results for orders placed during the hospital encounter of 03/24/12 (from the past 48 hour(s))  CBC WITH DIFFERENTIAL     Status: Abnormal   Collection Time   03/24/12 10:50 AM      Component Value Range Comment   WBC 10.5  4.0 - 10.5 K/uL    RBC 3.80 (*) 3.87 - 5.11 MIL/uL    Hemoglobin 11.1 (*) 12.0 - 15.0 g/dL    HCT 44.0 (*) 34.7 - 46.0 %    MCV 89.5  78.0 - 100.0 fL    MCH 29.2  26.0 - 34.0 pg    MCHC 32.6  30.0 - 36.0 g/dL    RDW 42.5  95.6 - 38.7 %    Platelets 541 (*) 150 - 400 K/uL    Neutrophils Relative 74  43 - 77 %  Lymphocytes Relative 13  12 - 46 %    Monocytes Relative 9  3 - 12 %    Eosinophils Relative 3  0 - 5 %    Basophils Relative 1  0 - 1 %    Neutro Abs 7.8 (*) 1.7 - 7.7 K/uL    Lymphs Abs 1.4  0.7 - 4.0 K/uL    Monocytes Absolute 0.9  0.1 - 1.0 K/uL    Eosinophils Absolute 0.3  0.0 - 0.7 K/uL    Basophils Absolute 0.1  0.0 - 0.1 K/uL    WBC Morphology MILD LEFT SHIFT (1-5% METAS, OCC MYELO, OCC BANDS)     MAGNESIUM     Status: Normal   Collection Time   03/24/12 10:50 AM      Component Value Range Comment   Magnesium 2.1  1.5 - 2.5 mg/dL   PHOSPHORUS     Status: Normal    Collection Time   03/24/12 10:50 AM      Component Value Range Comment   Phosphorus 3.9  2.3 - 4.6 mg/dL   PROTIME-INR     Status: Normal   Collection Time   03/24/12 10:50 AM      Component Value Range Comment   Prothrombin Time 14.0  11.6 - 15.2 seconds    INR 1.06  0.00 - 1.49   TSH     Status: Normal   Collection Time   03/24/12 10:50 AM      Component Value Range Comment   TSH 3.962  0.350 - 4.500 uIU/mL   COMPREHENSIVE METABOLIC PANEL     Status: Abnormal   Collection Time   03/24/12 10:50 AM      Component Value Range Comment   Sodium 134 (*) 135 - 145 mEq/L    Potassium 3.4 (*) 3.5 - 5.1 mEq/L    Chloride 93 (*) 96 - 112 mEq/L    CO2 31  19 - 32 mEq/L    Glucose, Bld 96  70 - 99 mg/dL    BUN 6  6 - 23 mg/dL    Creatinine, Ser 9.60  0.50 - 1.10 mg/dL    Calcium 9.0  8.4 - 45.4 mg/dL    Total Protein 6.6  6.0 - 8.3 g/dL    Albumin 2.7 (*) 3.5 - 5.2 g/dL    AST 17  0 - 37 U/L    ALT 38 (*) 0 - 35 U/L    Alkaline Phosphatase 67  39 - 117 U/L    Total Bilirubin 0.2 (*) 0.3 - 1.2 mg/dL    GFR calc non Af Amer >90  >90 mL/min    GFR calc Af Amer >90  >90 mL/min   PREALBUMIN     Status: Abnormal   Collection Time   03/25/12 12:02 AM      Component Value Range Comment   Prealbumin 9.6 (*) 17.0 - 34.0 mg/dL   BASIC METABOLIC PANEL     Status: Abnormal   Collection Time   03/25/12 12:02 AM      Component Value Range Comment   Sodium 134 (*) 135 - 145 mEq/L    Potassium 3.4 (*) 3.5 - 5.1 mEq/L    Chloride 98  96 - 112 mEq/L    CO2 32  19 - 32 mEq/L    Glucose, Bld 141 (*) 70 - 99 mg/dL    BUN 3 (*) 6 - 23 mg/dL    Creatinine, Ser 0.98 (*) 0.50 - 1.10 mg/dL  Calcium 8.5  8.4 - 10.5 mg/dL    GFR calc non Af Amer >90  >90 mL/min    GFR calc Af Amer >90  >90 mL/min   CBC     Status: Abnormal   Collection Time   03/25/12 12:02 AM      Component Value Range Comment   WBC 10.6 (*) 4.0 - 10.5 K/uL    RBC 3.22 (*) 3.87 - 5.11 MIL/uL    Hemoglobin 9.4 (*) 12.0 - 15.0 g/dL     HCT 16.1 (*) 09.6 - 46.0 %    MCV 90.4  78.0 - 100.0 fL    MCH 29.2  26.0 - 34.0 pg    MCHC 32.3  30.0 - 36.0 g/dL    RDW 04.5 (*) 40.9 - 15.5 %    Platelets 514 (*) 150 - 400 K/uL   TYPE AND SCREEN     Status: Normal   Collection Time   03/25/12  8:00 AM      Component Value Range Comment   ABO/RH(D) A POS      Antibody Screen NEG      Sample Expiration 03/28/2012     CBC     Status: Abnormal   Collection Time   03/25/12 11:15 AM      Component Value Range Comment   WBC 11.5 (*) 4.0 - 10.5 K/uL    RBC 3.57 (*) 3.87 - 5.11 MIL/uL    Hemoglobin 10.3 (*) 12.0 - 15.0 g/dL    HCT 81.1 (*) 91.4 - 46.0 %    MCV 91.0  78.0 - 100.0 fL    MCH 28.9  26.0 - 34.0 pg    MCHC 31.7  30.0 - 36.0 g/dL    RDW 78.2 (*) 95.6 - 15.5 %    Platelets 468 (*) 150 - 400 K/uL   BASIC METABOLIC PANEL     Status: Abnormal   Collection Time   03/26/12  3:23 AM      Component Value Range Comment   Sodium 137  135 - 145 mEq/L    Potassium 3.6  3.5 - 5.1 mEq/L    Chloride 100  96 - 112 mEq/L    CO2 30  19 - 32 mEq/L    Glucose, Bld 166 (*) 70 - 99 mg/dL    BUN <3 (*) 6 - 23 mg/dL REPEATED TO VERIFY   Creatinine, Ser 0.52  0.50 - 1.10 mg/dL    Calcium 7.9 (*) 8.4 - 10.5 mg/dL    GFR calc non Af Amer >90  >90 mL/min    GFR calc Af Amer >90  >90 mL/min   CBC     Status: Abnormal   Collection Time   03/26/12  3:23 AM      Component Value Range Comment   WBC 12.6 (*) 4.0 - 10.5 K/uL    RBC 3.30 (*) 3.87 - 5.11 MIL/uL    Hemoglobin 9.9 (*) 12.0 - 15.0 g/dL    HCT 21.3 (*) 08.6 - 46.0 %    MCV 91.8  78.0 - 100.0 fL    MCH 30.0  26.0 - 34.0 pg    MCHC 32.7  30.0 - 36.0 g/dL    RDW 57.8 (*) 46.9 - 15.5 %    Platelets 555 (*) 150 - 400 K/uL     Imaging / Studies: No results found.  Medications / Allergies: per chart  Antibiotics: Anti-infectives     Start     Dose/Rate Route Frequency  Ordered Stop   03/25/12 0915   clindamycin (CLEOCIN) 900 mg, gentamicin (GARAMYCIN) 240 mg in sodium chloride 0.9  % 1,000 mL for intraperitoneal lavage         Intraperitoneal To Surgery 03/25/12 0909 03/25/12 0925   03/24/12 1100   fluconazole (DIFLUCAN) IVPB 200 mg        200 mg 100 mL/hr over 60 Minutes Intravenous Daily 03/24/12 1011     03/24/12 1100  piperacillin-tazobactam (ZOSYN) IVPB 3.375 g       3.375 g 12.5 mL/hr over 240 Minutes Intravenous Every 8 hours 03/24/12 1039

## 2012-03-26 NOTE — Progress Notes (Signed)
Report given to Kendal Hymen, RN on [facility]

## 2012-03-27 DIAGNOSIS — Z932 Ileostomy status: Secondary | ICD-10-CM

## 2012-03-27 DIAGNOSIS — E46 Unspecified protein-calorie malnutrition: Secondary | ICD-10-CM

## 2012-03-27 MED ORDER — ACETAMINOPHEN 500 MG PO TABS
1000.0000 mg | ORAL_TABLET | Freq: Three times a day (TID) | ORAL | Status: DC
  Administered 2012-03-27 – 2012-03-31 (×14): 1000 mg via ORAL
  Filled 2012-03-27 (×19): qty 2

## 2012-03-27 MED ORDER — DEXTROSE IN LACTATED RINGERS 5 % IV SOLN
INTRAVENOUS | Status: DC
  Administered 2012-03-27: 22:00:00 via INTRAVENOUS
  Administered 2012-03-27: 50 mL/h via INTRAVENOUS
  Administered 2012-03-27 – 2012-03-30 (×3): via INTRAVENOUS

## 2012-03-27 MED ORDER — HYDROMORPHONE 0.3 MG/ML IV SOLN
INTRAVENOUS | Status: DC
  Administered 2012-03-27: 1.5 mg via INTRAVENOUS
  Administered 2012-03-27: 2.1 mg via INTRAVENOUS
  Administered 2012-03-27: 0.9 mg via INTRAVENOUS
  Administered 2012-03-27 – 2012-03-28 (×2): via INTRAVENOUS
  Administered 2012-03-28: 2.1 mg via INTRAVENOUS
  Administered 2012-03-28: 1.5 mg via INTRAVENOUS
  Administered 2012-03-28: 21:00:00 via INTRAVENOUS
  Administered 2012-03-28: 1.2 mg via INTRAVENOUS
  Administered 2012-03-28: 1.8 mg via INTRAVENOUS
  Administered 2012-03-28: 0.9 mg via INTRAVENOUS
  Administered 2012-03-28: 1.66 mg via INTRAVENOUS
  Administered 2012-03-29 (×2): 1.2 mg via INTRAVENOUS
  Filled 2012-03-27 (×3): qty 25

## 2012-03-27 NOTE — Progress Notes (Signed)
Emily Holloway [id number] 09-26-1955   Subjective:  Pain controlled better w Dilaudid Trying to get up No events  Objective:  Vital signs:  Filed Vitals:   03/27/12 0000 03/27/12 0130 03/27/12 0400 03/27/12 0602  BP:  108/73  111/68  Pulse:  109  110  Temp:  98.7 F (37.1 C)  98.5 F (36.9 C)  TempSrc:  Oral  Oral  Resp: 18 18 11 12   Height:      Weight:      SpO2: 96% 96% 95% 96%    Last BM Date: 03/24/12  Intake/Output   Yesterday:  08/30 0701 - 08/31 0700 In: 1488.3 [I.V.:1188.3; IV Piggyback:300] Out: 1375 [Urine:1350; Stool:25] This shift:     Bowel function:  Flatus:No  BM: No  Physical Exam:  General: Pt awake/alert/oriented x4 in no acute distress Eyes: PERRL, normal EOM.  Sclera clear.  No icterus Neuro: CN II-XII intact w/o focal sensory/motor deficits. Lymph: No head/neck/groin lymphadenopathy Psych:  No delerium/psychosis/paranoia HENT: Normocephalic, Mucus membranes moist.  No thrush Neck: Supple, No tracheal deviation Chest: No chest wall pain w good excursion CV:  Pulses intact.  Regular rhythm Abdomen: Soft.  Nondistended.  Ileostomy edematous pink.  No gas/succus.  Mildly tender at perineum.  No incarcerated hernias. Ext:  SCDs BLE.  No mjr edema.  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 female  2 Days Post-Op  Procedure(s): EXAM UNDER ANESTHESIA with rectal biopsy Ileocecal resection End ileostomy  Recurrent rectovaginal fistula s/p ileostomy diversion   Plan: -sip until ileus better -wound care -ileostomy care -VTE prophylaxis- SCDs, etc -mobilize as tolerated to help recovery -change  PCA to Dilaudid -path on rectum & ileocecum benign.  No cancer recurrence -anemia - stable  Ardeth Sportsman, M.D., F.A.C.S. Gastrointestinal and Minimally Invasive Surgery [facility] [address] [place], [place] [address] [phone] Main / Paging [phone] Voice Mail   03/27/2012  CARE TEAM:  PCP: Allean Found, MD  Outpatient Care Team: Patient Care Team: Candace Darolyn Rua, MD as PCP - General (Family Medicine) Iva Boop, MD as Consulting Physician (Gastroenterology) Ardeth Sportsman, MD (General Surgery) Martina Sinner, MD as Consulting Physician (Urology)  Inpatient Treatment Team: Treatment Team: Attending Provider: Ardeth Sportsman, MD; Consulting Physician: Bishop Limbo, MD; Registered Nurse: Georgiann Mohs, RN; Registered Nurse: Magda Paganini, RN; Registered Nurse: Laray Anger, RN; Registered Nurse: Mckinley Jewel, RN; Technician: Burnard Bunting, [place]; Occupational Therapist: Evern Bio, OT; Physical Therapist: Claretta Fraise Medendorp, PT   Results:   Labs: Results for orders placed during the hospital encounter of 03/24/12 (from the past 48 hour(s))  CBC     Status: Abnormal   Collection Time   03/25/12 11:15 AM      Component Value Range Comment   WBC 11.5 (*) 4.0 - 10.5 K/uL    RBC 3.57 (*) 3.87 - 5.11 MIL/uL    Hemoglobin 10.3 (*) 12.0 - 15.0 g/dL    HCT 63.8 (*) 75.6 - 46.0 %    MCV 91.0  78.0 - 100.0 fL    MCH 28.9  26.0 - 34.0 pg  MCHC 31.7  30.0 - 36.0 g/dL    RDW 09.8 (*) 11.9 - 15.5 %    Platelets 468 (*) 150 - 400 K/uL   BASIC METABOLIC PANEL     Status: Abnormal   Collection Time   03/26/12  3:23 AM      Component Value Range Comment   Sodium 137  135 - 145 mEq/L    Potassium 3.6  3.5 - 5.1 mEq/L    Chloride 100  96 - 112 mEq/L    CO2 30  19 - 32 mEq/L    Glucose, Bld 166 (*) 70 - 99 mg/dL    BUN <3 (*) 6 - 23 mg/dL REPEATED TO VERIFY   Creatinine,  Ser 0.52  0.50 - 1.10 mg/dL    Calcium 7.9 (*) 8.4 - 10.5 mg/dL    GFR calc non Af Amer >90  >90 mL/min    GFR calc Af Amer >90  >90 mL/min   CBC     Status: Abnormal   Collection Time   03/26/12  3:23 AM      Component Value Range Comment   WBC 12.6 (*) 4.0 - 10.5 K/uL    RBC 3.30 (*) 3.87 - 5.11 MIL/uL    Hemoglobin 9.9 (*) 12.0 - 15.0 g/dL    HCT 14.7 (*) 82.9 - 46.0 %    MCV 91.8  78.0 - 100.0 fL    MCH 30.0  26.0 - 34.0 pg    MCHC 32.7  30.0 - 36.0 g/dL    RDW 56.2 (*) 13.0 - 15.5 %    Platelets 555 (*) 150 - 400 K/uL     Imaging / Studies: No results found.  Medications / Allergies: per chart  Antibiotics: Anti-infectives     Start     Dose/Rate Route Frequency Ordered Stop   03/25/12 0915   clindamycin (CLEOCIN) 900 mg, gentamicin (GARAMYCIN) 240 mg in sodium chloride 0.9 % 1,000 mL for intraperitoneal lavage         Intraperitoneal To Surgery 03/25/12 0909 03/25/12 0925   03/24/12 1100   fluconazole (DIFLUCAN) IVPB 200 mg        200 mg 100 mL/hr over 60 Minutes Intravenous Daily 03/24/12 1011 03/29/12 0959   03/24/12 1100  piperacillin-tazobactam (ZOSYN) IVPB 3.375 g       3.375 g 12.5 mL/hr over 240 Minutes Intravenous Every 8 hours 03/24/12 1039 03/29/12 1059

## 2012-03-27 NOTE — Progress Notes (Signed)
PT Cancellation Note  Treatment cancelled today due to patient's refusal to participate.  Pt just had dressing changed and would like to wait until the PM if able to try PT.  PT to check back as time allows.    Rollene Rotunda Uzziah Rigg, PT, DPT [phone] 03/27/2012, 12:09 PM

## 2012-03-27 NOTE — Evaluation (Signed)
Physical Therapy Evaluation Patient Details Name: Emily Holloway MRN: [id number] DOB: 03-06-1956 Today's Date: 03/27/2012 Time: 4098-1191 PT Time Calculation (min): 47 min  PT Assessment / Plan / Recommendation Clinical Impression  56 y.o. female admitted to [facility] with feces leaking from a vaginal fistula.  She underwent surgery to divert her colon and she now has an ostomy.  She presents today with generalized deconditioning, leg weakness, decreased endurance.    PT Assessment  Patient needs continued PT services    Follow Up Recommendations  Home health PT;Supervision/Assistance - 24 hour    Barriers to Discharge        Equipment Recommendations  None recommended by PT    Recommendations for Other Services     Frequency Min 3X/week    Precautions / Restrictions Precautions Precaution Comments: Pt needs new pad/underwear before walking into the hallway due to excessive amounts of vaginal discharge   Pertinent Vitals/Pain 5/10 incisional pain, has PCA-encouraged to use, HR increased to 150s with gait.       Mobility  Bed Mobility Bed Mobility: Rolling Left;Left Sidelying to Sit;Sitting - Scoot to [employer] of Bed;Sit to Sidelying Left;Scooting to [facility] Rolling Left: 6: Modified independent (Device/Increase time);With rail Left Sidelying to Sit: 6: Modified independent (Device/Increase time);With rails;HOB elevated Sitting - Scoot to Edge of Bed: 6: Modified independent (Device/Increase time);With rail Sit to Sidelying Left: 6: Modified independent (Device/Increase time);With rail;HOB elevated Scooting to [facility]: 6: Modified independent (Device/Increase time);With rail Details for Bed Mobility Assistance: heavy reliance on rail to get up, reviewed log roll for comfort to get OOB.   Transfers Transfers: Sit to Stand;Stand to Sit Sit to Stand: 5: Supervision;From bed;From chair/3-in-1;With armrests;With upper extremity assist;From elevated surface Stand to Sit: 5: Supervision;With  armrests;To elevated surface;To bed;To chair/3-in-1;With upper extremity assist Details for Transfer Assistance: supervision for safety Ambulation/Gait Ambulation/Gait Assistance: 5: Supervision Ambulation Distance (Feet): 200 Feet Assistive device: Other (Comment) (IV pole) Ambulation/Gait Assistance Details: held onto IV pole with both hands, mildly flexed posture and slow gait speed.   Gait Pattern: Step-through pattern;Shuffle;Trunk flexed Gait velocity: 0.63 ft/sec (<1.8 indicates that she is at risk for recurrent falls)    Exercises     PT Diagnosis: Difficulty walking;Abnormality of gait;Generalized weakness;Acute pain  PT Problem List: Decreased strength;Decreased activity tolerance;Decreased balance;Decreased mobility;Pain PT Treatment Interventions: DME instruction;Gait training;Stair training;Functional mobility training;Therapeutic activities;Therapeutic exercise;Balance training;Neuromuscular re-education;Patient/family education   PT Goals Acute Rehab PT Goals PT Goal Formulation: With patient Time For Goal Achievement: 04/10/12 Potential to Achieve Goals: Good Pt will go Sit to Stand: with modified independence PT Goal: Sit to Stand - Progress: Goal set today Pt will go Stand to Sit: with modified independence PT Goal: Stand to Sit - Progress: Goal set today Pt will Ambulate: >150 feet;with modified independence;with least restrictive assistive device;with gait velocity >(comment) ft/second (gait speed greater than 1.8 ft/sec) PT Goal: Ambulate - Progress: Goal set today Pt will Go Up / Down Stairs: 1-2 stairs;with modified independence;with least restrictive assistive device PT Goal: Up/Down Stairs - Progress: Goal set today  Visit Information  Last PT Received On: 03/27/12 Assistance Needed: +1 Reason Eval/Treat Not Completed: Patient refused    Subjective Data  Subjective: Pt reports she is feeling better this afternoon after resting.  She is ready to work with  PT.   Patient Stated Goal: to keep her strength and to stop her vaginal drainage   Prior Functioning  Home Living Lives With: Spouse Available Help at Discharge: Available 24 hours/day (husband took  some time off of work) Type of Home: House Home Access: Stairs to enter [employer] of Steps: 1 Entrance Stairs-Rails: None (there is a wall to brace to on both sides) Home Layout: One level Bathroom Shower/Tub: Tub/shower unit;Walk-in shower;Door;Curtain Bathroom Toilet: Handicapped height Home Adaptive Equipment: Walker - rolling;Shower chair with back Prior Function Level of Independence: Independent Able to Take Stairs?: Yes Driving: Yes Vocation: Full time employment Comments: worked in the [employer] Communication: No difficulties    Cognition  Overall Cognitive Status: Appears within functional limits for tasks assessed/performed    Extremity/Trunk Assessment Right Lower Extremity Assessment RLE ROM/Strength/Tone: Deficits RLE ROM/Strength/Tone Deficits: at least 3/5 per gross functional assessment.   Left Lower Extremity Assessment LLE ROM/Strength/Tone: Deficits LLE ROM/Strength/Tone Deficits: at least 3/5 per gross functional assessment.    Balance    End of Session PT - End of Session Activity Tolerance: Patient limited by fatigue;Patient limited by pain Patient left: in bed;with call bell/phone within reach;with nursing in room Nurse Communication: Mobility status  GP     Lurena Joiner B. Laylah Riga, PT, DPT [phone]   03/27/2012, 3:09 PM

## 2012-03-27 NOTE — Progress Notes (Signed)
ANTIBIOTIC CONSULT NOTE - INITIAL  Pharmacy Consult for Zosyn Indication: possible abdominal infection  Allergies  Allergen Reactions  . Oxycodone Nausea Only  . Sulfonamide Derivatives     REACTION: itching    Patient Measurements: Height: 5\' 2"  (157.5 cm) Weight: 161 lb 6 oz (73.2 kg) (re weigh today using real scale) IBW/kg (Calculated) : 50.1   Vital Signs: Temp: 98.6 F (37 C) (08/31 1000) Temp src: Oral (08/31 1000) BP: 108/62 mmHg (08/31 1000) Pulse Rate: 108  (08/31 1000) Intake/Output from previous day: 08/30 0701 - 08/31 0700 In: 1488.3 [I.V.:1188.3; IV Piggyback:300] Out: 1375 [Urine:1350; Stool:25] Intake/Output from this shift:    Labs:  Basename 03/26/12 0323 03/25/12 1115 03/25/12 0002  WBC 12.6* 11.5* 10.6*  HGB 9.9* 10.3* 9.4*  PLT 555* 468* 514*  LABCREA -- -- --  CREATININE 0.52 -- 0.49*   Estimated Creatinine Clearance: 75.3 ml/min (by C-G formula based on Cr of 0.52). No results found for this basename: VANCOTROUGH:2,VANCOPEAK:2,VANCORANDOM:2,GENTTROUGH:2,GENTPEAK:2,GENTRANDOM:2,TOBRATROUGH:2,TOBRAPEAK:2,TOBRARND:2,AMIKACINPEAK:2,AMIKACINTROU:2,AMIKACIN:2, in the last 72 hours   Microbiology: Recent Results (from the past 720 hour(s))  SURGICAL PCR SCREEN     Status: Normal   Collection Time   02/26/12  1:07 PM      Component Value Range Status Comment   MRSA, PCR NEGATIVE  NEGATIVE Final    Staphylococcus aureus NEGATIVE  NEGATIVE Final   URINE CULTURE     Status: Normal   Collection Time   03/16/12 10:59 AM      Component Value Range Status Comment   Colony Count NO GROWTH   Final    Organism ID, Bacteria NO GROWTH   Final     Medical History: Past Medical History  Diagnosis Date  . Rectovaginal fistula     recurrent  . Rectal carcinoma     T2N0 s/p lap LAR with coloanal anastomosis    Medications:  Scheduled:     . acetaminophen  1,000 mg Oral TID  . fluconazole (DIFLUCAN) IV  200 mg Intravenous Daily  . HYDROmorphone PCA  0.3 mg/mL   Intravenous Q4H  . lip balm  1 application Topical BID  . piperacillin-tazobactam (ZOSYN)  IV  3.375 g Intravenous Q8H  . saccharomyces boulardii  250 mg Oral BID  . DISCONTD: acetaminophen  650 mg Oral QID  . DISCONTD: bismuth subsalicylate  30 mL Oral BID  . DISCONTD: fentaNYL   Intravenous Q4H  . DISCONTD: loperamide  2 mg Oral QHS   Infusions:     . dextrose 5% lactated ringers 50 mL/hr (03/27/12 1001)  . DISCONTD: dextrose 5% lactated ringers 75 mL/hr at 03/27/12 0430   Assessment: 56 yo female s/p ileostomy takedown and prastomal hernia repair with recurrent rectovaginal fistula with plan of probably colostomy in LLQ with EUA. On Day#4 Zosyn and Fluconazole for possible abdominal infection. Both scheduled to end 9/2 am per MD order. SCr remains stable.  Goal of Therapy:  prevention/eradication of infection  Plan:  Continue Zosyn 3.375g IV q8 (extended interval infusion)  Darrol Angel, PharmD Pager: [phone] 03/27/2012 11:42 AM

## 2012-03-28 DIAGNOSIS — E876 Hypokalemia: Secondary | ICD-10-CM

## 2012-03-28 NOTE — Evaluation (Signed)
Occupational Therapy Evaluation Patient Details Name: Emily Holloway MRN: [id number] DOB: November 05, 1955 Today's Date: 03/28/2012 Time: 1191-4782 OT Time Calculation (min): 23 min  OT Assessment / Plan / Recommendation Clinical Impression  56 y.o. female admitted to [facility] with feces leaking from a vaginal fistula.  She underwent surgery to divert her colon and she now has an ostomy.  She presents today with decreased I with ADL activity and  decreased endurance    OT Assessment  Patient needs continued OT Services    Follow Up Recommendations  Home health OT       Equipment Recommendations  None recommended by OT       Frequency  Min 2X/week    Precautions / Restrictions Restrictions Weight Bearing Restrictions: No       ADL  Grooming: Performed;Wash/dry hands;Min guard Where Assessed - Grooming: Unsupported standing Upper Body Bathing: Simulated;Minimal assistance Where Assessed - Upper Body Bathing: Unsupported sitting Lower Body Bathing: Simulated;Minimal assistance Where Assessed - Lower Body Bathing: Unsupported sit to stand Upper Body Dressing: Simulated Where Assessed - Upper Body Dressing: Unsupported sitting Lower Body Dressing: Moderate assistance;Performed Where Assessed - Lower Body Dressing: Unsupported sit to stand Toilet Transfer: Performed;Min guard Toilet Transfer Method: Sit to stand;Other (comment) (walk to bathroom) Toilet Transfer Equipment: Regular height toilet;Grab bars Toileting - Clothing Manipulation and Hygiene: Performed;Minimal assistance Where Assessed - Toileting Clothing Manipulation and Hygiene: Standing ADL Comments: Pts heart rate 133 after returning from bathroom. RN aware    OT Diagnosis: Generalized weakness  OT Problem List: Decreased strength;Decreased activity tolerance;Cardiopulmonary status limiting activity OT Treatment Interventions: Self-care/ADL training;DME and/or AE instruction;Patient/family education   OT Goals Acute  Rehab OT Goals OT Goal Formulation: With patient ADL Goals Pt Will Perform Grooming: with modified independence;Standing at sink ADL Goal: Grooming - Progress: Goal set today Pt Will Perform Lower Body Dressing: with modified independence;Sit to stand from chair ADL Goal: Lower Body Dressing - Progress: Goal set today Pt Will Transfer to Toilet: with modified independence;Comfort height toilet;Ambulation ADL Goal: Toilet Transfer - Progress: Goal set today Pt Will Perform Toileting - Clothing Manipulation: with modified independence;Standing ADL Goal: Toileting - Clothing Manipulation - Progress: Goal set today Pt Will Perform Toileting - Hygiene: with modified independence;Standing at 3-in-1/toilet ADL Goal: Toileting - Hygiene - Progress: Goal set today  Visit Information  Last OT Received On: 03/28/12    Subjective Data  Subjective: I have never seen my heart rate in the 130's ( RN  notified)   Prior Functioning  Vision/Perception  Home Living Lives With: Spouse Available Help at Discharge: Available 24 hours/day (husband took some time off of work) Type of Home: House Home Access: Stairs to enter Secretary/administrator of Steps: 1 Entrance Stairs-Rails: None (there is a wall to brace to on both sides) Home Layout: One level Bathroom Shower/Tub: Tub/shower unit;Walk-in shower;Door;Curtain Bathroom Toilet: Handicapped height Home Adaptive Equipment: Walker - rolling;Shower chair with back Prior Function Level of Independence: Independent Able to Take Stairs?: Yes Driving: Yes Vocation: Full time employment Communication Communication: No difficulties      Cognition  Overall Cognitive Status: Appears within functional limits for tasks assessed/performed Arousal/Alertness: Awake/alert Orientation Level: Appears intact for tasks assessed Behavior During Session: [facility] for tasks performed    Extremity/Trunk Assessment Right Upper Extremity Assessment RUE  ROM/Strength/Tone: [facility] for tasks assessed Left Upper Extremity Assessment LUE ROM/Strength/Tone: [facility] for tasks assessed   Mobility  Shoulder Instructions  Transfers Transfers: Sit to Stand;Stand to Sit Sit to Stand: 4: Min  assist;From chair/3-in-1;From toilet;From bed;With upper extremity assist Stand to Sit: 4: Min assist;To bed;To chair/3-in-1;To toilet;With upper extremity assist             End of Session OT - End of Session Activity Tolerance: Treatment limited secondary to medical complications (Comment);Other (comment) (HR 133 sitting EOB after returning from bathroom) Patient left: in chair;with call bell/phone within reach;with nursing in room  GO     Ruari Mudgett, [place] 03/28/2012, 11:08 AM

## 2012-03-28 NOTE — Plan of Care (Signed)
Problem: Phase II Progression Outcomes Goal: Vital signs stable Outcome: Progressing Tachycardic in 120's with ambulation today. Pt asymptomatic. HR  82-100 at rest.

## 2012-03-28 NOTE — Progress Notes (Signed)
Emily Holloway [id number] October 29, 1955   Subjective:  Pain controlled better w Dilaudid PCA Getting up more No nausea/vomiting Perineal drainage tapering off No events  Objective:  Vital signs:  Filed Vitals:   03/27/12 2245 03/27/12 2333 03/28/12 0215 03/28/12 0541  BP:   92/59 90/45  Pulse: 96  96 99  Temp:   98.4 F (36.9 C) 98.1 F (36.7 C)  TempSrc:   Oral Oral  Resp:  14 15 16   Height:      Weight:      SpO2:  98% 99% 100%    Last BM Date: 03/24/12  Intake/Output   Yesterday:  08/31 0701 - 09/01 0700 In: 1020.8 [I.V.:1020.8] Out: 1920 [Urine:1900; Stool:20] This shift:     Bowel function:  Flatus:No  BM: No  Physical Exam:  General: Pt awake/alert/oriented x4 in no acute distress Eyes: PERRL, normal EOM.  Sclera clear.  No icterus Neuro: CN II-XII intact w/o focal sensory/motor deficits. Lymph: No head/neck/groin lymphadenopathy Psych:  No delerium/psychosis/paranoia HENT: Normocephalic, Mucus membranes moist.  No thrush Neck: Supple, No tracheal deviation Chest: No chest wall pain w good excursion CV:  Pulses intact.  Regular rhythm Abdomen: Soft.  Nondistended.  Ileostomy edematous pink.  No gas/succus.  Mildly tender at perineum.  No incarcerated hernias. Ext:  SCDs BLE.  No mjr edema.  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 female  3 Days Post-Op  Procedure(s): EXAM UNDER ANESTHESIA with rectal biopsy Ileocecal resection End ileostomy  Recurrent rectovaginal fistula s/p ileostomy diversion   Plan: -sip until ileus better -wound care -ileostomy care -VTE prophylaxis- SCDs, etc -mobilize as  tolerated to help recovery -Dilaudid for pain control -path on rectum & ileocecum benign.  No cancer recurrence -anemia - stable  Ardeth Sportsman, M.D., F.A.C.S. Gastrointestinal and Minimally Invasive Surgery [facility] [address] [place], [place] [address] [phone] Main / Paging [phone] Voice Mail   03/28/2012  CARE TEAM:  PCP: Allean Found, MD  Outpatient Care Team: Patient Care Team: Candace Darolyn Rua, MD as PCP - General (Family Medicine) Iva Boop, MD as Consulting Physician (Gastroenterology) Ardeth Sportsman, MD (General Surgery) Martina Sinner, MD as Consulting Physician (Urology)  Inpatient Treatment Team: Treatment Team: Attending Provider: Ardeth Sportsman, MD; Consulting Physician: Bishop Limbo, MD; Registered Nurse: Georgiann Mohs, RN; Registered Nurse: Magda Paganini, RN; Registered Nurse: Laray Anger, RN; Registered Nurse: Mckinley Jewel, RN; Technician: Jonathon Bellows, NT; Registered Nurse: Patsey Berthold, RN; Technician: Burnard Bunting, [place]; Occupational Therapist: Alba Cory, OT   Results:   Labs: No results found for this or any previous visit (from the past 48 hour(s)).  Imaging / Studies: No results found.  Medications / Allergies: per chart  Antibiotics: Anti-infectives     Start     Dose/Rate Route Frequency Ordered Stop   03/25/12 0915   clindamycin (CLEOCIN) 900 mg, gentamicin (GARAMYCIN) 240 mg in sodium chloride 0.9 % 1,000 mL for intraperitoneal lavage         Intraperitoneal To Surgery 03/25/12 0909 03/25/12 0925   03/24/12 1100   fluconazole (DIFLUCAN) IVPB 200  mg        200 mg 100 mL/hr over 60 Minutes Intravenous Daily 03/24/12 1011 03/29/12 0959   03/24/12 1100  piperacillin-tazobactam (ZOSYN) IVPB 3.375 g       3.375 g 12.5 mL/hr over 240 Minutes Intravenous Every 8 hours 03/24/12 1039 03/29/12 1059

## 2012-03-29 LAB — POTASSIUM: Potassium: 3.3 mEq/L — ABNORMAL LOW (ref 3.5–5.1)

## 2012-03-29 LAB — CBC
Hemoglobin: 9.2 g/dL — ABNORMAL LOW (ref 12.0–15.0)
MCH: 29 pg (ref 26.0–34.0)
Platelets: 542 10*3/uL — ABNORMAL HIGH (ref 150–400)
RBC: 3.17 MIL/uL — ABNORMAL LOW (ref 3.87–5.11)
WBC: 6.9 10*3/uL (ref 4.0–10.5)

## 2012-03-29 LAB — MAGNESIUM: Magnesium: 1.7 mg/dL (ref 1.5–2.5)

## 2012-03-29 LAB — PHOSPHORUS: Phosphorus: 3 mg/dL (ref 2.3–4.6)

## 2012-03-29 MED ORDER — METOPROLOL TARTRATE 1 MG/ML IV SOLN
5.0000 mg | Freq: Four times a day (QID) | INTRAVENOUS | Status: DC
  Administered 2012-03-29 – 2012-03-31 (×6): 5 mg via INTRAVENOUS
  Filled 2012-03-29 (×12): qty 5

## 2012-03-29 MED ORDER — METOPROLOL TARTRATE 1 MG/ML IV SOLN
5.0000 mg | Freq: Four times a day (QID) | INTRAVENOUS | Status: DC
  Filled 2012-03-29 (×4): qty 5

## 2012-03-29 NOTE — Consult Note (Signed)
WOC ostomy consult  Stoma type/location:  LLQ, end ileostomy Stomal assessment/size: aprox. 2" round viewed thru the pouch, budded from the skin  Output none, no flatus at this time. Ostomy pouching: 1pc. In place from OR, will order 2pc. 2 3/4" to accommodate stoma size at this time.  Pt prefers 2pc pouch.  Planned pouch change with pt tom.   Education provided: ostomy educational booklets given at the time of preoperative stoma site markings.  She has had ileostomy x 2 in the past. She is very knowledgeable of the pouch change and care of her stoma.  I will support her for first pouch change. She does report that this time the difference in working on the left side of her abdomen will be a bit different for her.  Ostomy supplies ordered to bedside. WOC will follow along with you for assistance with ostomy care. Candie Gintz [place], [place] [phone]

## 2012-03-29 NOTE — Progress Notes (Signed)
Emily Holloway [id number] 1955-09-28   Subjective: Tachycardia wtih walking Pain controlled w Dilaudid PCA No nausea/vomiting WOCN Melody in room  Objective:  Vital signs:  Filed Vitals:   03/28/12 2133 03/29/12 0048 03/29/12 0400 03/29/12 0512  BP: 105/70 106/69  112/74  Pulse: 98 101  96  Temp: 98.3 F (36.8 C) 97.8 F (36.6 C)  97.9 F (36.6 C)  TempSrc: Oral Oral  Oral  Resp: 16 16 17 20   Height:      Weight:      SpO2: 98% 96% 98% 97%    Last BM Date: 03/24/12  Intake/Output   Yesterday:  09/01 0701 - 09/02 0700 In: 1306.7 [P.O.:120; I.V.:1186.7] Out: 1150 [Urine:1150] This shift:     Bowel function:  Flatus:No  BM: No  Physical Exam:  General: Pt awake/alert/oriented x4 in no acute distress Eyes: PERRL, normal EOM.  Sclera clear.  No icterus Neuro: CN II-XII intact w/o focal sensory/motor deficits. Lymph: No head/neck/groin lymphadenopathy Psych:  No delerium/psychosis/paranoia HENT: Normocephalic, Mucus membranes moist.  No thrush Neck: Supple, No tracheal deviation Chest: No chest wall pain w good excursion CV:  Pulses intact.  Regular rhythm.  HR 100-110 Abdomen: Soft.  Nondistended.  Ileostomy edematous pink.  No gas/succus.  Mildly tender at perineum.  Wound midline incision clean.  No incarcerated hernias. Ext:  SCDs BLE.  No mjr edema.  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 female  4 Days Post-Op  Procedure(s): EXAM UNDER ANESTHESIA with rectal biopsy Ileocecal resection End ileostomy  Recurrent rectovaginal fistula s/p ileostomy diversion   Plan: -B blocker for tachy but watch out for  hypotension -anemia - check to make sure no tapering off -try clears -wound care -ileostomy care -VTE prophylaxis- SCDs, etc -mobilize as tolerated to help recovery -Dilaudid for pain control  Ardeth Sportsman, M.D., F.A.C.S. Gastrointestinal and Minimally Invasive Surgery [facility] [address], [address] [place], [place] [address] [phone] Main / Paging [phone] Voice Mail   03/29/2012  CARE TEAM:  PCP: Allean Found, MD  Outpatient Care Team: Patient Care Team: Candace Darolyn Rua, MD as PCP - General (Family Medicine) Iva Boop, MD as Consulting Physician (Gastroenterology) Ardeth Sportsman, MD (General Surgery) Martina Sinner, MD as Consulting Physician (Urology)  Inpatient Treatment Team: Treatment Team: Attending Provider: Ardeth Sportsman, MD; Consulting Physician: Bishop Limbo, MD; Registered Nurse: Georgiann Mohs, RN; Registered Nurse: Magda Paganini, RN; Registered Nurse: Laray Anger, RN; Registered Nurse: Mckinley Jewel, RN; Registered Nurse: Patsey Berthold, RN; Technician: Karl Pock, NT; Technician: Vella Raring, NT   Results:   Labs: No results found for this or any previous visit (from the past 48 hour(s)).  Imaging / Studies: No results found.  Medications / Allergies: per chart  Antibiotics: Anti-infectives     Start     Dose/Rate Route Frequency Ordered Stop   03/25/12 0915   clindamycin (CLEOCIN) 900 mg, gentamicin (GARAMYCIN) 240 mg in sodium chloride 0.9 % 1,000 mL for intraperitoneal lavage         Intraperitoneal To Surgery 03/25/12 0909 03/25/12 0925   03/24/12 1100   fluconazole (  DIFLUCAN) IVPB 200 mg        200 mg 100 mL/hr over 60 Minutes Intravenous Daily 03/24/12 1011 03/28/12 1054   03/24/12 1100  piperacillin-tazobactam (ZOSYN) IVPB 3.375 g       3.375 g 12.5 mL/hr over 240 Minutes Intravenous Every 8 hours 03/24/12 1039 03/29/12 1059

## 2012-03-29 NOTE — Progress Notes (Signed)
Physical Therapy Treatment Patient Details Name: Pairlee Sawtell MRN: [id number] DOB: 04-02-1956 Today's Date: 03/29/2012 Time: 0865-7846 PT Time Calculation (min): 42 min  PT Assessment / Plan / Recommendation Comments on Treatment Session  Pt assisted up to bathroom, performed toileting and hygiene with min assist required to don/doff mesh undergarments only    Follow Up Recommendations  Home health PT;Supervision/Assistance - 24 hour    Barriers to Discharge        Equipment Recommendations  None recommended by OT    Recommendations for Other Services    Frequency Min 3X/week   Plan Discharge plan remains appropriate    Precautions / Restrictions Precautions Precautions: Fall Precaution Comments: Pt needs new pad/underwear before walking into the hallway due to excessive amounts of vaginal discharge Restrictions Weight Bearing Restrictions: No   Pertinent Vitals/Pain Min c/o pain    Mobility  Bed Mobility Bed Mobility: Supine to Sit;Sit to Supine;Sitting - Scoot to [place] of Bed;Rolling Right;Rolling Left;Right Sidelying to Sit Rolling Right: 6: Modified independent (Device/Increase time) Rolling Left: 6: Modified independent (Device/Increase time) Right Sidelying to Sit: 6: Modified independent (Device/Increase time) Supine to Sit: HOB elevated;6: Modified independent (Device/Increase time) Sitting - Scoot to Edge of Bed: 6: Modified independent (Device/Increase time);With rail Sit to Supine: 6: Modified independent (Device/Increase time);HOB flat Sit to Sidelying Left: 6: Modified independent (Device/Increase time);With rail;HOB elevated Details for Bed Mobility Assistance: heavy reliance on HOB elevated Transfers Transfers: Sit to Stand;Stand to Sit Sit to Stand: 4: Min guard;From bed;From toilet;With upper extremity assist Stand to Sit: 4: Min guard;To bed;To toilet;With upper extremity assist Ambulation/Gait Ambulation/Gait Assistance: 4: Min guard Ambulation  Distance (Feet): 400 Feet Assistive device: Other (Comment) (IV pole) Gait Pattern: Step-through pattern;Shuffle;Trunk flexed    Exercises     PT Diagnosis:    PT Problem List:   PT Treatment Interventions:     PT Goals Acute Rehab PT Goals PT Goal Formulation: With patient Time For Goal Achievement: 04/10/12 Potential to Achieve Goals: Good Pt will go Sit to Stand: with modified independence PT Goal: Sit to Stand - Progress: Progressing toward goal Pt will go Stand to Sit: with modified independence PT Goal: Stand to Sit - Progress: Progressing toward goal Pt will Ambulate: >150 feet;with modified independence;with least restrictive assistive device;with gait velocity >(comment) ft/second PT Goal: Ambulate - Progress: Progressing toward goal  Visit Information  Last PT Received On: 03/29/12 Assistance Needed: +1    Subjective Data  Subjective: I want to get up and move around a bit Patient Stated Goal: to keep her strength and to stop her vaginal drainage   Cognition  Overall Cognitive Status: Appears within functional limits for tasks assessed/performed Arousal/Alertness: Awake/alert Orientation Level: Appears intact for tasks assessed Behavior During Session: [facility] for tasks performed    Balance     End of Session PT - End of Session Activity Tolerance: Patient tolerated treatment well Patient left: in bed;with call bell/phone within reach;with nursing in room Nurse Communication: Mobility status   GP     Syla Devoss 03/29/2012, 12:58 PM

## 2012-03-30 MED ORDER — MAGNESIUM SULFATE 40 MG/ML IJ SOLN
2.0000 g | Freq: Once | INTRAMUSCULAR | Status: AC
  Administered 2012-03-30: 2 g via INTRAVENOUS
  Filled 2012-03-30: qty 50

## 2012-03-30 MED ORDER — POTASSIUM CHLORIDE 10 MEQ/100ML IV SOLN
10.0000 meq | INTRAVENOUS | Status: AC
  Administered 2012-03-30 (×4): 10 meq via INTRAVENOUS
  Filled 2012-03-30 (×4): qty 100

## 2012-03-30 MED ORDER — TRAMADOL HCL 50 MG PO TABS
50.0000 mg | ORAL_TABLET | Freq: Four times a day (QID) | ORAL | Status: DC | PRN
  Administered 2012-03-30: 50 mg via ORAL
  Administered 2012-03-31: 100 mg via ORAL
  Administered 2012-04-01: 50 mg via ORAL
  Filled 2012-03-30: qty 2
  Filled 2012-03-30 (×3): qty 1

## 2012-03-30 MED ORDER — POTASSIUM CHLORIDE CRYS ER 20 MEQ PO TBCR
40.0000 meq | EXTENDED_RELEASE_TABLET | Freq: Every day | ORAL | Status: DC
  Administered 2012-03-30: 40 meq via ORAL
  Filled 2012-03-30 (×2): qty 2

## 2012-03-30 NOTE — Progress Notes (Signed)
Physical Therapy Treatment Patient Details Name: Neita Landrigan MRN: [id number] DOB: 1956-04-06 Today's Date: 03/30/2012 Time: 4098-1191 PT Time Calculation (min): 30 min  PT Assessment / Plan / Recommendation Comments on Treatment Session  Amb pt in hallway then asssited to bathroom.  Pt moving well and very motivated.    Follow Up Recommendations  Home health PT    Barriers to Discharge        Equipment Recommendations  None recommended by PT;None recommended by OT    Recommendations for Other Services    Frequency Min 3X/week   Plan      Precautions / Restrictions Precautions Precautions: Fall Restrictions Weight Bearing Restrictions: No    Pertinent Vitals/Pain C/o "discomfort" in vaginal area    Mobility  Bed Mobility Bed Mobility: Not assessed Rolling Right: 6: Modified independent (Device/Increase time) Right Sidelying to Sit: 6: Modified independent (Device/Increase time) Sitting - Scoot to Edge of Bed: 6: Modified independent (Device/Increase time) Details for Bed Mobility Assistance: Pt OOB in chair  Transfers Transfers: Sit to Stand;Stand to Sit Sit to Stand: 5: Supervision;From chair/3-in-1;From toilet Stand to Sit: 5: Supervision;To toilet Details for Transfer Assistance: good safety tech   Ambulation/Gait Ambulation/Gait Assistance: 4: Min guard Ambulation Distance (Feet): 400 Feet Assistive device:  (IV pole) Ambulation/Gait Assistance Details: held onto IV pole with both hands, mildly flexed posture and slow gait speed Gait Pattern: Step-through pattern;Trunk flexed;Shuffle     PT Goals                         progressing   Visit Information  Last PT Received On: 03/30/12 Assistance Needed: +1    Subjective Data      Cognition  Overall Cognitive Status: Appears within functional limits for tasks assessed/performed Arousal/Alertness: Awake/alert Orientation Level: Appears intact for tasks assessed Behavior During Session: [facility] for tasks  performed    Balance   good  End of Session PT - End of Session Equipment Utilized During Treatment: Gait belt Activity Tolerance: Patient tolerated treatment well Patient left: in bed;with call bell/phone within reach Nurse Communication: Mobility status (RN observed)   Felecia Shelling  PTA [facility]  Acute  Rehab Pager     [phone]

## 2012-03-30 NOTE — Consult Note (Signed)
WOC ostomy consult  Stoma type/location: LLQ, end ileostomy Stomal assessment/size: 1 3/4" round, budded well from the skin, pink and moist Peristomal assessment: intact Treatment options for stomal/peristomal skin: none Output liquid green and some flatus Ostomy pouching: 2pc. 2 1/4" pouching system used, cut to fit at 1 3/4" Education provided: pt has had 2 previous ileostomies, she is very familiar with pouching.  WOC changed pouch today with pt watching due to IV hurting her arm.  She has supplies at home from previous ostomy that she will continue to use once home.  I will re enroll her in [place] [facility] start dc program.  WOC will follow along with you for ostomy care Dallys Nowakowski Marlena Clipper, [place] [phone]

## 2012-03-30 NOTE — Progress Notes (Signed)
Pt. Refused dressing change this shift. Stated she is hurting and not ready yet.

## 2012-03-30 NOTE — Progress Notes (Signed)
Lauralye Holloway [id number] 02-05-1956   Subjective:  Tachycardia resolved Walking more Pain controlled w Dilaudid PCA No nausea/vomiting Tolerating liquids Less perineal drainage  Objective:  Vital signs:  Filed Vitals:   03/29/12 2206 03/30/12 0018 03/30/12 0222 03/30/12 0600  BP: 106/74 105/72 104/70 112/79  Pulse: 91 87 82 93  Temp: 98.1 F (36.7 C)  98.1 F (36.7 C) 97.7 F (36.5 C)  TempSrc: Oral  Oral Oral  Resp: 18 18 18 18   Height:      Weight:      SpO2: 96%  99% 96%    Last BM Date: 03/24/12  Intake/Output   Yesterday:  09/02 0701 - 09/03 0700 In: 1120 [P.O.:720; I.V.:400] Out: 2250 [Urine:1600; Stool:650] This shift:  Total I/O In: 720 [P.O.:720] Out: 1500 [Urine:1000; Stool:500]  Bowel function:  Flatus:Y  BM: Y  Physical Exam:  General: Pt awake/alert/oriented x4 in no acute distress Eyes: PERRL, normal EOM.  Sclera clear.  No icterus Neuro: CN II-XII intact w/o focal sensory/motor deficits. Lymph: No head/neck/groin lymphadenopathy Psych:  No delerium/psychosis/paranoia HENT: Normocephalic, Mucus membranes moist.  No thrush Neck: Supple, No tracheal deviation Chest: No chest wall pain w good excursion CV:  Pulses intact.  Regular rhythm.  HR 100-110 Abdomen: Soft.  Nondistended.  Ileostomy edematous pink.  + gas/succus.  Mildly tender at perineum.  Wound midline incision clean.  No incarcerated hernias. Ext:  SCDs BLE.  No mjr edema.  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 female  5 Days Post-Op  Procedure(s): EXAM UNDER ANESTHESIA with rectal biopsy Ileocecal resection End ileostomy  Recurrent  rectovaginal fistula s/p ileostomy diversion, ileus resolving  Plan: -B blocker for tachy but watch out for hypotension -anemia - check to make sure no tapering off.  Start PO iron -adv diet -wound care -ileostomy care -VTE prophylaxis- SCDs, etc -mobilize as tolerated to help recovery -Dilaudid for pain control. Wean PCA gradually -will need & request HH, esp for wound care  Ardeth Sportsman, M.D., F.A.C.S. Gastrointestinal and Minimally Invasive Surgery [facility] [address], [address] [place], [place] [address] [phone] Main / Paging [phone] Voice Mail   03/30/2012  CARE TEAM:  PCP: Allean Found, MD  Outpatient Care Team: Patient Care Team: Candace Darolyn Rua, MD as PCP - General (Family Medicine) Iva Boop, MD as Consulting Physician (Gastroenterology) Ardeth Sportsman, MD (General Surgery) Martina Sinner, MD as Consulting Physician (Urology)  Inpatient Treatment Team: Treatment Team: Attending Provider: Ardeth Sportsman, MD; Registered Nurse: Georgiann Mohs, RN; Registered Nurse: Magda Paganini, RN; Registered Nurse: Laray Anger, RN; Registered Nurse: Mckinley Jewel, RN; Registered Nurse: Patsey Berthold, RN; Technician: Karl Pock, NT   Results:   Labs: Results for orders placed during the hospital encounter of 03/24/12 (from the past 48 hour(s))  CBC     Status: Abnormal   Collection Time   03/29/12 10:37 AM      Component Value Range Comment   WBC 6.9  4.0 - 10.5 K/uL    RBC 3.17 (*) 3.87 - 5.11 MIL/uL    Hemoglobin 9.2 (*) 12.0 - 15.0 g/dL  HCT 29.3 (*) 36.0 - 46.0 %    MCV 92.4  78.0 - 100.0 fL    MCH 29.0  26.0 - 34.0 pg    MCHC 31.4  30.0 - 36.0 g/dL    RDW 40.9 (*) 81.1 - 15.5 %    Platelets 542 (*) 150 - 400 K/uL   MAGNESIUM     Status: Normal   Collection Time   03/29/12 10:37 AM      Component Value Range Comment   Magnesium 1.7  1.5 - 2.5 mg/dL     PHOSPHORUS     Status: Normal   Collection Time   03/29/12 10:37 AM      Component Value Range Comment   Phosphorus 3.0  2.3 - 4.6 mg/dL   POTASSIUM     Status: Abnormal   Collection Time   03/29/12 10:37 AM      Component Value Range Comment   Potassium 3.3 (*) 3.5 - 5.1 mEq/L     Imaging / Studies: No results found.  Medications / Allergies: per chart  Antibiotics: Anti-infectives     Start     Dose/Rate Route Frequency Ordered Stop   03/25/12 0915   clindamycin (CLEOCIN) 900 mg, gentamicin (GARAMYCIN) 240 mg in sodium chloride 0.9 % 1,000 mL for intraperitoneal lavage         Intraperitoneal To Surgery 03/25/12 0909 03/25/12 0925   03/24/12 1100   fluconazole (DIFLUCAN) IVPB 200 mg        200 mg 100 mL/hr over 60 Minutes Intravenous Daily 03/24/12 1011 03/28/12 1054   03/24/12 1100  piperacillin-tazobactam (ZOSYN) IVPB 3.375 g       3.375 g 12.5 mL/hr over 240 Minutes Intravenous Every 8 hours 03/24/12 1039 03/29/12 0957

## 2012-03-30 NOTE — Progress Notes (Signed)
Occupational Therapy Treatment Patient Details Name: Emily Holloway MRN: [id number] DOB: 19-Dec-1955 Today's Date: 03/30/2012 Time: 4098-1191 OT Time Calculation (min): 33 min  OT Assessment / Plan / Recommendation Comments on Treatment Session Pt tolerated session well. States husband available at discharge to help PRN.    Follow Up Recommendations  Home health OT;Supervision/Assistance - 24 hour    Barriers to Discharge       Equipment Recommendations  None recommended by OT    Recommendations for Other Services    Frequency Min 2X/week   Plan Discharge plan remains appropriate    Precautions / Restrictions Precautions Precautions: Fall Precaution Comments: Pt needs new pad/underwear before walking into the hallway due to excessive amounts of vaginal discharge Restrictions Weight Bearing Restrictions: No        ADL  Grooming: Performed;Wash/dry hands;Wash/dry face;Teeth care;Supervision/safety Where Assessed - Grooming: Unsupported standing Toilet Transfer: Performed;Supervision/safety Toilet Transfer Method: Other (comment) (ambulating, hold IV pole intermittantly) Toilet Transfer Equipment: Comfort height toilet;Grab bars Toileting - Clothing Manipulation and Hygiene: Performed;Minimal assistance;Other (comment) (to wash posterior periarea thoroughly.) Where Assessed - Toileting Clothing Manipulation and Hygiene: Sit to stand from 3-in-1 or toilet ADL Comments: Pt with discharge immediately upon pulling down mesh underwear. Pt changed pad and wiped periareas with assist for posterior periarea due to painful to twist and reach behind her. Educated on toilet aid option and using reacher for LB ADL. Pt HR after activity 110.     OT Diagnosis:    OT Problem List:   OT Treatment Interventions:     OT Goals ADL Goals ADL Goal: Grooming - Progress: Progressing toward goals ADL Goal: Toilet Transfer - Progress: Progressing toward goals ADL Goal: Toileting - Clothing  Manipulation - Progress: Progressing toward goals ADL Goal: Toileting - Hygiene - Progress: Progressing toward goals  Visit Information  Last OT Received On: 03/30/12 Assistance Needed: +1    Subjective Data  Subjective: I would like to go to the bathroom and wash my face Patient Stated Goal: as above   Prior Functioning       Cognition  Overall Cognitive Status: Appears within functional limits for tasks assessed/performed Arousal/Alertness: Awake/alert Orientation Level: Appears intact for tasks assessed Behavior During Session: [facility] for tasks performed    Mobility  Shoulder Instructions Bed Mobility Rolling Right: 6: Modified independent (Device/Increase time) Right Sidelying to Sit: 6: Modified independent (Device/Increase time) Sitting - Scoot to Edge of Bed: 6: Modified independent (Device/Increase time) Transfers Transfers: Sit to Stand;Stand to Sit Sit to Stand: 5: Supervision;From bed;From toilet Stand to Sit: 5: Supervision;To chair/3-in-1;To toilet       Exercises      Balance     End of Session OT - End of Session Activity Tolerance: Patient tolerated treatment well Patient left: in chair;with call bell/phone within reach  GO     Lennox Laity [phone] 03/30/2012, 9:56 AM

## 2012-03-31 MED ORDER — BISMUTH SUBSALICYLATE 262 MG/15ML PO SUSP
30.0000 mL | Freq: Two times a day (BID) | ORAL | Status: DC
  Administered 2012-03-31 – 2012-04-01 (×4): 30 mL via ORAL
  Filled 2012-03-31: qty 236

## 2012-03-31 MED ORDER — METOPROLOL TARTRATE 12.5 MG HALF TABLET
12.5000 mg | ORAL_TABLET | Freq: Two times a day (BID) | ORAL | Status: DC
  Administered 2012-03-31 – 2012-04-02 (×6): 12.5 mg via ORAL
  Filled 2012-03-31 (×9): qty 1

## 2012-03-31 MED ORDER — BOOST PLUS PO LIQD
237.0000 mL | Freq: Three times a day (TID) | ORAL | Status: DC
  Filled 2012-03-31: qty 237

## 2012-03-31 MED ORDER — LOPERAMIDE HCL 2 MG PO CAPS: 2.0000 mg | ORAL_CAPSULE | Freq: Three times a day (TID) | ORAL | Status: DC | PRN

## 2012-03-31 MED ORDER — POTASSIUM CHLORIDE CRYS ER 20 MEQ PO TBCR
40.0000 meq | EXTENDED_RELEASE_TABLET | Freq: Two times a day (BID) | ORAL | Status: DC
  Administered 2012-03-31 – 2012-04-02 (×4): 40 meq via ORAL
  Administered 2012-04-02: 20 meq via ORAL
  Filled 2012-03-31 (×7): qty 2

## 2012-03-31 MED ORDER — BOOST PLUS PO LIQD
237.0000 mL | Freq: Three times a day (TID) | ORAL | Status: DC
  Administered 2012-03-31 – 2012-04-02 (×5): 237 mL via ORAL
  Filled 2012-03-31 (×12): qty 237

## 2012-03-31 NOTE — Progress Notes (Signed)
PT Cancellation Note  Treatment cancelled today due to pt declining OOB with PT at this time-just received pain meds and pt prefers to rest. Will check back another day. Thanks.  Rebeca Alert [facility] 03/31/2012, 3:25 PM [phone]

## 2012-03-31 NOTE — Progress Notes (Signed)
Nutrition Follow-up  Intervention: Boost Plus TID per pt preference and for additional protein needs. Educated pt about ileostomy diet and provided handout of this information. Pt eating excellent, will sign off.   Diet Order: Parke Simmers, 100% meal intake  - Pt reports doing well with bland diet, denies any nausea. Discussed diet for ileostomy which pt was very well aware of r/t having ileostomy in the past. Reviewed odor producing foods and foods that can lessen/increase stool.   Meds: Scheduled Meds:   . acetaminophen  1,000 mg Oral TID  . bismuth subsalicylate  30 mL Oral BID  . lactose free nutrition  237 mL Oral TID WC  . lip balm  1 application Topical BID  . metoprolol tartrate  12.5 mg Oral BID  . potassium chloride  40 mEq Oral BID  . saccharomyces boulardii  250 mg Oral BID  . DISCONTD: lactose free nutrition  237 mL Oral TID WC  . DISCONTD: metoprolol  5 mg Intravenous Q6H  . DISCONTD: potassium chloride  40 mEq Oral Daily   Continuous Infusions:   . DISCONTD: dextrose 5% lactated ringers 30 mL/hr at 03/30/12 1200   PRN Meds:.alum & mag hydroxide-simeth, diphenhydrAMINE, HYDROmorphone (DILAUDID) injection, hyoscyamine, loperamide, LORazepam, magic mouthwash, naloxone, ondansetron, promethazine, sodium chloride, traMADol  Labs:  CMP     Component Value Date/Time   NA 137 03/26/2012 0323   K 3.3* 03/29/2012 1037   CL 100 03/26/2012 0323   CO2 30 03/26/2012 0323   GLUCOSE 166* 03/26/2012 0323   BUN <3* 03/26/2012 0323   CREATININE 0.52 03/26/2012 0323   CREATININE 0.58 03/16/2012 1058   CALCIUM 7.9* 03/26/2012 0323   PROT 6.6 03/24/2012 1050   ALBUMIN 2.7* 03/24/2012 1050   AST 17 03/24/2012 1050   ALT 38* 03/24/2012 1050   ALKPHOS 67 03/24/2012 1050   BILITOT 0.2* 03/24/2012 1050   GFRNONAA >90 03/26/2012 0323   GFRAA >90 03/26/2012 0323     Intake/Output Summary (Last 24 hours) at 03/31/12 1500 Last data filed at 03/31/12 1451  Gross per 24 hour  Intake   2390 ml  Output    3700 ml  Net  -1310 ml   Ileostomy output - total yesterday  Weight Status:   8/30 161 lb 6 oz 8/31 156 lb 11.2 oz  Estimated needs:   1700-1900 calories 70-80g protein  Nutrition Dx: Inadequate oral intake - resolved   Goal: Diet advancement - met   Monitor: Intake  Levon Hedger MS, RD, LDN [phone] Pager [phone] After Hours Pager

## 2012-03-31 NOTE — Progress Notes (Signed)
Emily Holloway [id number] 11-Feb-1956   Subjective:  Walking Pain controlled  No nausea/vomiting Tolerating liquids Husband in room  Objective:  Vital signs:  Filed Vitals:   03/30/12 1400 03/30/12 1502 03/30/12 2204 03/31/12 0552  BP: 97/65 97/59 109/68 114/79  Pulse: 100 99 96 84  Temp: 97.9 F (36.6 C)  97.8 F (36.6 C) 97.7 F (36.5 C)  TempSrc: Oral  Oral Oral  Resp: 18  18 18   Height:      Weight:      SpO2: 93%  97% 97%    Last BM Date: 03/24/12  Intake/Output   Yesterday:  09/03 0701 - 09/04 0700 In: 2350 [P.O.:720; I.V.:1630] Out: 3450 [Urine:2200; Stool:1250] This shift:  Total I/O In: 830 [P.O.:480; I.V.:350] Out: 2600 [Urine:1700; Stool:900]  Bowel function:  Flatus:Y  BM: Y, larger volume, liquid  Physical Exam:  General: Pt awake/alert/oriented x4 in no acute distress Eyes: PERRL, normal EOM.  Sclera clear.  No icterus Neuro: CN II-XII intact w/o focal sensory/motor deficits. Lymph: No head/neck/groin lymphadenopathy Psych:  No delerium/psychosis/paranoia HENT: Normocephalic, Mucus membranes moist.  No thrush Neck: Supple, No tracheal deviation Chest: No chest wall pain w good excursion CV:  Pulses intact.  Regular rhythm.  HR 100-110 Abdomen: Soft.  Nondistended.  Ileostomy edematous pink.  + gas/succus.  Mildly tender at perineum.  Wound midline incision clean w fair granulation.  No incarcerated hernias. Ext:  SCDs BLE.  No mjr edema.  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 female  6 Days Post-Op  Procedure(s): EXAM UNDER ANESTHESIA with rectal biopsy Ileocecal resection End  ileostomy  Recurrent rectovaginal fistula s/p ileostomy diversion, ileus resolving  Plan: -adv diet -wound care -ileostomy care -B blocker for tachy but watch out for hypotension -anemia -  PO iron -VTE prophylaxis- SCDs, etc -mobilize as tolerated to help recovery -will need & request HH, esp for wound care  Ardeth Sportsman, M.D., F.A.C.S. Gastrointestinal and Minimally Invasive Surgery [facility] [address], [address] [place], [place] [address] [phone] Main / Paging [phone] Voice Mail   03/31/2012  CARE TEAM:  PCP: Allean Found, MD  Outpatient Care Team: Patient Care Team: Candace Darolyn Rua, MD as PCP - General (Family Medicine) Iva Boop, MD as Consulting Physician (Gastroenterology) Ardeth Sportsman, MD (General Surgery) Martina Sinner, MD as Consulting Physician (Urology)  Inpatient Treatment Team: Treatment Team: Attending Provider: Ardeth Sportsman, MD; Registered Nurse: Georgiann Mohs, RN; Registered Nurse: Magda Paganini, RN; Registered Nurse: Laray Anger, RN; Registered Nurse: Mckinley Jewel, RN; Registered Nurse: Patsey Berthold, RN; Technician: Karl Pock, NT; Technician: Lynden Ang, NT; Registered Nurse: Skipper Cliche, RN; Registered Nurse: Bennetta Laos, RN   Results:   Labs: Results for orders placed during the hospital encounter of 03/24/12 (from the past 48 hour(s))  CBC     Status: Abnormal   Collection Time   03/29/12 10:37 AM      Component Value Range Comment   WBC 6.9  4.0 - 10.5 K/uL    RBC 3.17 (*) 3.87 - 5.11 MIL/uL    Hemoglobin 9.2 (*) 12.0 -  15.0 g/dL    HCT 78.2 (*) 95.6 - 46.0 %    MCV 92.4  78.0 - 100.0 fL    MCH 29.0  26.0 - 34.0 pg    MCHC 31.4  30.0 - 36.0 g/dL    RDW 21.3 (*) 08.6 - 15.5 %    Platelets 542 (*) 150 - 400 K/uL   MAGNESIUM     Status: Normal   Collection Time   03/29/12 10:37 AM      Component Value Range Comment    Magnesium 1.7  1.5 - 2.5 mg/dL   PHOSPHORUS     Status: Normal   Collection Time   03/29/12 10:37 AM      Component Value Range Comment   Phosphorus 3.0  2.3 - 4.6 mg/dL   POTASSIUM     Status: Abnormal   Collection Time   03/29/12 10:37 AM      Component Value Range Comment   Potassium 3.3 (*) 3.5 - 5.1 mEq/L     Imaging / Studies: No results found.  Medications / Allergies: per chart  Antibiotics: Anti-infectives     Start     Dose/Rate Route Frequency Ordered Stop   03/25/12 0915   clindamycin (CLEOCIN) 900 mg, gentamicin (GARAMYCIN) 240 mg in sodium chloride 0.9 % 1,000 mL for intraperitoneal lavage         Intraperitoneal To Surgery 03/25/12 0909 03/25/12 0925   03/24/12 1100   fluconazole (DIFLUCAN) IVPB 200 mg        200 mg 100 mL/hr over 60 Minutes Intravenous Daily 03/24/12 1011 03/28/12 1054   03/24/12 1100  piperacillin-tazobactam (ZOSYN) IVPB 3.375 g       3.375 g 12.5 mL/hr over 240 Minutes Intravenous Every 8 hours 03/24/12 1039 03/29/12 0957

## 2012-03-31 NOTE — Care Management Note (Signed)
    Page 1 of 2   03/31/2012     2:29:39 PM   CARE MANAGEMENT NOTE 03/31/2012  Patient:  Emily Holloway, Emily Holloway   Account Number:  [id number]  Date Initiated:  03/26/2012  Documentation initiated by:  DAVIS,RHONDA  Subjective/Objective Assessment:   pt with history of recurrent rectovaginal fistulas and rectal ca, recent surg for the same with ileostomty , dcd to home for this on [id number] represented with fecal drainage from the vaginas taken to or and repair of anastomosis leakage     Action/Plan:   from home   Anticipated DC Date:  04/02/2012   Anticipated DC Plan:  HOME W HOME HEALTH SERVICES  In-house referral  NA      DC Planning Services  CM consult      [facility] Choice  HOME HEALTH   Choice offered to / List presented to:  C-1 Patient   DME arranged  NA      DME agency  NA     HH arranged  HH-1 RN  HH-2 PT      HH agency  [facility]   Status of service:  Completed, signed off Medicare Important Message given?  NA - LOS <3 / Initial given by admissions (If response is "NO", the following Medicare IM given date fields will be blank) Date Medicare IM given:   Date Additional Medicare IM given:    Discharge Disposition:  HOME W HOME HEALTH SERVICES  Per UR Regulation:  Reviewed for med. necessity/level of care/duration of stay  If discussed at Long Length of Stay Meetings, dates discussed:    Comments:  03-31-12 Lorenda Ishihara RN CM 1400 Spoke with patient and husband at bedside yesterday to discuss d/c plans. Provided them with [facility] agency list to look over. Returned today to f/u. Patient decided on [facility] for [facility] services. Contacted Kirsten with [facility] to arrange, patient will need wound care and recommendations for PT/OT. Patient is agreeable to RN and PT but does not feel she needs OT. States her husband will be available 24/7 and will assist with ADL's. Encouraged patient to discuss with her physician for final plans.  [id number] Earlene Plater, RN, BSN, CCM: CHART  REVIEWED AND UPDATED. NO DISCHARGE NEEDS PRESENT AT THIS TIME. CASE MANAGEMENT [phone]

## 2012-04-01 MED ORDER — HYDROMORPHONE HCL 2 MG PO TABS
2.0000 mg | ORAL_TABLET | ORAL | Status: DC | PRN
  Administered 2012-04-01 – 2012-04-03 (×9): 4 mg via ORAL
  Filled 2012-04-01 (×9): qty 2

## 2012-04-01 MED ORDER — ACETAMINOPHEN 500 MG PO TABS
1000.0000 mg | ORAL_TABLET | Freq: Four times a day (QID) | ORAL | Status: DC
  Administered 2012-04-01 – 2012-04-03 (×8): 1000 mg via ORAL
  Filled 2012-04-01 (×9): qty 2

## 2012-04-01 MED ORDER — GABAPENTIN 100 MG PO CAPS
200.0000 mg | ORAL_CAPSULE | Freq: Three times a day (TID) | ORAL | Status: DC
  Administered 2012-04-01 – 2012-04-03 (×7): 200 mg via ORAL
  Filled 2012-04-01 (×9): qty 2

## 2012-04-01 MED ORDER — TRAMADOL HCL 50 MG PO TABS
100.0000 mg | ORAL_TABLET | ORAL | Status: DC | PRN
  Administered 2012-04-01 – 2012-04-03 (×5): 100 mg via ORAL
  Filled 2012-04-01 (×5): qty 2

## 2012-04-01 MED ORDER — HYDROMORPHONE HCL PF 1 MG/ML IJ SOLN
0.5000 mg | INTRAMUSCULAR | Status: DC | PRN
  Administered 2012-04-01: 1 mg via INTRAVENOUS
  Filled 2012-04-01: qty 1

## 2012-04-01 NOTE — Consult Note (Signed)
WOC ostomy follow up  Stoma type/location: LLQ, end ilestomy Stomal assessment/size: pink and moist, 1 3/4" round and nicely budded from skin Output liquid brown-green Ostomy pouching: 2pc. 2 1/4" in place, will plan pouch change with pt tom per her request she has been independent with pouching for some time.   Education provided:  Pt in independent with ostomy pouching routines, care of stoma and peristomal skin and emptying of pouch.  Enrolled in Secure Start dc program with [facility] to send additional samples to the pts home.    WOC will follow along with you for assistance as needed with ostomy care. Jabriel Vanduyne [place], [place] [phone]

## 2012-04-01 NOTE — Progress Notes (Addendum)
Physical Therapy Treatment Patient Details Name: Emily Holloway MRN: [id number] DOB: 09-27-1955 Today's Date: 04/01/2012 Time: 1450-1500 PT Time Calculation (min): 10 min  PT Assessment / Plan / Recommendation Comments on Treatment Session  Will d/c from acute PT. All further needs can be addressed with HHPT. Pt mobilizing well-slow, steady. Recommended pt use RW for ambulation during stay and after discharge home-pt agreeable. Discussed stair negotiation-both pt and husband state they will not have difficulty getting into home. Based on session, pt does not have any further acute PT needs at this time. Pt may still benefit from HHPT to continue to maximize independence and progress to strengthening program once able/tolerable. Encouraged pt to ambulate often, as tolerated, with use of RW.     Follow Up Recommendations  Home health PT    Barriers to Discharge        Equipment Recommendations  None recommended by PT    Recommendations for Other Services    Frequency Min 3X/week   Plan Discharge plan remains appropriate;All goals met and education completed, patient dischaged from PT services    Precautions / Restrictions Precautions Precautions: Fall Restrictions Weight Bearing Restrictions: No   Pertinent Vitals/Pain 4/10 abdominal pain    Mobility  Bed Mobility Sit to Supine: 6: Modified independent (Device/Increase time) Transfers Transfers: Sit to Stand;Stand to Sit Stand to Sit: 6: Modified independent (Device/Increase time) Ambulation/Gait Ambulation/Gait Assistance: 6: Modified independent (Device/Increase time) Ambulation Distance (Feet): 500 Feet Assistive device: Rolling walker Ambulation/Gait Assistance Details: Pt up ambulating in hallway with husband. Noted pt was holding onto husband's hand AND handrail in hallway. Gave pt a RW to use. Safety, stability much better. No external assist needed with walker use.  Gait Pattern: Step-to pattern    Exercises     PT  Diagnosis:    PT Problem List:   PT Treatment Interventions:     PT Goals Acute Rehab PT Goals PT Goal: Stand to Sit - Progress: Met PT Goal: Ambulate - Progress: Met PT Goal: Up/Down Stairs - Progress: Discontinued (comment) (pt and husband state they will not have difficulty with this)  Visit Information  Last PT Received On: 04/01/12 Assistance Needed: +1    Subjective Data  Subjective: "I made a deal with the nurses-walk for pain meds" Patient Stated Goal: Home   Cognition  Overall Cognitive Status: Appears within functional limits for tasks assessed/performed Arousal/Alertness: Awake/alert Orientation Level: Appears intact for tasks assessed Behavior During Session: [facility] for tasks performed    Balance     End of Session PT - End of Session Activity Tolerance: Patient tolerated treatment well Patient left: in bed;with call bell/phone within reach;with family/visitor present   GP     Rebeca Alert [facility] 04/01/2012, 3:26 PM [phone]

## 2012-04-01 NOTE — Progress Notes (Signed)
Emily Holloway [id number] 11/29/1955   Subjective:  Walking less. Not wanting to work w PT as much Pain control fair - prefers Dilaudid IV w dressing changes No nausea/vomiting Tolerating liquids.  Early satiety  Objective:  Vital signs:  Filed Vitals:   03/31/12 1028 03/31/12 1406 03/31/12 2200 04/01/12 0600  BP: 104/70 100/65 109/71 121/79  Pulse: 94 73 89 80  Temp: 98.1 F (36.7 C) 97.8 F (36.6 C) 98.2 F (36.8 C) 97.8 F (36.6 C)  TempSrc: Oral Oral Oral Oral  Resp: 17 18 18 17   Height:      Weight:      SpO2:  98% 93% 99%    Last BM Date: 03/31/12  Intake/Output   Yesterday:  09/04 0701 - 09/05 0700 In: 1500 [P.O.:1320; I.V.:180] Out: 3025 [Urine:1575; Stool:1450] This shift:     Bowel function:  Flatus:Y  BM: Y, larger volume, liquid  Physical Exam:  General: Pt awake/alert/oriented x4 in no acute distress Eyes: PERRL, normal EOM.  Sclera clear.  No icterus Neuro: CN II-XII intact w/o focal sensory/motor deficits. Lymph: No head/neck/groin lymphadenopathy Psych:  No delerium/psychosis/paranoia HENT: Normocephalic, Mucus membranes moist.  No thrush Neck: Supple, No tracheal deviation Chest: No chest wall pain w good excursion CV:  Pulses intact.  Regular rhythm.  HR 100-110 Abdomen: Soft.  Nondistended.  Ileostomy edematous pink.  + gas/succus.  Wound midline incision clean w fair granulation.  No incarcerated hernias. Ext:  SCDs BLE.  No mjr edema.  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 female  7 Days Post-Op  Procedure(s): EXAM UNDER ANESTHESIA with rectal biopsy Ileocecal resection End  ileostomy  Recurrent rectovaginal fistula s/p ileostomy diversion, ileus resolving  Plan: -adv diet to ileostomy diet -wound care -ileostomy care -B blocker for tachy but watch out for hypotension -anemia -  PO iron -add Neurontin for neuropathic pain.  Consider PO Dilaudid in stead of tramadol -VTE prophylaxis- SCDs, etc -mobilize as tolerated to help recovery - retry once pain controlled -will need & request HH, esp for wound care  Ardeth Sportsman, M.D., F.A.C.S. Gastrointestinal and Minimally Invasive Surgery [facility] [address], [address] [place], [place] [address] [phone] Main / Paging [phone] Voice Mail   04/01/2012  CARE TEAM:  PCP: Allean Found, MD  Outpatient Care Team: Patient Care Team: Candace Darolyn Rua, MD as PCP - General (Family Medicine) Iva Boop, MD as Consulting Physician (Gastroenterology) Ardeth Sportsman, MD (General Surgery) Martina Sinner, MD as Consulting Physician (Urology)  Inpatient Treatment Team: Treatment Team: Attending Provider: Ardeth Sportsman, MD; Registered Nurse: Georgiann Mohs, RN; Registered Nurse: Magda Paganini, RN; Registered Nurse: Laray Anger, RN; Registered Nurse: Mckinley Jewel, RN; Registered Nurse: Patsey Berthold, RN; Technician: Lynden Ang, NT; Registered Nurse: Skipper Cliche, RN; Registered Nurse: Bennetta Laos, RN; Technician: Burnard Bunting, [place]; Registered Nurse: Hewitt Blade, [place]ong [place]; Registered Nurse: Terie Purser, RN   Results:   Labs: No results found for this or any previous visit (from the past 48 hour(s)).  Imaging / Studies: No results found.  Medications / Allergies: per chart  Antibiotics:  Anti-infectives     Start     Dose/Rate Route Frequency Ordered Stop   03/25/12 0915   clindamycin (CLEOCIN) 900 mg, gentamicin (GARAMYCIN) 240 mg in sodium chloride 0.9 % 1,000 mL for  intraperitoneal lavage         Intraperitoneal To Surgery 03/25/12 0909 03/25/12 0925   03/24/12 1100   fluconazole (DIFLUCAN) IVPB 200 mg        200 mg 100 mL/hr over 60 Minutes Intravenous Daily 03/24/12 1011 03/28/12 1054   03/24/12 1100  piperacillin-tazobactam (ZOSYN) IVPB 3.375 g       3.375 g 12.5 mL/hr over 240 Minutes Intravenous Every 8 hours 03/24/12 1039 03/29/12 0957

## 2012-04-02 MED ORDER — TRAMADOL HCL 50 MG PO TABS: 50.0000 mg | ORAL_TABLET | Freq: Four times a day (QID) | ORAL | Status: DC | PRN

## 2012-04-02 MED ORDER — HYOSCYAMINE SULFATE 0.125 MG SL SUBL: 0.1250 mg | SUBLINGUAL_TABLET | SUBLINGUAL | Status: DC | PRN

## 2012-04-02 MED ORDER — GABAPENTIN 100 MG PO CAPS: 200.0000 mg | ORAL_CAPSULE | Freq: Three times a day (TID) | ORAL | Status: DC

## 2012-04-02 NOTE — Consult Note (Signed)
WOC ostomy consult  Stoma type/location:  LLQ, end ileostomy Stomal assessment/size:  1 1/2" round, nicely budded from skin Peristomal assessment: intact with no problems Treatment options for stomal/peristomal skin: none needed Output liquid/some paste green stool Ostomy pouching: 2pc. 2 1/4" wafer cut by pt at 1 3/4" but seems may need to be cut at 1 1/2" at next pouch change, she is independent with ostomy care from previous care for her ileostomy x 2.  Pouch attached to wafer per pt.     Pt reports she will be dc to home tom.   Thanks  Cecilia Nishikawa [employer], [place] [phone]

## 2012-04-02 NOTE — Progress Notes (Signed)
Emily Holloway [id number] 1955-08-18   Subjective:  Walking more Good day yesterday Pain control better w gabapentin added No nausea/vomiting Tolerating solids.  Early satiety.  Trying to nibble throughout day  Objective:  Vital signs:  Filed Vitals:   04/01/12 0839 04/01/12 1400 04/01/12 2123 04/01/12 2219  BP: 138/83 108/74 108/68 103/71  Pulse: 79 92 98 91  Temp: 97.5 F (36.4 C) 98 F (36.7 C)  98.5 F (36.9 C)  TempSrc: Oral Oral  Oral  Resp: 12 16  17   Height:      Weight:      SpO2:  93%  93%    Last BM Date: 04/02/12  Intake/Output   Yesterday:  09/05 0701 - 09/06 0700 In: 320 [P.O.:320] Out: 2775 [Urine:2300; Stool:475] This shift:  Total I/O In: -  Out: 825 [Urine:600; Stool:225]  Bowel function:  Flatus:Y  BM: Y, mod volume, thicker  Physical Exam:  General: Pt awake/alert/oriented x4 in no acute distress Eyes: PERRL, normal EOM.  Sclera clear.  No icterus Neuro: CN II-XII intact w/o focal sensory/motor deficits. Lymph: No head/neck/groin lymphadenopathy Psych:  No delerium/psychosis/paranoia.  Chatty/talkative but pleasant HENT: Normocephalic, Mucus membranes moist.  No thrush Neck: Supple, No tracheal deviation Chest: No chest wall pain w good excursion CV:  Pulses intact.  Regular rhythm.  HR 100-110 Abdomen: Soft.  Nondistended.  Ileostomy edematous pink.  + gas/succus.  Wound midline incision clean w fair granulation.  No incarcerated hernias. Ext:  SCDs BLE.  No mjr edema.  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 female  8 Days Post-Op  Procedure(s): EXAM UNDER ANESTHESIA with rectal  biopsy Ileocecal resection End ileostomy  Recurrent rectovaginal fistula s/p ileostomy diversion,improving  Plan: -ileostomy diet -wound care -ileostomy care -B blocker for tachy but watch out for hypotension -anemia -  PO iron -Pain better controlled w Neurontin for neuropathic pain.  -VTE prophylaxis- SCDs, etc -mobilize as tolerated to help recovery  -HH, esp for wound care  prob OK to d/c later today if continues to improve, maybe over weekend.  I discussed the patient's status to her & continued goals for recovery.  Questions were answered.  They expressed understanding & appreciation.   Ardeth Sportsman, M.D., F.A.C.S. Gastrointestinal and Minimally Invasive Surgery [facility] [address] [place], [place] [address] [phone] Main / Paging [phone] Voice Mail   04/02/2012  CARE TEAM:  PCP: Allean Found, MD  Outpatient Care Team: Patient Care Team: Candace Darolyn Rua, MD as PCP - General (Family Medicine) Iva Boop, MD as Consulting Physician (Gastroenterology) Ardeth Sportsman, MD (General Surgery) Martina Sinner, MD as Consulting Physician (Urology)  Inpatient Treatment Team: Treatment Team: Attending Provider: Ardeth Sportsman, MD; Registered Nurse: Georgiann Mohs, RN; Registered Nurse: Magda Paganini, RN; Registered Nurse: Laray Anger, RN; Registered Nurse: Mckinley Jewel, RN; Registered Nurse: Patsey Berthold, RN; Registered Nurse: Skipper Cliche, RN; Registered Nurse: Bennetta Laos, RN; Technician: Burnard Bunting, [place]; Registered Nurse: Hewitt Blade, [place]; Registered Nurse: Terie Purser, RN; Registered Nurse: Rometta Emery, RN; Respiratory Therapist: Carney Harder, RRT; Registered Nurse: Bonita Quin  Gertie Gowda, RN   Results:   Labs: Results for orders placed during the hospital encounter of 03/24/12 (from the past 48 hour(s))   PREALBUMIN     Status: Abnormal   Collection Time   04/01/12  5:06 AM      Component Value Range Comment   Prealbumin 10.8 (*) 17.0 - 34.0 mg/dL     Imaging / Studies: No results found.  Medications / Allergies: per chart  Antibiotics: Anti-infectives     Start     Dose/Rate Route Frequency Ordered Stop   03/25/12 0915   clindamycin (CLEOCIN) 900 mg, gentamicin (GARAMYCIN) 240 mg in sodium chloride 0.9 % 1,000 mL for intraperitoneal lavage         Intraperitoneal To Surgery 03/25/12 0909 03/25/12 0925   03/24/12 1100   fluconazole (DIFLUCAN) IVPB 200 mg        200 mg 100 mL/hr over 60 Minutes Intravenous Daily 03/24/12 1011 03/28/12 1054   03/24/12 1100  piperacillin-tazobactam (ZOSYN) IVPB 3.375 g       3.375 g 12.5 mL/hr over 240 Minutes Intravenous Every 8 hours 03/24/12 1039 03/29/12 0957

## 2012-04-02 NOTE — Progress Notes (Signed)
Patient prefer to go home tomorrow. Dr. Michaell Cowing called earlier @ office to notify of patient's desire to go home tomorrow. Patient may go home tomorrow.

## 2012-04-03 NOTE — Progress Notes (Signed)
CM spoke with patient concerning dc planning. Per pt choice [facility] to provide [facility] services. Patient's spouse at bedside to assist with dressing changes. Patient comfortable with ostomy care. No other needs stated at this time. [facility] notified of discharge. Md orders faxed to [facility] at [phone].    Leonie Green [phone]

## 2012-04-03 NOTE — Progress Notes (Signed)
General Surgery Note  LOS: 10 days  POD# 10  Assessment/Plan: 1.  EXAM UNDER ANESTHESIA, LAPAROSCOPIC COLOSTOMY TAKEDOWN - S. Gross - 09/25/2011  Wound partially open and clean - she will have [facility] assist in changing dressing  To D/C home today  Instructions reviewed and prescriptions given to pt.   2. 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 Objective:   Filed Vitals:   04/03/12 0537  BP: 109/68  Pulse: 105  Temp: 99 F (37.2 C)  Resp: 18     Intake/Output from previous day:  09/06 0701 - 09/07 0700 In: 240 [P.O.:240] Out: 2925 [Urine:1925; Stool:1000]  Intake/Output this shift:      Physical Exam:   General: Older WF who is alert and oriented.    HEENT: Normal. Pupils equal. .   Lungs: Clear.   Abdomen: Soft.   Wound: Midline wound clean, though tender in superior aspect.  Ileostomy in LLQ.  Stool in bag.   Neurologic:  Grossly intact to motor and sensory function.   Lab Results:   No results found for this basename: WBC:2,HGB:2,HCT:2,PLT:2 in the last 72 hours  BMET  No results found for this basename: NA:2,K:2,CL:2,CO2:2,GLUCOSE:2,BUN:2,CREATININE:2,CALCIUM:2 in the last 72 hours  PT/INR  No results found for this basename: LABPROT:2,INR:2 in the last 72 hours  ABG  No results found for this basename: PHART:2,PCO2:2,PO2:2,HCO3:2 in the last 72 hours   Studies/Results:  No results found.   Anti-infectives:   Anti-infectives     Start     Dose/Rate Route Frequency Ordered Stop   03/25/12 0915   clindamycin (CLEOCIN) 900 mg, gentamicin (GARAMYCIN) 240 mg in sodium chloride 0.9 % 1,000 mL for intraperitoneal lavage         Intraperitoneal To Surgery 03/25/12 0909 03/25/12 0925   03/24/12 1100    fluconazole (DIFLUCAN) IVPB 200 mg        200 mg 100 mL/hr over 60 Minutes Intravenous Daily 03/24/12 1011 03/28/12 1054   03/24/12 1100  piperacillin-tazobactam (ZOSYN) IVPB 3.375 g       3.375 g 12.5 mL/hr over 240 Minutes Intravenous Every 8 hours 03/24/12 1039 03/29/12 0957          Ovidio Kin, MD, FACS Pager: [phone],   [facility] Office: [phone] 04/03/2012

## 2012-04-03 NOTE — Progress Notes (Signed)
Assessment unchanged. HHRN through [facility] arranged by CM. Pt verbalized understanding of dc instructions. Dressing supplies provided as needed. Scripts x3 given as provided by MD. Pt proficient with ostomy care. Pt dc'd via wc to front entrance to meet awaiting vehicle to carry home, accompanied by NT and husband.

## 2012-04-05 NOTE — Discharge Summary (Addendum)
Physician Discharge Summary  Patient ID: Emily Holloway MRN: [id number] DOB/AGE: [birth year] 56 y.o.  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  She underwent urgent expiration.  Resection of her ileocolonic anastomosis which seemed to have breakdown.  Examination under anesthesia with rectal biopsies.  No evidence of cancer recurrence.  Postoperatively, her ileus gradually resolved.  Therapy was involved in helping to mobilize her.  Dressing changes were done.  She has a lot of sensitivity with the dressing changes.  Neuropathic pain.  Not seem to be under better control with gabapentin in addition to narcotics.  She began to have flatus in the bag.  The patient mobilized and advanced to a solid diet gradually.  Pain was well-controlled and transitioned off IV medications.    By the time of discharge, the patient was walking well the hallways, eating food well, having flatus.  Pain was-controlled on an oral regimen.  Based on meeting DC criteria and recovering well, I felt it was safe for the patient to be discharged home with close followup.  Mouth has been arranged for wound care and ileostomy draining.  Also a physical therapy visit.  Her husband is staying off work to help her continue to recover.  Instructions were discussed in detail.  They  are written as well.  Consults: None  Significant Diagnostic Studies:   Treatments: surgery: ileocolic resection & end ileostomy, EUA, rectal biopsy  Discharge Exam: Blood pressure 96/69, pulse 109, temperature 99 F (37.2 C), temperature source Oral, resp. rate 18, height 5\' 2"  (1.575 m), weight 156 lb 11.2 oz (71.079 kg), SpO2 97.00%.  General: Pt awake/alert/oriented x4 in no major acute distress Eyes: PERRL, normal EOM. Sclera nonicteric Neuro: CN II-XII intact w/o focal sensory/motor deficits. Lymph: No head/neck/groin lymphadenopathy Psych:  No delerium/psychosis/paranoia HENT: Normocephalic, Mucus membranes moist.  No thrush Neck: Supple, No tracheal deviation Chest: No pain.  Good respiratory excursion. CV:  Pulses intact.  Regular rhythm Abdomen: Soft, Nondistended.  Min tender at wound only.  Wound granulating.  No incarcerated hernias. Ext:  SCDs BLE.  No significant edema.  No cyanosis Skin: No petechiae / purpurae   Disposition: 01-Home or Self Care  Discharge Orders    Future Orders Please Complete By Expires   Diet - low sodium heart healthy      Diet - low sodium heart healthy      Increase activity slowly      Increase activity slowly        Medication List  As of 04/05/2012  8:08 AM   TAKE these medications         gabapentin 100 MG capsule   Commonly known as: NEURONTIN   Take 2 capsules (200 mg total) by mouth 3 (three) times daily.      hyoscyamine 0.125 MG SL tablet   Commonly known as: LEVSIN SL   Place 0.125 mg under the tongue every 4 (four) hours  as needed.      hyoscyamine 0.125 MG SL tablet   Commonly known as: LEVSIN SL   Place 1 tablet (0.125 mg total) under the tongue every 4 (four) hours as needed for cramping.      promethazine 12.5 MG tablet   Commonly known as: PHENERGAN   Take 12.5-25 mg by mouth every 6 (six) hours as needed. nausea      traMADol 50 MG tablet   Commonly known as: ULTRAM   Take 1-2 tablets (50-100 mg total) by  mouth every 6 (six) hours as needed. For pain           Follow-up Information    Follow up with Marya Lowden C., MD. Schedule an appointment as soon as possible for a visit in 1 week.   Contact information:   [address] [place] [address] [phone]          Signed: Ardeth Sportsman. 04/05/2012, 8:08 AM

## 2012-04-06 DIAGNOSIS — D638 Anemia in other chronic diseases classified elsewhere: Secondary | ICD-10-CM | POA: Diagnosis present

## 2012-04-14 ENCOUNTER — Encounter (INDEPENDENT_AMBULATORY_CARE_PROVIDER_SITE_OTHER): Payer: Self-pay | Admitting: Surgery

## 2012-04-14 ENCOUNTER — Ambulatory Visit (INDEPENDENT_AMBULATORY_CARE_PROVIDER_SITE_OTHER): Payer: 59 | Admitting: Surgery

## 2012-04-14 VITALS — BP 110/76 | HR 100 | Temp 98.3°F | Ht 62.0 in | Wt 139.6 lb

## 2012-04-14 DIAGNOSIS — N823 Fistula of vagina to large intestine: Secondary | ICD-10-CM

## 2012-04-14 DIAGNOSIS — T2121XA Burn of second degree of chest wall, initial encounter: Secondary | ICD-10-CM

## 2012-04-14 DIAGNOSIS — T8131XA Disruption of external operation (surgical) wound, not elsewhere classified, initial encounter: Secondary | ICD-10-CM | POA: Insufficient documentation

## 2012-04-14 DIAGNOSIS — N824 Other female intestinal-genital tract fistulae: Secondary | ICD-10-CM

## 2012-04-14 DIAGNOSIS — D638 Anemia in other chronic diseases classified elsewhere: Secondary | ICD-10-CM

## 2012-04-14 DIAGNOSIS — S31109A Unspecified open wound of abdominal wall, unspecified quadrant without penetration into peritoneal cavity, initial encounter: Secondary | ICD-10-CM

## 2012-04-14 DIAGNOSIS — Z932 Ileostomy status: Secondary | ICD-10-CM

## 2012-04-14 DIAGNOSIS — Z85048 Personal history of other malignant neoplasm of rectum, rectosigmoid junction, and anus: Secondary | ICD-10-CM

## 2012-04-14 HISTORY — DX: Burn of second degree of chest wall, initial encounter: T21.21XA

## 2012-04-14 LAB — CBC WITH DIFFERENTIAL/PLATELET
Basophils Absolute: 0.1 10*3/uL (ref 0.0–0.1)
Eosinophils Relative: 10 % — ABNORMAL HIGH (ref 0–5)
Lymphocytes Relative: 23 % (ref 12–46)
MCV: 87.1 fL (ref 78.0–100.0)
Neutrophils Relative %: 53 % (ref 43–77)
Platelets: 788 10*3/uL — ABNORMAL HIGH (ref 150–400)
RBC: 3.81 MIL/uL — ABNORMAL LOW (ref 3.87–5.11)
RDW: 14.6 % (ref 11.5–15.5)
WBC: 6.2 10*3/uL (ref 4.0–10.5)

## 2012-04-14 LAB — BASIC METABOLIC PANEL
BUN: 11 mg/dL (ref 6–23)
Calcium: 9.7 mg/dL (ref 8.4–10.5)
Creat: 0.66 mg/dL (ref 0.50–1.10)
Glucose, Bld: 90 mg/dL (ref 70–99)
Potassium: 4.7 mEq/L (ref 3.5–5.3)

## 2012-04-14 LAB — ALBUMIN: Albumin: 3.7 g/dL (ref 3.5–5.2)

## 2012-04-14 MED ORDER — FERROUS SULFATE 325 (65 FE) MG PO TABS: 325.0000 mg | ORAL_TABLET | Freq: Three times a day (TID) | ORAL | Status: DC

## 2012-04-14 NOTE — Patient Instructions (Addendum)
Burn Care Your skin is a natural barrier to infection. It is the largest organ of your body. Burns damage this natural protection. To help prevent infection, it is very important to follow your caregiver's instructions in the care of your burn. Burns are classified as:  First degree. There is only redness of the skin (erythema). No scarring is expected.   Second degree. There is blistering of the skin. Scarring may occur with deeper burns.   Third degree. All layers of the skin are injured, and scarring is expected.  HOME CARE INSTRUCTIONS   Wash your hands well before changing your bandage.   Change your bandage as often as directed by your caregiver.   Remove the old bandage. If the bandage sticks, you may soak it off with cool, clean water.   Cleanse the burn thoroughly but gently with mild soap and water.   Pat the area dry with a clean, dry cloth.   Apply a thin layer of antibacterial cream to the burn.   Apply a clean bandage as instructed by your caregiver.   Keep the bandage as clean and dry as possible.   Elevate the affected area for the first 24 hours, then as instructed by your caregiver.   Only take over-the-counter or prescription medicines for pain, discomfort, or fever as directed by your caregiver.  SEEK IMMEDIATE MEDICAL CARE IF:   You develop excessive pain.   You develop redness, tenderness, swelling, or red streaks near the burn.   The burned area develops yellowish-white fluid (pus) or a bad smell.   You have a fever.  MAKE SURE YOU:   Understand these instructions.   Will watch your condition.   Will get help right away if you are not doing well or get worse.  Document Released: 07/14/2005 Document Revised: 07/03/2011 Document Reviewed: 12/04/2010 [facility] Patient Information 2012 [place], [place].  Ileostomy Home Guide An ileostomy is an opening for stool to leave your body when a medical condition prevents it from leaving through the usual  opening (rectum). During a surgery, a piece of small intestine (ileum) is brought through a hole in the abdominal wall. The new opening is called a stoma or ostomy. A bag or pouch fits over the stoma to catch stool and gas. Your stool may be liquid at first. Over time, it may become about the consistency of applesauce. CARING FOR YOUR STOMA Normally, the stoma looks a lot like the inside of your cheek: pink and moist. At first, it may be swollen, but this swelling will decrease within 6 weeks. Keep the skin around the stoma clean and dry. You can gently wash your stoma in the shower with a clean, soft washcloth. If you develop any skin irritation, your caregiver may give you a stoma powder or ointment to help heal the area. Do not use any products other than those specifically given to you by your caregiver.  Your stoma should not be uncomfortable. If you notice any stinging or burning, your pouch may be leaking, and the skin around your stoma may be coming into contact with stool. This can cause skin irritation. If you notice stinging, you should replace your pouch with a new one and discard the old one. OSTOMY POUCHES The pouch that fits over the ostomy can be made up of either 1 or 2 pieces. A one-piece pouch has a skin barrier piece and the pouch itself in one unit. A two-piece pouch has a skin barrier with a separate pouch that  snaps on and off of the skin barrier. Either way, you should empty the pouch when it is only ? to  full. Do not let more stool or gas build up. This could cause the pouch to leak. Some ostomy bags have a built-in gas release valve. Ostomy deodorizer (5 drops) can be put into the pouch to prevent odor. Some people use an ostomy lubricant to help the stool slide out of the bag more easily and completely.  EMPTYING YOUR OSTOMY POUCH You may get lessons on how to empty your pouch from a wound-ostomy nurse before you leave the hospital. Here are the basic steps:  Wash your hands  with soap and water.   Sit far back on the toilet.   Put pieces of toilet paper into the toilet water. This will prevent splashing as you empty the stool into the toilet bowl.   Unclip or unvelcro the tail end of the pouch.   Unroll the tail and empty stool into the toilet.   Clean the tail with toilet paper.   Reroll the tail, and clip or velcro it closed.   Wash your hands again.  CHANGING YOUR OSTOMY POUCH Change your ostomy pouch about every 3 to 4 days for the first 6 weeks, then every 5 to 7 days. Always change the bag sooner if you begin to notice any discomfort or irritation of the skin around the stoma. When possible, plan to change your ostomy pouching system before eating and drinking as this will lessen the chance of stool coming out during the change. A wound-ostomy nurse may teach you how to change your pouch before you leave the hospital. Here are the basic steps:  Lay out your supplies.   Wash your hands with soap and water.   Carefully remove the old pouch.   Wash the stoma and skin around the stoma and allow it to dry. Men may be advised to shave any hair around the stoma very carefully. This will make the adhesive stick better.   Use the stoma measuring guide that comes with your pouch set to decide what size hole you will need to cut in the skin barrier piece. Choose the smallest possible size that will hold the stoma but will not touch it.   Use the guide to trace the circle on the back of the skin barrier piece. Cut out the hole.   Hold the skin barrier piece over the stoma to make sure the hole is the correct size.   Remove the adhesive paper backing from the skin barrier piece.   Squeeze stoma paste around the opening of the skin barrier piece.   Clean and dry the skin around the stoma again.   Carefully fit the skin barrier piece over your stoma.   If you are using a two-piece pouch, snap the pouch onto the skin barrier piece.   Close the tail of the  pouch.   Put your hand over the top of the skin barrier piece to help warm it for about 5 minutes, so that it conforms to your body better.   Wash your hands again.  DIET TIPS Because you have a higher risk of a blockage in the first 2 months after getting an ileostomy, you should decrease your fiber intake for that time period. Fibrous foods include:  Celery.   Cabbage (and coleslaw).   Pineapple.   Mushrooms.   Corn and popcorn.   Whole fruits and vegetables, especially with the skins on.  Foods that contain seeds.   Oranges, grapefruit, tangerines, and other citrus fruit.   Nuts.  After about 2 months, you can slowly add these kinds of foods back into your regular diet, as tolerated. Other tips:  Drink about eight 8 oz glasses of water each day.   You can prevent gas by eating slowly and chewing your food thoroughly.   If you feel concerned that you have too much gas, you can cut back on gas-producing foods, such as:   Spicy foods.   Onions and garlic.   Cruciferous vegetables (cabbage, broccoli, cauliflower, Brussels sprouts).   Beans and legumes.   Some cheeses.   Eggs.   Fish.   Bubbly (carbonated) drinks.   Chewing gum.  GENERAL TIPS  You can shower with or without the bag in place.   Always keep the bag snapped on if you are bathing or swimming.   If your bag gets wet, you can dry it with a blow-dryer set to cool.   Avoid wearing tight clothing directly over your stoma so that it does not become irritated or bleed. Tight clothing can also prevent the stool from draining into the pouch which can cause it to leak.   It is helpful to always have an extra skin barrier and pouch with you when traveling. Do not leave them anywhere too warm, as parts of them can melt.   Do not let your seat belt rest on your stoma. Try to keep the seat belt either above or below your stoma, or use a tiny pillow to cushion it.   You can still participate in sports,  but you should avoid activities in which there is a risk of getting hit in the abdomen.   You can still have sex. It is a good idea to empty your pouch prior to sex. Some people and their partners feel very comfortable seeing the pouch during sex. Others choose to wear lingerie or a T-shirt that covers the device.  SEEK IMMEDIATE MEDICAL CARE IF:  You notice a change in the size or color of the stoma, especially if it becomes very red, purple, black, or pale white.   You have bloody stools or bleeding from the ostomy.   You have abdominal pain, nausea, vomiting, or bloating.   There is anything unusual protruding from the ostomy.   You have irritation or red skin around the ostomy.   No stool is passing from the stoma.   You have diarrhea (requiring more frequent than normal pouch emptying).  Document Released: 07/17/2003 Document Revised: 07/03/2011 Document Reviewed: 12/11/2010 [facility] Patient Information 2012 [place], [place].

## 2012-04-14 NOTE — Progress Notes (Signed)
Subjective:     Patient ID: Emily Holloway, female   DOB: 03/18/1956, 56 y.o.   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   FINAL DIAGNOSIS Diagnosis 1. 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 2. Small intestine, resection, ileo cecum - ACUTE ULCER. - CHRONIC ACTIVE ILEITIS AND COLITIS. - NO GRANULOMA OR DYSPLASIA. - FOUR LYMPH NODES, NEGATIVE FOR NEOPLASM. - RESECTION MARGINS ARE VIABLE. - APPENDICEAL TISSUE WITH CHRONIC ACTIVE APPENDICITIS. Abigail Miyamoto MD Pathologist, Electronic Signature (Case signed 03/26/2012)  Patient comes in today feeling better.  Vaginal drainage going down.  Emptying ileostomy six times a day without help Of any antidiarrheals.  No skin problems..  Noticed to have tachycardia at 100-120s at times.  No hypertension.  No lightheadedness or dizziness.  Appetite better.  Urinating fine.  More active.  Came in on her own.Getting daily dressing changes.  Most sensitive area.  Due to switch over to hydrocell three times a week dressing changes.  Noticed some blisters on her right lateral breast.  She thinks it happened after she fell asleep on a heating pad to treat some right flank pain.  Energy level improving.  No drainage.  No history of breast problems.  No history of nipple discharge or skin changes.  Last mammogram within the past year and was normal.  Patient Active  Problem List  Diagnosis  . 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 History of rectal cancer, T2N0, Nov2010  . IBS (irritable bowel syndrome)  . 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 Anemia in chronic illness  . Open abdominal incision     Past Medical History  Diagnosis Date  . Rectovaginal fistula     recurrent  . Rectal carcinoma     T2N0 s/p lap LAR with coloanal anastomosis  . Parastomal hernia s/p primary repair Aug2013 03/10/2012    Past Surgical History  Procedure Date  . Colon resection 2010    Rectal cancer s/p lap LAR/coloanal anastomosis  . Ostomy placement 2011    with RV fistula repair  . Tubal ligation   . Rectovaginal fistula closure [id number], [id number]  . Colonoscopy w/ biopsies and polypectomy 04/09/2009    adenocarcinoma  . Recto-vaginal fissure repair 10/23/2011    Procedure: REPAIR RECTO-VAGINAL FISTULA;  Surgeon: Ardeth Sportsman, MD;  Location: [facility];  Service: General;  Laterality: N/A;  Repair of Rectovaginal Fistula with Pedicle Martius Flap with Dr Lorin Picket MacDiarmid to co-surgeon   . Ileostomy closure 03/05/2012    Procedure: ILEOSTOMY TAKEDOWN;  Surgeon: Ardeth Sportsman, MD;  Location: [facility];  Service: General;  Laterality: N/A;  . Parastomal hernia repair 03/05/2012    Procedure: HERNIA REPAIR PARASTOMAL;  Surgeon: Ardeth Sportsman, MD;  Location: [facility];  Service: General;  Laterality: N/A;  . Examination under anesthesia 03/25/2012    Procedure: EXAM UNDER ANESTHESIA;  Surgeon: Ardeth Sportsman, MD;  Location: [facility];  Service: General;  Laterality: N/A;  recto vaginal fistula biopsy  . Colostomy takedown  03/25/2012    Procedure: LAPAROSCOPIC COLOSTOMY TAKEDOWN;  Surgeon: Ardeth Sportsman, MD;  Location: [facility];  Service: General;  Laterality: N/A;  diagnostic laparoscopy,ileocecectomy, creation of colostomy    History   Social History  . Marital Status: Married    Spouse Name: N/A    Number of Children: N/A  . Years of Education:  N/A   Occupational History  . Not on file.   Social History Main Topics  . Smoking status: Former Smoker -- 1.0 packs/day for 35 years    Types: Cigarettes    Quit date: 03/24/2009  . Smokeless tobacco: Never Used   Comment: 2010 QUIT SMOKING  . Alcohol Use: No  . Drug Use: No  . Sexually Active: No   Other Topics Concern  . Not on file   Social History Narrative  . No narrative on file    Family History  Problem Relation Age of Onset  . Hypertension Mother   . Cancer Father     lung  . Diabetes Father   . Cancer Brother     bladder    Current Outpatient Prescriptions  Medication Sig Dispense Refill  . gabapentin (NEURONTIN) 100 MG capsule Take 2 capsules (200 mg total) by mouth 3 (three) times daily.  60 capsule  3  . traMADol (ULTRAM) 50 MG tablet Take 1-2 tablets (50-100 mg total) by mouth every 6 (six) hours as needed. For pain  60 tablet  0  . hyoscyamine (LEVSIN SL) 0.125 MG SL tablet Place 0.125 mg under the tongue every 4 (four) hours as needed.      . hyoscyamine (LEVSIN/SL) 0.125 MG SL tablet Place 1 tablet (0.125 mg total) under the tongue every 4 (four) hours as needed for cramping.  30 tablet  0  . promethazine (PHENERGAN) 12.5 MG tablet Take 12.5-25 mg by mouth every 6 (six) hours as needed. nausea         Allergies  Allergen Reactions  . Oxycodone Nausea Only  . Sulfonamide Derivatives     REACTION: itching    BP 110/76  Pulse 100  Temp 98.3 F (36.8 C) (Temporal)  Ht 5\' 2"  (1.575 m)  Wt 139 lb 9.6 oz (63.322 kg)  BMI 25.53 kg/m2  SpO2 95%  Ct Abdomen Pelvis W Contrast  03/23/2012  *RADIOLOGY REPORT*  Clinical Data: Evaluate for vaginal fistula  CT ABDOMEN AND PELVIS WITH CONTRAST  Technique:  Multidetector CT imaging of the abdomen and pelvis was performed following the standard protocol during bolus administration of intravenous contrast.  Contrast: OMNIPAQUE IOHEXOL 300 MG/ML  SOLN  Comparison: 07/11/2012  Findings: The lung bases are  clear.  No pericardial or pleural effusion.  There is no focal liver abnormality.  Small amount of pericholecystic fluid is identified.  There is no significant biliary dilatation.  The pancreas is normal.  The spleen is within normal limits.  Both adrenal glands are normal.  The right kidney is normal.  The left kidney is normal.  Small amount of gas is noted within the lumen of the gallbladder. There is high-density enteric contrast material identified within the dome of the vagina.  Findings are concerning for colonic vaginal fistula.  Uterus appears normal.  Right posterior adnexal cyst measures 4.5 cm, image number 67.  Unchanged from previous exam.  Decrease in size of left adnexal cystic structure.  Right lower quadrant ileocolic lymph node measures 1 cm, image 49. This is compared with 0.7 cm previously.  No pelvic or  inguinal adenopathy.  The stomach appears normal.  There has been reversal of previous right lower quadrant double-barreled ileostomy. Enteroenteric anastomosis has been performed. At the anastomotic site there is a heterogeneous and centrally low density mass which measures approximately 2.6 x 5.6 cm, image 34. The distal small bowel loops are abnormal in appearance with evidence of mucosal enhancement and mural edema.  Wall thickening of the distal small bowel measures up to 10 mm, image 42.  Wall thickening and inflammatory changes involving the entire colon from the cecum to the rectum is also noticed.  No evidence for colonic perforation or abscess formation.  There is a drainage catheter which is situated in the right lower quadrant ventral abdominal wall.  Review of the visualized bony structures is unremarkable.  IMPRESSION:  1.  Examination is positive for rectovaginal fistula.  Enteric contrast material can be seen within the dome of the vagina.  2.  Abnormal wall thickening and inflammatory changes involving the distal small bowel consistent with enteritis. 3. At the enteroenteric  anastomosis there is an abnormal and heterogeneous mass. This may represent a phlegmon formation. No discrete drainable abscess is noted.  Underlying tumor would be difficult to exclude and close interval follow-up is recommended. 4.  Pan colitis. 5.  Stable cystic structure within the right posterior pelvis. Interval improvement in the left ovarian cyst.   Original Report Authenticated By: Rosealee Albee, M.D.      Review of Systems  Constitutional: Negative for fever, chills and diaphoresis.  HENT: Negative for ear pain, sore throat and trouble swallowing.   Eyes: Negative for photophobia and visual disturbance.  Respiratory: Negative for cough and choking.   Cardiovascular: Negative for chest pain and palpitations.  Gastrointestinal: Negative for nausea, vomiting, abdominal pain, diarrhea, constipation, anal bleeding and rectal pain.  Genitourinary: Negative for dysuria, frequency and difficulty urinating.  Musculoskeletal: Negative for myalgias and gait problem.  Skin: Negative for color change, pallor and rash.  Neurological: Negative for dizziness, speech difficulty, weakness and numbness.  Hematological: Negative for adenopathy.  Psychiatric/Behavioral: Negative for confusion and agitation. The patient is not nervous/anxious.        Objective:   Physical Exam  Constitutional: She is oriented to person, place, and time. She appears well-developed and well-nourished. No distress.  HENT:  Head: Normocephalic.  Mouth/Throat: Oropharynx is clear and moist. No oropharyngeal exudate.  Eyes: Conjunctivae normal and EOM are normal. Pupils are equal, round, and reactive to light. No scleral icterus.  Neck: Normal range of motion. No tracheal deviation present.  Cardiovascular: Normal rate and intact distal pulses.   Pulmonary/Chest: Effort normal. No respiratory distress. She exhibits no tenderness. Right breast exhibits skin change. Right breast exhibits no inverted nipple, no mass, no  nipple discharge and no tenderness. Left breast exhibits no inverted nipple, no mass, no nipple discharge, no skin change and no tenderness.    Abdominal: Soft. She exhibits no distension. There is no tenderness. Hernia confirmed negative in the right inguinal area and confirmed negative in the left inguinal area.         Incisions clean with normal healing ridges.  No hernias  Genitourinary: No vaginal discharge found.  Musculoskeletal: Normal range of motion. She exhibits no tenderness.  Lymphadenopathy:       Right: No inguinal adenopathy present.       Left: No inguinal adenopathy present.  Neurological: She is alert and oriented to person, place, and time. No cranial nerve deficit. She exhibits normal muscle tone.  Coordination normal.  Skin: Skin is warm and dry. No rash noted. She is not diaphoretic.  Psychiatric: She has a normal mood and affect. Her behavior is normal.       Assessment:     Recurrent rectovaginal fistula despite Mardis flap buttress him.  Now with end ileostomy  Open midline wound granulating  Blisters on right lateral breast with history of heating pad.  Probable contact burn.     Plan:     Increase activity as tolerated.  Do not push through pain.  Advanced on diet as tolerated. Bowel regimen to avoid problems.Imodium when necessary.  Anemia.  Iron.  Check labs.  I had to help constipate her as well.  Checks collect right labs a tachycardia to make sure she's not dehydrated.  Continue wound care.  Okay to switch to hydrogel three times a week dressing changes.  Tachycardia of uncertain etiology.  Not particularly anxious.  Not orthostatic.  Doubt dehydration but will check labs.  I am hesitant to start her back on metoprolol with her ileostomy and lack of hypertension.  May have to discuss with her primary care physician.  Monitor now and see if improves his anemia improves and she feels better.  Blisters on right lateral breast probable partial  thickness skin burns.  Should resolve on their own.  Needs to be followed.  If worsens, may need punch biopsy or evaluation by one of my breast surgical colleagues.  We'll continue to monitor.  Hold off on vaginal examination the soon since clinically improving.  If repair is needed to be done, would probably require proctectomy with new coloanal anastomosis and probable gracilis muscle flap reinforcement on the rectal side since it's the area of breakdown.  Beyond the scope of what I can do at this moment.  I have discussed with our new colorectal partner.  I have discussed with Dr. Kelly Splinter with reconstructive surgery as well.  She may be able to provide rotational flap coverage later versus sending two major academic institution.  Another option is permanent ileostomy.  Return to clinic 2 weeks to follow wound. The patient expressed understanding and appreciation

## 2012-04-15 ENCOUNTER — Telehealth (INDEPENDENT_AMBULATORY_CARE_PROVIDER_SITE_OTHER): Payer: Self-pay

## 2012-04-15 NOTE — Telephone Encounter (Signed)
Called Emily Holloway to notify her that her labs drawn yesterday are within normal limits but showing mildly anemia. Emily Holloway advised to start the iron supplement that Dr Michaell Cowing prescribed thru e-file. The Emily Holloway understands.

## 2012-04-19 ENCOUNTER — Telehealth (INDEPENDENT_AMBULATORY_CARE_PROVIDER_SITE_OTHER): Payer: Self-pay

## 2012-04-19 ENCOUNTER — Observation Stay (HOSPITAL_COMMUNITY): Payer: 59

## 2012-04-19 ENCOUNTER — Other Ambulatory Visit (INDEPENDENT_AMBULATORY_CARE_PROVIDER_SITE_OTHER): Payer: Self-pay | Admitting: Surgery

## 2012-04-19 ENCOUNTER — Encounter (HOSPITAL_COMMUNITY): Payer: Self-pay

## 2012-04-19 ENCOUNTER — Inpatient Hospital Stay (HOSPITAL_COMMUNITY)
Admission: AD | Admit: 2012-04-19 | Discharge: 2012-04-21 | DRG: 760 | Disposition: A | Discharge status: Home / Self Care | Payer: 59 | Source: Ambulatory Visit | Attending: Surgery | Admitting: Surgery

## 2012-04-19 DIAGNOSIS — R000 Tachycardia, unspecified: Secondary | ICD-10-CM | POA: Diagnosis present

## 2012-04-19 DIAGNOSIS — R112 Nausea with vomiting, unspecified: Secondary | ICD-10-CM | POA: Diagnosis present

## 2012-04-19 DIAGNOSIS — N823 Fistula of vagina to large intestine: Secondary | ICD-10-CM

## 2012-04-19 DIAGNOSIS — R109 Unspecified abdominal pain: Secondary | ICD-10-CM

## 2012-04-19 DIAGNOSIS — K589 Irritable bowel syndrome without diarrhea: Secondary | ICD-10-CM

## 2012-04-19 DIAGNOSIS — N390 Urinary tract infection, site not specified: Secondary | ICD-10-CM | POA: Diagnosis present

## 2012-04-19 DIAGNOSIS — T8131XA Disruption of external operation (surgical) wound, not elsewhere classified, initial encounter: Secondary | ICD-10-CM | POA: Diagnosis present

## 2012-04-19 DIAGNOSIS — Z932 Ileostomy status: Secondary | ICD-10-CM

## 2012-04-19 DIAGNOSIS — T2121XA Burn of second degree of chest wall, initial encounter: Secondary | ICD-10-CM | POA: Diagnosis present

## 2012-04-19 DIAGNOSIS — Z85048 Personal history of other malignant neoplasm of rectum, rectosigmoid junction, and anus: Secondary | ICD-10-CM | POA: Diagnosis present

## 2012-04-19 DIAGNOSIS — N829 Female genital tract fistula, unspecified: Principal | ICD-10-CM | POA: Diagnosis present

## 2012-04-19 DIAGNOSIS — F3289 Other specified depressive episodes: Secondary | ICD-10-CM | POA: Diagnosis present

## 2012-04-19 DIAGNOSIS — D638 Anemia in other chronic diseases classified elsewhere: Secondary | ICD-10-CM | POA: Diagnosis present

## 2012-04-19 DIAGNOSIS — Z87891 Personal history of nicotine dependence: Secondary | ICD-10-CM

## 2012-04-19 DIAGNOSIS — Z9889 Other specified postprocedural states: Secondary | ICD-10-CM

## 2012-04-19 DIAGNOSIS — T81321A Disruption or dehiscence of closure of internal operation (surgical) wound of abdominal wall muscle or fascia, initial encounter: Secondary | ICD-10-CM | POA: Diagnosis present

## 2012-04-19 DIAGNOSIS — X088XXA Exposure to other specified smoke, fire and flames, initial encounter: Secondary | ICD-10-CM | POA: Diagnosis present

## 2012-04-19 DIAGNOSIS — F329 Major depressive disorder, single episode, unspecified: Secondary | ICD-10-CM | POA: Diagnosis present

## 2012-04-19 LAB — URINALYSIS, ROUTINE W REFLEX MICROSCOPIC
Bilirubin Urine: NEGATIVE
Glucose, UA: NEGATIVE mg/dL
Protein, ur: NEGATIVE mg/dL
Urobilinogen, UA: 0.2 mg/dL (ref 0.0–1.0)

## 2012-04-19 LAB — COMPREHENSIVE METABOLIC PANEL
ALT: 103 U/L — ABNORMAL HIGH (ref 0–35)
AST: 79 U/L — ABNORMAL HIGH (ref 0–37)
Albumin: 3.8 g/dL (ref 3.5–5.2)
Alkaline Phosphatase: 149 U/L — ABNORMAL HIGH (ref 39–117)
CO2: 24 mEq/L (ref 19–32)
Chloride: 96 mEq/L (ref 96–112)
GFR calc non Af Amer: >90 mL/min (ref >90)
Potassium: 3.9 mEq/L (ref 3.5–5.1)
Sodium: 135 mEq/L (ref 135–145)
Total Bilirubin: 0.4 mg/dL (ref 0.3–1.2)

## 2012-04-19 LAB — CBC
MCV: 88.8 fL (ref 78.0–100.0)
Platelets: 736 10*3/uL — ABNORMAL HIGH (ref 150–400)
RBC: 4.63 MIL/uL (ref 3.87–5.11)
RDW: 14.8 % (ref 11.5–15.5)
WBC: 7.7 10*3/uL (ref 4.0–10.5)

## 2012-04-19 LAB — URINE MICROSCOPIC-ADD ON

## 2012-04-19 MED ORDER — LIP MEDEX EX OINT
1.0000 "application " | TOPICAL_OINTMENT | Freq: Two times a day (BID) | CUTANEOUS | Status: DC
  Administered 2012-04-19 – 2012-04-21 (×5): 1 via TOPICAL

## 2012-04-19 MED ORDER — MAGIC MOUTHWASH
15.0000 mL | Freq: Four times a day (QID) | ORAL | Status: DC | PRN
  Filled 2012-04-19: qty 15

## 2012-04-19 MED ORDER — ONDANSETRON HCL 4 MG/2ML IJ SOLN: 4.0000 mg | Freq: Four times a day (QID) | INTRAMUSCULAR | Status: DC | PRN

## 2012-04-19 MED ORDER — DIPHENHYDRAMINE HCL 12.5 MG/5ML PO ELIX: 12.5000 mg | ORAL_SOLUTION | Freq: Four times a day (QID) | ORAL | Status: DC | PRN

## 2012-04-19 MED ORDER — DEXTROSE IN LACTATED RINGERS 5 % IV SOLN
INTRAVENOUS | Status: DC
  Administered 2012-04-19 – 2012-04-20 (×3): via INTRAVENOUS

## 2012-04-19 MED ORDER — TRAMADOL HCL 50 MG PO TABS
50.0000 mg | ORAL_TABLET | Freq: Four times a day (QID) | ORAL | Status: DC | PRN
  Administered 2012-04-20 – 2012-04-21 (×3): 50 mg via ORAL
  Filled 2012-04-19 (×3): qty 1

## 2012-04-19 MED ORDER — ALUM & MAG HYDROXIDE-SIMETH 200-200-20 MG/5ML PO SUSP: 30.0000 mL | Freq: Four times a day (QID) | ORAL | Status: DC | PRN

## 2012-04-19 MED ORDER — LACTATED RINGERS IV BOLUS (SEPSIS): 1000.0000 mL | Freq: Three times a day (TID) | INTRAVENOUS | Status: DC | PRN

## 2012-04-19 MED ORDER — HYDROMORPHONE HCL PF 1 MG/ML IJ SOLN: 0.5000 mg | INTRAMUSCULAR | Status: DC | PRN

## 2012-04-19 MED ORDER — DIPHENHYDRAMINE HCL 50 MG/ML IJ SOLN: 12.5000 mg | Freq: Four times a day (QID) | INTRAMUSCULAR | Status: DC | PRN

## 2012-04-19 MED ORDER — PROMETHAZINE HCL 25 MG/ML IJ SOLN
12.5000 mg | Freq: Four times a day (QID) | INTRAMUSCULAR | Status: DC | PRN
Start: 2012-04-19 — End: 2012-04-21
  Administered 2012-04-19: 25 mg via INTRAVENOUS
  Filled 2012-04-19: qty 1

## 2012-04-19 MED ORDER — BISMUTH SUBSALICYLATE 262 MG/15ML PO SUSP
30.0000 mL | Freq: Three times a day (TID) | ORAL | Status: DC | PRN
  Administered 2012-04-19: 30 mL via ORAL
  Filled 2012-04-19: qty 236

## 2012-04-19 MED ORDER — ACETAMINOPHEN 325 MG PO TABS: 650.0000 mg | ORAL_TABLET | Freq: Four times a day (QID) | ORAL | Status: DC | PRN

## 2012-04-19 MED ORDER — ACETAMINOPHEN 650 MG RE SUPP: 650.0000 mg | Freq: Four times a day (QID) | RECTAL | Status: DC | PRN

## 2012-04-19 MED ORDER — LACTATED RINGERS IV BOLUS (SEPSIS)
1000.0000 mL | Freq: Once | INTRAVENOUS | Status: AC
  Administered 2012-04-19: 1000 mL via INTRAVENOUS

## 2012-04-19 MED ORDER — PSYLLIUM 95 % PO PACK
1.0000 | PACK | Freq: Every day | ORAL | Status: DC
  Filled 2012-04-19 (×2): qty 1

## 2012-04-19 MED ORDER — GABAPENTIN 100 MG PO CAPS
200.0000 mg | ORAL_CAPSULE | Freq: Three times a day (TID) | ORAL | Status: DC | PRN
  Filled 2012-04-19: qty 2

## 2012-04-19 MED ORDER — INFLUENZA VIRUS VACC SPLIT PF IM SUSP
0.5000 mL | INTRAMUSCULAR | Status: AC
  Administered 2012-04-20: 0.5 mL via INTRAMUSCULAR
  Filled 2012-04-19: qty 0.5

## 2012-04-19 MED ORDER — SACCHAROMYCES BOULARDII 250 MG PO CAPS
250.0000 mg | ORAL_CAPSULE | Freq: Two times a day (BID) | ORAL | Status: DC
  Administered 2012-04-19 – 2012-04-21 (×4): 250 mg via ORAL
  Filled 2012-04-19 (×6): qty 1

## 2012-04-19 MED ORDER — DIPHENHYDRAMINE HCL 50 MG/ML IJ SOLN
12.5000 mg | Freq: Four times a day (QID) | INTRAMUSCULAR | Status: DC | PRN
  Administered 2012-04-20: 25 mg via INTRAVENOUS
  Filled 2012-04-19: qty 1

## 2012-04-19 MED ORDER — PSYLLIUM 95 % PO PACK
1.0000 | PACK | Freq: Two times a day (BID) | ORAL | Status: DC
  Filled 2012-04-19 (×2): qty 1

## 2012-04-19 NOTE — Progress Notes (Deleted)
Emily Holloway  08/09/1955 [id number]  CARE TEAM:  PCP: SMITH,CANDACE THIELE, MD  Outpatient Care Team: Patient Care Team: Candace Thiele Smith, MD as PCP - General (Family Medicine) Carl E Gessner, MD as Consulting Physician (Gastroenterology) Teara Duerksen C. Percell Lamboy, MD (General Surgery) Scott A MacDiarmid, MD as Consulting Physician (Urology)  Inpatient Treatment Team: Treatment Team: Attending Provider: Yareth Macdonnell C. Susan Arana, MD   This patient is a 56 y.o.female who presents today for surgical evaluation.   Pleasant woman with the recurrent rectovaginal fistula despite repairs.  It is about three weeks out from and ileostomy and resection of abscess with anastomotic leak.  I saw her last week and she was eating better.  Feeling stronger.  Yesterday morning she had severe nausea vomiting.  Persisted through the night.  Called plane.  I recommended studies.  Her husband convinced her to consider admission since she was feeling dehydrated.  I agreed  She denies sick contacts.  Some bleeding around the anus and mainly vagina.  Tapering off now.  No major lightheadedness or dizziness.  Crampy pain.  Some mild discomfort otherwise.Passing gas and liquid affluent out the ileostomy.  Not markedly worse.  Not taking much in the way of antidiarrheals.  Needing tramadol narcotic pain control rarely.  Patient Active Problem List  Diagnosis  . 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 History of rectal cancer, T2N0, Nov2010  . IBS (irritable bowel syndrome)  . 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 Anemia in chronic illness  . Open abdominal incision   . Blisters with epidermal loss due to burn (second degree) of right lateral breast    Past Medical History  Diagnosis Date  . Rectovaginal fistula     recurrent  . Rectal carcinoma     T2N0 s/p lap LAR with coloanal anastomosis  . Parastomal hernia s/p primary repair Aug2013 03/10/2012    Past Surgical History  Procedure  Date  . Colon resection 2010    Rectal cancer s/p lap LAR/coloanal anastomosis  . Ostomy placement 2011    with RV fistula repair  . Tubal ligation   . Rectovaginal fistula closure dec2011, may2012  . Colonoscopy w/ biopsies and polypectomy 04/09/2009    adenocarcinoma  . Recto-vaginal fissure repair 10/23/2011    Procedure: REPAIR RECTO-VAGINAL FISTULA;  Surgeon: Runa Whittingham C. Miranda Garber, MD;  Location: [facility];  Service: General;  Laterality: N/A;  Repair of Rectovaginal Fistula with Pedicle Martius Flap with Dr Scott MacDiarmid to co-surgeon   . Ileostomy closure 03/05/2012    Procedure: ILEOSTOMY TAKEDOWN;  Surgeon: Zaviyar Rahal C. Ettore Trebilcock, MD;  Location: [facility];  Service: General;  Laterality: N/A;  . Parastomal hernia repair 03/05/2012    Procedure: HERNIA REPAIR PARASTOMAL;  Surgeon: Claritza July C. Irvin Bastin, MD;  Location: [facility];  Service: General;  Laterality: N/A;  . Examination under anesthesia 03/25/2012    Procedure: EXAM UNDER ANESTHESIA;  Surgeon: Kyandre Okray C. Jessey Stehlin, MD;  Location: [facility];  Service: General;  Laterality: N/A;  recto vaginal fistula biopsy  . Colostomy takedown 03/25/2012    Procedure: LAPAROSCOPIC COLOSTOMY TAKEDOWN;  Surgeon: Emmit Oriley C. Brileigh Sevcik, MD;  Location: [facility];  Service: General;  Laterality: N/A;  diagnostic laparoscopy,ileocecectomy, creation of colostomy    History   Social History  . Marital Status: Married    Spouse Name: N/A    Number of Children: N/A  . Years of Education: N/A   Occupational History  . Not on file.   Social History Main Topics  .   Smoking status: Former Smoker -- 1.0 packs/day for 35 years    Types: Cigarettes    Quit date: 03/24/2009  . Smokeless tobacco: Never Used   Comment: 2010 QUIT SMOKING  . Alcohol Use: No  . Drug Use: No  . Sexually Active: No   Other Topics Concern  . Not on file   Social History Narrative  . No narrative on file    Family History  Problem Relation Age of Onset  . Hypertension Mother   . Cancer Father     lung  .  Diabetes Father   . Cancer Brother     bladder    Current Facility-Administered Medications  Medication Dose Route Frequency Provider Last Rate Last Dose  . acetaminophen (TYLENOL) tablet 650 mg  650 mg Oral Q6H PRN Krizia Flight C. Arsalan Brisbin, MD      . alum & mag hydroxide-simeth (MAALOX/MYLANTA) 200-200-20 MG/5ML suspension 30 mL  30 mL Oral Q6H PRN Brian Zeitlin C. Laural Eiland, MD      . bismuth subsalicylate (PEPTO BISMOL) 262 MG/15ML suspension 30 mL  30 mL Oral Q8H PRN Aadil Sur C. Rekisha Welling, MD      . dextrose 5 % in lactated ringers infusion   Intravenous Continuous Brandin Stetzer C. Pasqual Farias, MD      . diphenhydrAMINE (BENADRYL) injection 12.5-25 mg  12.5-25 mg Intravenous Q6H PRN Babygirl Trager C. Molly Savarino, MD      . gabapentin (NEURONTIN) capsule 200 mg  200 mg Oral TID PRN Kaydon Creedon C. Izyk Marty, MD      . HYDROmorphone (DILAUDID) injection 0.5-2 mg  0.5-2 mg Intravenous Q3H PRN Jarquis Walker C. Candas Deemer, MD      . influenza  inactive virus vaccine (FLUZONE/FLUARIX) injection 0.5 mL  0.5 mL Intramuscular Tomorrow-1000 Carmela Piechowski C. Delainie Chavana, MD      . lactated ringers bolus 1,000 mL  1,000 mL Intravenous Once Starnisha Batrez C. Cline Draheim, MD      . lactated ringers bolus 1,000 mL  1,000 mL Intravenous Q8H PRN Brecken Dewoody C. Shye Doty, MD      . lactated ringers bolus 1,000 mL  1,000 mL Intravenous Q8H PRN Dannisha Eckmann C. Tashianna Broome, MD      . lip balm (CARMEX) ointment 1 application  1 application Topical BID Jaunita Mikels C. Wright Gravely, MD      . magic mouthwash  15 mL Oral QID PRN Oona Trammel C. Doug Bucklin, MD      . ondansetron (ZOFRAN) injection 4 mg  4 mg Intravenous Q6H PRN Tannar Broker C. Lenzi Marmo, MD      . promethazine (PHENERGAN) injection 12.5-25 mg  12.5-25 mg Intravenous Q6H PRN Dominique Calvey C. Hajer Dwyer, MD      . psyllium (HYDROCIL/METAMUCIL) packet 1 packet  1 packet Oral Daily Bingham Millette C. Horrace Hanak, MD      . saccharomyces boulardii (FLORASTOR) capsule 250 mg  250 mg Oral BID Stephanieann Popescu C. Sameeha Rockefeller, MD      . traMADol (ULTRAM) tablet 50-100 mg  50-100 mg Oral Q6H PRN Chase Knebel C. Anabella Capshaw, MD      . DISCONTD: acetaminophen  (TYLENOL) suppository 650 mg  650 mg Rectal Q6H PRN Jones Viviani C. Renia Mikelson, MD      . DISCONTD: diphenhydrAMINE (BENADRYL) 12.5 MG/5ML elixir 12.5-25 mg  12.5-25 mg Oral Q6H PRN Nyko Gell C. Majel Giel, MD      . DISCONTD: diphenhydrAMINE (BENADRYL) injection 12.5-25 mg  12.5-25 mg Intravenous Q6H PRN Juanpablo Ciresi C. Winford Hehn, MD      . DISCONTD: psyllium (HYDROCIL/METAMUCIL) packet 1 packet  1 packet Oral BID Retina Bernardy C. Yaretsi Humphres, MD           Allergies  Allergen Reactions  . Oxycodone Nausea Only  . Sulfonamide Derivatives     REACTION: itching    ROS: Constitutional:  No fevers, chills, sweats.  Weight stable Eyes:  No vision changes, No discharge HENT:  No sore throats, nasal drainage Lymph: No neck swelling, No bruising easily Pulmonary:  No cough, productive sputum CV: No orthopnea, PND  Patient walks 30 minutes for about 1 miles without difficulty.  No exertional chest/neck/shoulder/arm pain.  GI:  + rectal cancer 2010.  + IBS on intermittent hyoscyamine.  No inflammatory bowel disease, allergy such as Celiac Sprue, dietary/dairy problems, colitis, ulcers nor gastritis.  No recent sick contacts/gastroenteritis.  No travel outside the country.  No changes in diet. Renal: No UTIs, No hematuria Genital:  No drainage, bleeding, masses Musculoskeletal: No severe joint pain.  Good ROM major joints Skin:  No sores or lesions.  No rashes Heme/Lymph:  No easy bleeding.  No swollen lymph nodes  BP 104/71  Pulse 122  Temp 98.3 F (36.8 C)  Resp 18  SpO2 97%  Physical Exam: General: Pt awake/alert/oriented x4 in mild acute distress Eyes: PERRL, normal EOM. Sclera nonicteric Neuro: CN II-XII intact w/o focal sensory/motor deficits. Lymph: No head/neck/groin lymphadenopathy Psych:  No delerium/psychosis/paranoia.  Tired, depressed, tearful but consolable HENT: Normocephalic, Mucus membranes moist.  No thrush Neck: Supple, No tracheal deviation Chest: No pain.  Good respiratory excursion. CV:  Pulses intact.   Regular rhythm Abdomen: Soft, Nondistended.  Min tender at incision only.  No peritonitis.  Wound granulating well & closing.  No incarcerated hernias. Ext:  SCDs BLE.  No significant edema.  No cyanosis Skin: No petechiae / purpurae  Results:   Labs: No results found for this or any previous visit (from the past 48 hour(s)).  Imaging / Studies: Ct Abdomen Pelvis W Contrast  03/23/2012  *RADIOLOGY REPORT*  Clinical Data: Evaluate for vaginal fistula  CT ABDOMEN AND PELVIS WITH CONTRAST  Technique:  Multidetector CT imaging of the abdomen and pelvis was performed following the standard protocol during bolus administration of intravenous contrast.  Contrast: 100mL OMNIPAQUE IOHEXOL 300 MG/ML  SOLN  Comparison: 07/11/2012  Findings: The lung bases are clear.  No pericardial or pleural effusion.  There is no focal liver abnormality.  Small amount of pericholecystic fluid is identified.  There is no significant biliary dilatation.  The pancreas is normal.  The spleen is within normal limits.  Both adrenal glands are normal.  The right kidney is normal.  The left kidney is normal.  Small amount of gas is noted within the lumen of the gallbladder. There is high-density enteric contrast material identified within the dome of the vagina.  Findings are concerning for colonic vaginal fistula.  Uterus appears normal.  Right posterior adnexal cyst measures 4.5 cm, image number 67.  Unchanged from previous exam.  Decrease in size of left adnexal cystic structure.  Right lower quadrant ileocolic lymph node measures 1 cm, image 49. This is compared with 0.7 cm previously.  No pelvic or inguinal adenopathy.  The stomach appears normal.  There has been reversal of previous right lower quadrant double-barreled ileostomy. Enteroenteric anastomosis has been performed. At the anastomotic site there is a heterogeneous and centrally low density mass which measures approximately 2.6 x 5.6 cm, image 34. The distal small bowel  loops are abnormal in appearance with evidence of mucosal enhancement and mural edema.  Wall thickening of the distal small bowel measures up to 10 mm, image 42.    Wall thickening and inflammatory changes involving the entire colon from the cecum to the rectum is also noticed.  No evidence for colonic perforation or abscess formation.  There is a drainage catheter which is situated in the right lower quadrant ventral abdominal wall.  Review of the visualized bony structures is unremarkable.  IMPRESSION:  1.  Examination is positive for rectovaginal fistula.  Enteric contrast material can be seen within the dome of the vagina.  2.  Abnormal wall thickening and inflammatory changes involving the distal small bowel consistent with enteritis. 3. At the enteroenteric anastomosis there is an abnormal and heterogeneous mass. This may represent a phlegmon formation. No discrete drainable abscess is noted.  Underlying tumor would be difficult to exclude and close interval follow-up is recommended. 4.  Pan colitis. 5.  Stable cystic structure within the right posterior pelvis. Interval improvement in the left ovarian cyst.   Original Report Authenticated By: TAYLOR H. STROUD, M.D.     Medications / Allergies: per chart  Antibiotics: Anti-infectives    None      Assessment  Topacio Capasso  56 y.o. 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  Possible abscess or partial small bowel obstruction  Plan:  Admit  IV fluids  Check urine and abdominal studies to rule out infection  Follow hemoglobin.  Control nausea better  Consider better control depression if she does not turnaround.    Consider restarting beta blocker if tachycardia persists and patient is not dehydrated/orthostatic.  -VTE prophylaxis- SCDs, etc -mobilize as tolerated to help recovery  Fatema Rabe C. Torian Quintero, M.D., F.A.C.S. Gastrointestinal and Minimally Invasive Surgery [facility] [address], [address] [place], [place] [address] [phone] Main / Paging [phone] Voice Mail   04/19/2012  

## 2012-04-19 NOTE — Telephone Encounter (Signed)
As I noted below

## 2012-04-19 NOTE — Telephone Encounter (Signed)
Called pt to let her know that I got her set up for the CT A/P for 9/25 at [facility] 9:15 w/stat labs to be drawn at [facility]. The pt advised me that she doesn't think she can tolerate the scan and requested to be admitted to [facility]. I told her I would page Dr Michaell Cowing. Dr Michaell Cowing agreed to admit pt to [facility]. I called [facility] and requested a bed on [address] [place] and for the pt to go to admitting to register. Dr Michaell Cowing will put orders in the system. I canceled the CT thru [facility] for 9/25. The pt understands to go to [facility] and I spoke to the husband.

## 2012-04-19 NOTE — Telephone Encounter (Signed)
CBC, CMET, CT scan of abd/pevlic w PO & IV contrast to r/o any abscess Full liquids x2 days. OK to hold off on any antidiarrheals/iron until n/v goes away

## 2012-04-19 NOTE — Telephone Encounter (Signed)
Pt calling in to report that yesterday all day was a ruff day for her having stomach pains w/cramping. The pt was having rectal and vaginal bleeding but the ostomy was doing fine with regular output no blood in the ostomy. The pt was not able to eat anything at all yesterday she did try to vomit several times but nothing came out since she didn't have anything on her stomach. The pt did have hot and cold chills. The pt is feeling better today she has been resting all morning. The pt was able to eat some potato soup and keep liquids down. The pt just feels very weak today but definitely better than yesterday with all the stomach cramping. The pt has not started the iron supplement yet b/c of all the stomach cramping she had she didn't want to add to it with the iron. Pls advise if we need to do anything.

## 2012-04-20 ENCOUNTER — Other Ambulatory Visit: Payer: 59

## 2012-04-20 DIAGNOSIS — N390 Urinary tract infection, site not specified: Secondary | ICD-10-CM | POA: Diagnosis present

## 2012-04-20 MED ORDER — HYOSCYAMINE SULFATE ER 0.375 MG PO TB12
0.3750 mg | ORAL_TABLET | Freq: Two times a day (BID) | ORAL | Status: DC
  Administered 2012-04-20 – 2012-04-21 (×3): 0.375 mg via ORAL
  Filled 2012-04-20 (×4): qty 1

## 2012-04-20 MED ORDER — CIPROFLOXACIN HCL 500 MG PO TABS
500.0000 mg | ORAL_TABLET | Freq: Two times a day (BID) | ORAL | Status: DC
  Administered 2012-04-20 – 2012-04-21 (×3): 500 mg via ORAL
  Filled 2012-04-20 (×6): qty 1

## 2012-04-20 MED ORDER — BOOST PLUS PO LIQD
237.0000 mL | ORAL | Status: DC | PRN
  Filled 2012-04-20: qty 237

## 2012-04-20 MED ORDER — FLUCONAZOLE 200 MG PO TABS
200.0000 mg | ORAL_TABLET | Freq: Once | ORAL | Status: AC
  Administered 2012-04-20: 200 mg via ORAL
  Filled 2012-04-20: qty 1

## 2012-04-20 MED ORDER — METOPROLOL TARTRATE 12.5 MG HALF TABLET
12.5000 mg | ORAL_TABLET | Freq: Two times a day (BID) | ORAL | Status: DC
  Administered 2012-04-20 – 2012-04-21 (×3): 12.5 mg via ORAL
  Filled 2012-04-20 (×4): qty 1

## 2012-04-20 MED ORDER — LACTATED RINGERS IV BOLUS (SEPSIS)
1000.0000 mL | Freq: Once | INTRAVENOUS | Status: AC
Start: 2012-04-20 — End: 2012-04-20
  Administered 2012-04-20: 1000 mL via INTRAVENOUS

## 2012-04-20 NOTE — Care Management Note (Signed)
    Page 1 of 1   04/21/2012     12:34:16 PM   CARE MANAGEMENT NOTE 04/21/2012  Patient:  Emily Holloway, Emily Holloway   Account Number:  [id number]  Date Initiated:  04/20/2012  Documentation initiated by:  Lorenda Ishihara  Subjective/Objective Assessment:   56 yo female admitted with n/v. PTA lived at home with spouse.     Action/Plan:   Active with [facility] for wound care.   Anticipated DC Date:  04/23/2012   Anticipated DC Plan:  HOME W HOME HEALTH SERVICES      DC Planning Services  CM consult      Choice offered to / List presented to:          [facility] arranged  HH-1 RN  HH-10 DISEASE MANAGEMENT      HH agency  [facility]   Status of service:  Completed, signed off Medicare Important Message given?   (If response is "NO", the following Medicare IM given date fields will be blank) Date Medicare IM given:   Date Additional Medicare IM given:    Discharge Disposition:  HOME W HOME HEALTH SERVICES  Per UR Regulation:  Reviewed for med. necessity/level of care/duration of stay  If discussed at Long Length of Stay Meetings, dates discussed:    Comments:  04-20-12 Lorenda Ishihara RN CM 1100 Spoke with Darl Pikes from [facility], patient currently active with them for RN. Will continue to follow for d/c needs.

## 2012-04-20 NOTE — Progress Notes (Signed)
Emily Holloway [id number] 1956-03-17   Subjective:  Less nauseated Slept better   Objective:  Vital signs:  Filed Vitals:   04/19/12 1700 04/19/12 2027 04/19/12 2206 04/20/12 0527  BP:   106/75 131/88  Pulse:  103 108 111  Temp:   98.7 F (37.1 C) 99.2 F (37.3 C)  TempSrc:   Oral Oral  Resp:  18 18 16   Height: 5\' 2"  (1.575 m) 5\' 2"  (1.575 m)    Weight: 139 lb 9.6 oz (63.322 kg) 139 lb 9.6 oz (63.322 kg)    SpO2:   97% 97%    Last BM Date: 04/19/12  Intake/Output   Yesterday:  09/23 0701 - 09/24 0700 In: 2188.3 [P.O.:150; I.V.:1038.3; IV Piggyback:1000] Out: 875 [Urine:875] This shift:     Bowel function:  Flatus: y  BM: y  Physical Exam:  General: Pt awake/alert/oriented x4 in no acute distress Eyes: PERRL, normal EOM.  Sclera clear.  No icterus Neuro: CN II-XII intact w/o focal sensory/motor deficits. Lymph: No head/neck/groin lymphadenopathy Psych:  No delerium/psychosis/paranoia HENT: Normocephalic, Mucus membranes moist.  No thrush Neck: Supple, No tracheal deviation Chest: No chest wall pain w good excursion CV:  Pulses intact.  Regular rhythm Abdomen: Soft.  Nondistended.  Mildly tender at incisions only.  No incarcerated hernias. Ext:  SCDs BLE.  No mjr edema.  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 female       Stabilizing  Plan:  -UTI: ABx -retry PO -IBS control -B blocker for tachycardia in absense of severe orthostasis - follow -wound care -sitz baths -no evid mjr  bleeding but could be falsely elevated due to dehydration - follow -VTE prophylaxis- SCDs, etc -mobilize as tolerated to help recovery  Ardeth Sportsman, M.D., F.A.C.S. Gastrointestinal and Minimally Invasive Surgery [facility] [address] [place], [place] [address] [phone] Main / Paging [phone] Voice Mail   04/20/2012  CARE TEAM:  PCP: Allean Found, MD  Outpatient Care Team: Patient Care Team: Candace Darolyn Rua, MD as PCP - General (Family Medicine) Iva Boop, MD as Consulting Physician (Gastroenterology) Ardeth Sportsman, MD (General Surgery) Martina Sinner, MD as Consulting Physician (Urology)  Inpatient Treatment Team: Treatment Team: Attending Provider: Ardeth Sportsman, MD; Technician: Vella Raring, NT; Registered Nurse: Lyla Son, RN   Results:   Labs: Results for orders placed during the hospital encounter of 04/19/12 (from the past 48 hour(s))  CBC     Status: Abnormal   Collection Time   04/19/12  4:13 PM      Component Value Range Comment   WBC 7.7  4.0 - 10.5 K/uL    RBC 4.63  3.87 - 5.11 MIL/uL    Hemoglobin 13.4  12.0 - 15.0 g/dL    HCT 95.2  84.1 - 32.4 %    MCV 88.8  78.0 - 100.0 fL    MCH 28.9  26.0 - 34.0 pg    MCHC 32.6  30.0 - 36.0 g/dL    RDW 40.1  11.5 - 15.5 %    Platelets 736 (*) 150 - 400 K/uL   COMPREHENSIVE METABOLIC PANEL     Status: Abnormal   Collection Time   04/19/12  4:13 PM      Component Value Range Comment   Sodium 135  135 - 145 mEq/L    Potassium 3.9  3.5 - 5.1 mEq/L    Chloride 96  96 - 112 mEq/L    CO2 24  19 - 32 mEq/L    Glucose, Bld 114 (*) 70 - 99 mg/dL    BUN 9  6 - 23 mg/dL    Creatinine, Ser 1.61  0.50 - 1.10 mg/dL    Calcium 09.6  8.4 - 10.5 mg/dL    Total Protein 8.7 (*) 6.0 - 8.3 g/dL    Albumin 3.8  3.5 - 5.2 g/dL    AST 79 (*) 0 - 37 U/L    ALT 103 (*) 0 - 35 U/L    Alkaline Phosphatase 149 (*) 39 - 117 U/L    Total  Bilirubin 0.4  0.3 - 1.2 mg/dL    GFR calc non Af Amer >90  >90 mL/min    GFR calc Af Amer >90  >90 mL/min   URINALYSIS, ROUTINE W REFLEX MICROSCOPIC     Status: Abnormal   Collection Time   04/19/12  4:44 PM      Component Value Range Comment   Color, Urine YELLOW  YELLOW    APPearance CLOUDY (*) CLEAR    Specific Gravity, Urine 1.012  1.005 - 1.030    pH 6.0  5.0 - 8.0    Glucose, UA NEGATIVE  NEGATIVE mg/dL    Hgb urine dipstick MODERATE (*) NEGATIVE    Bilirubin Urine NEGATIVE  NEGATIVE    Ketones, ur NEGATIVE  NEGATIVE mg/dL    Protein, ur NEGATIVE  NEGATIVE mg/dL    Urobilinogen, UA 0.2  0.0 - 1.0 mg/dL    Nitrite NEGATIVE  NEGATIVE    Leukocytes, UA LARGE (*) NEGATIVE   URINE MICROSCOPIC-ADD ON     Status: Abnormal   Collection Time   04/19/12  4:44 PM      Component Value Range Comment   Squamous Epithelial / LPF RARE  RARE    WBC, UA 11-20  <3 WBC/hpf    RBC / HPF 3-6  <3 RBC/hpf    Bacteria, UA FEW (*) RARE     Imaging / Studies: Ct Abdomen Pelvis Wo Contrast  04/19/2012  *RADIOLOGY REPORT*  Clinical Data: Rule out abscess and small bowel obstruction. History of rectovaginal fistula.  CT ABDOMEN AND PELVIS WITHOUT CONTRAST  Technique:  Multidetector CT imaging of the abdomen and pelvis was performed following the standard protocol without intravenous contrast.  Comparison: CT 03/23/2012  Findings: There is focal volume loss in the posterior right lower lung.  No Wannetta Langland abnormality to the liver or gallbladder.  Again noted is a liver cyst adjacent to the gallbladder.  There is no Nolawi Kanady abnormality to the adrenal glands, kidneys, spleen or pancreas.  The patient now has an ileostomy in the left lower quadrant of the abdomen.  There is no evidence for bowel dilatation.  Again noted is a distal colonic-rectal anastomosis.  The patient has also undergone an ileocecetomy.  Inflammation at the previous small bowel anastomosis has resolved or been removed.  There are postoperative  changes along the mid abdominal wall and right anterior abdominal wall.  There is a persistent fluid collection in the  right posterior pelvis that may be associated with the right ovary.  This collection measures up to 4.1 cm and minimally changed.  No Layaan Mott abnormality to the uterus.  There is no evidence for free fluid and no evidence for an abscess collection.  No acute bony abnormality.  IMPRESSION: No evidence for a small bowel obstruction or abscess collection.  Expected post surgical changes from an ileostomy and ileocecectomy.  Persistent low density fluid collection associated with the right adnexa.  This could represent a peritoneal inclusion cyst since it has been present since 07/11/2010.   Original Report Authenticated By: Richarda Overlie, M.D.     Medications / Allergies: per chart  Antibiotics: Anti-infectives     Start     Dose/Rate Route Frequency Ordered Stop   04/20/12 1000   fluconazole (DIFLUCAN) tablet 200 mg        200 mg Oral  Once 04/20/12 0757     04/20/12 0900   ciprofloxacin (CIPRO) tablet 500 mg        500 mg Oral 2 times daily 04/20/12 0753 04/23/12 0759

## 2012-04-20 NOTE — Consult Note (Signed)
WOC ostomy consult  Stoma type/location: LLQ Ileostomy Stomal assessment/size: 1 and 1/4 inches, round. Patient is well known to me from her previous admissions. Stoma and peristomal skin not visualized today as patient reports she just applied a new pouching system on Sunday before she came to the hospital and it does not need it today.  She uses an extended wear barrier at home and they typically last a minimum of 5 days.  Patient is well-versed in her ostomy care and does not need further care or instruction.  I will stand by in the event that she need anything during this admission, but will not see routinely. I have provided her with a spare ostomy set-up, but it is the equipment that we use here in house (standard wear barrier and transparent pouch, no gas filter). She may or may not wish to use that, but it is here if she needs it. Please re-consult if needed. It would be my pleasure to again see this very nice lady. Thanks, Ladona Mow, MSN, RN, GNP, CWOCN [phone])

## 2012-04-20 NOTE — Progress Notes (Signed)
INITIAL ADULT NUTRITION ASSESSMENT Date: 04/20/2012   Time: 1:25 PM Reason for Assessment: Nutrition risk   INTERVENTION: Diet advancement per MD. Unclear why pt on pudding thick liquids as pt denies any trouble swallowing, recommend MD change to thin liquid if appropriate. Boost Plus when appropriate within fluid restriction. Will monitor.   Pt meets criteria for severe malnutrition of chronic illness AEB <75% estimated energy intake for the past month with 12.5% weight loss in addition to pt with severe muscle wasting and subcutaneous fat loss in sternum region.   ASSESSMENT: Female 56 y.o.  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 Pt reports trying to eat 2-3 meals/day and drinking Boost in between meals at home. Pt reports very poor intake since Sunday PTA r/t nausea and vomiting. Pt reports emptying her ileostomy 6 times/day typically when it was 1/2 full, sometimes less. Pt reports typically consuming 6 cups of fluid/day and pt reports she was likely not taking in enough fluids at home. Pt with 20 pound unintended weight loss since last month which pt attributes to her recent surgery and hospitalization. Pt denies any problems chewing or swallowing.   Hx:  Past Medical History  Diagnosis Date  . Rectovaginal fistula     recurrent  . Rectal carcinoma     T2N0 s/p lap LAR with coloanal anastomosis  . Parastomal hernia s/p primary repair Aug2013 03/10/2012   Related Meds:  Scheduled Meds:   . ciprofloxacin  500 mg Oral BID  . fluconazole  200 mg Oral Once  . hyoscyamine  0.375 mg Oral Q12H  . influenza  inactive virus vaccine  0.5 mL Intramuscular Tomorrow-1000  . lactated ringers  1,000 mL Intravenous Once  . lactated ringers  1,000 mL Intravenous Once  . lip balm  1 application Topical BID  . metoprolol tartrate  12.5 mg Oral BID  . psyllium  1 packet Oral Daily  . saccharomyces  boulardii  250 mg Oral BID  . DISCONTD: psyllium  1 packet Oral BID   Continuous Infusions:   . dextrose 5% lactated ringers 50 mL/hr at 04/20/12 0954   PRN Meds:.acetaminophen, alum & mag hydroxide-simeth, bismuth subsalicylate, diphenhydrAMINE, gabapentin, HYDROmorphone (DILAUDID) injection, lactated ringers, magic mouthwash, ondansetron, promethazine, traMADol, DISCONTD: acetaminophen, DISCONTD: diphenhydrAMINE, DISCONTD: diphenhydrAMINE, DISCONTD: lactated ringers  Ht: 5\' 2"  (157.5 cm)  Wt: 139 lb 9.6 oz (63.322 kg)  Ideal Wt: 110 lb % Ideal Wt: 126  Usual Wt: 159 lb in August 2013 % Usual Wt: 87   Body mass index is 25.53 kg/(m^2).   Labs:  CMP     Component Value Date/Time   NA 135 04/19/2012 1613   K 3.9 04/19/2012 1613   CL 96 04/19/2012 1613   CO2 24 04/19/2012 1613   GLUCOSE 114* 04/19/2012 1613   BUN 9 04/19/2012 1613   CREATININE 0.66 04/19/2012 1613   CREATININE 0.66 04/14/2012 1043   CALCIUM 10.2 04/19/2012 1613   PROT 8.7* 04/19/2012 1613   ALBUMIN 3.8 04/19/2012 1613   AST 79* 04/19/2012 1613   ALT 103* 04/19/2012 1613   ALKPHOS 149* 04/19/2012 1613   BILITOT 0.4 04/19/2012 1613   GFRNONAA >90 04/19/2012 1613   GFRAA >90 04/19/2012 1613    Intake/Output Summary (Last 24 hours) at 04/20/12 1332 Last data filed at 04/20/12 1000  Gross per 24 hour  Intake 2188.33 ml  Output   1100 ml  Net 1088.33 ml  Last BM - 9/24  Diet Order: Full Liquid, fluid restriction, 2g sodium, pudding thick    IVF:    dextrose 5% lactated ringers Last Rate: 50 mL/hr at 04/20/12 0954    Estimated Nutritional Needs:   Kcal:1575-1850 Protein:75-95g Fluid:1.5-1.8L  NUTRITION DIAGNOSIS: -Inadequate oral intake (NI-2.1).  Status: Ongoing  RELATED TO: nausea/vomiting, recent surgery  AS EVIDENCE BY: pt statement, unintended weight loss  MONITORING/EVALUATION(Goals): Advance diet as tolerated to regular diet.   EDUCATION NEEDS: -No education needs identified at this  time   Dietitian #: [phone]  DOCUMENTATION CODES Per approved criteria  -Severe malnutrition in the context of chronic illness    Marshall Cork 04/20/2012, 1:25 PM
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
## 2012-04-21 MED ORDER — HYOSCYAMINE SULFATE ER 0.375 MG PO TB12: 0.3750 mg | ORAL_TABLET | Freq: Two times a day (BID) | ORAL | Status: DC

## 2012-04-21 MED ORDER — PROMETHAZINE HCL 12.5 MG PO TABS: 12.5000 mg | ORAL_TABLET | Freq: Four times a day (QID) | ORAL | Status: DC | PRN

## 2012-04-21 MED ORDER — GABAPENTIN 100 MG PO CAPS: 200.0000 mg | ORAL_CAPSULE | Freq: Three times a day (TID) | ORAL | Status: DC | PRN

## 2012-04-21 NOTE — Progress Notes (Signed)
D/C & F/U instructions discussed with pt who denies questions and verbalizes understanding.  Pt has spoken with [facility] RN this am and will be resuming home health care tomorrow 04/22/12.  Awaiting pt's husband for transport. dph

## 2012-04-21 NOTE — Discharge Summary (Signed)
Physician Discharge Summary  Patient ID: Emily Holloway MRN: [id number] DOB/AGE: April 14, 1956 56 y.o.  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  I admitted her and hydrated her.  Had some crampiness of her abdomen.  She had hematochezia at home but that calm down now.  Moderate ileostomy output but not severe.The patient mobilized and advanced to a solid diet gradually.  Pain was well-controlled and transitioned off IV medications.  Cramping seemed to settle down with an anti-spasmodic regimen.  Gabapentin helped as well.  Wound ostomy consultation was made.  She and the patient felt comfortable how the wound was progressing and how ostomy management was going.  By the time of discharge, the patient was walking well the hallways, eating food well, having flatus.  Pain was-controlled on an oral regimen.  Based on meeting DC criteria and recovering well, I felt it was safe for the patient to be discharged home with close followup.   Instructions were discussed in detail.  They are written as well.     Consults: None  Significant Diagnostic Studies:   Results for orders placed during the hospital encounter of 04/19/12 (from the past 72 hour(s))  CBC     Status: Abnormal   Collection Time   04/19/12  4:13 PM      Component Value Range Comment   WBC 7.7  4.0 - 10.5 K/uL    RBC 4.63  3.87 - 5.11 MIL/uL    Hemoglobin 13.4  12.0 - 15.0 g/dL    HCT 40.9  81.1 - 91.4 %    MCV 88.8  78.0 - 100.0 fL    MCH 28.9  26.0 - 34.0 pg    MCHC 32.6  30.0 - 36.0 g/dL    RDW 78.2  95.6 - 21.3 %    Platelets 736 (*) 150 - 400 K/uL   COMPREHENSIVE METABOLIC PANEL     Status: Abnormal   Collection Time   04/19/12  4:13 PM      Component Value Range Comment   Sodium 135  135 - 145 mEq/L    Potassium 3.9  3.5 - 5.1 mEq/L    Chloride 96  96 - 112 mEq/L    CO2 24  19 - 32 mEq/L    Glucose, Bld 114 (*) 70 - 99 mg/dL    BUN 9  6 - 23 mg/dL    Creatinine, Ser 0.86  0.50 - 1.10 mg/dL    Calcium 57.8  8.4 - 10.5 mg/dL    Total Protein 8.7 (*) 6.0 - 8.3 g/dL    Albumin 3.8  3.5 -  5.2 g/dL    AST 79 (*) 0 - 37 U/L    ALT 103 (*) 0 - 35 U/L    Alkaline Phosphatase 149 (*) 39 - 117 U/L    Total Bilirubin 0.4  0.3 - 1.2 mg/dL    GFR calc non Af Amer >90  >90 mL/min    GFR calc Af Amer >90  >90 mL/min   URINALYSIS, ROUTINE W REFLEX MICROSCOPIC     Status: Abnormal   Collection Time   04/19/12  4:44 PM      Component Value Range Comment   Color, Urine YELLOW  YELLOW    APPearance CLOUDY (*) CLEAR    Specific Gravity, Urine 1.012  1.005 - 1.030    pH 6.0  5.0 - 8.0    Glucose, UA NEGATIVE  NEGATIVE mg/dL    Hgb urine dipstick MODERATE (*) NEGATIVE    Bilirubin Urine NEGATIVE  NEGATIVE    Ketones, ur NEGATIVE  NEGATIVE mg/dL    Protein, ur NEGATIVE  NEGATIVE mg/dL    Urobilinogen, UA 0.2  0.0 - 1.0 mg/dL    Nitrite NEGATIVE  NEGATIVE    Leukocytes, UA LARGE (*) NEGATIVE   URINE MICROSCOPIC-ADD ON     Status: Abnormal   Collection  Time   04/19/12  4:44 PM      Component Value Range Comment   Squamous Epithelial / LPF RARE  RARE    WBC, UA 11-20  <3 WBC/hpf    RBC / HPF 3-6  <3 RBC/hpf    Bacteria, UA FEW (*) RARE     Ct Abdomen Pelvis Wo Contrast  04/19/2012  *RADIOLOGY REPORT*  Clinical Data: Rule out abscess and small bowel obstruction. History of rectovaginal fistula.  CT ABDOMEN AND PELVIS WITHOUT CONTRAST  Technique:  Multidetector CT imaging of the abdomen and pelvis was performed following the standard protocol without intravenous contrast.  Comparison: CT 03/23/2012  Findings: There is focal volume loss in the posterior right lower lung.  No Emerlyn Mehlhoff abnormality to the liver or gallbladder.  Again noted is a liver cyst adjacent to the gallbladder.  There is no Miklo Aken abnormality to the adrenal glands, kidneys, spleen or pancreas.  The patient now has an ileostomy in the left lower quadrant of the abdomen.  There is no evidence for bowel dilatation.  Again noted is a distal colonic-rectal anastomosis.  The patient has also undergone an ileocecetomy.  Inflammation at the previous small bowel anastomosis has resolved or been removed.  There are postoperative changes along the mid abdominal wall and right anterior abdominal wall.  There is a persistent fluid collection in the right posterior pelvis that may be associated with the right ovary.  This collection measures up to 4.1 cm and minimally changed.  No Rosalinda Seaman abnormality to the uterus.  There is no evidence for free fluid and no evidence for an abscess collection.  No acute bony abnormality.  IMPRESSION: No evidence for a small bowel obstruction or abscess collection.  Expected post surgical changes from an ileostomy and ileocecectomy.  Persistent low density fluid collection associated with the right adnexa.  This could represent a peritoneal inclusion cyst since it has been present since 07/11/2010.   Original Report Authenticated By: Richarda Overlie, M.D.     Treatments:    Discharge Exam: Blood pressure 102/70, pulse 86, temperature 98.2 F (36.8 C), temperature source Oral, resp. rate 16, height 5\' 2"  (1.575 m), weight 139 lb 9.6 oz (63.322 kg), SpO2 96.00%.  General:  Pt awake/alert/oriented x4 in no major acute distress Eyes: PERRL, normal EOM. Sclera nonicteric Neuro: CN II-XII intact w/o focal sensory/motor deficits. Lymph: No head/neck/groin lymphadenopathy Psych:  No delerium/psychosis/paranoia.  Alert, calm, in much better spirits. HENT: Normocephalic, Mucus membranes moist.  No thrush Neck: Supple, No tracheal deviation Chest: No pain.  Good respiratory excursion. CV:  Pulses intact.  Regular rhythm Abdomen: Soft, Nondistended.  Nontender.  No incarcerated hernias.  Wound granulating well and closing.  Ileostomy in place.  Thin effluent and gas in bag Ext:  SCDs BLE.  No significant edema.  No cyanosis Skin: No petechiae / purpurae   Disposition: 01-Home or Self Care  Discharge Orders    Future Appointments: Provider: Department: Dept Phone: Center:   04/28/2012 11:30 AM Ardeth Sportsman, MD [facility] [phone] None     Future Orders Please Complete By Expires   Diet - low sodium heart healthy      Increase activity slowly          Medication List     As of 04/21/2012  6:42 AM    TAKE these medications         ferrous sulfate 325 (65 FE) MG tablet   Take 1 tablet (325 mg total) by mouth 3 (three) times daily with meals.      gabapentin 100 MG capsule   Commonly known as: NEURONTIN   Take 200 mg by mouth 3 (three) times daily as needed. For pain      gabapentin 100 MG capsule   Commonly known as: NEURONTIN   Take 2 capsules (200 mg total) by mouth 3 (three) times daily as needed (pain).      hyoscyamine 0.375 MG 12 hr tablet   Commonly known as: LEVBID   Take 1 tablet (0.375 mg total) by mouth every 12 (twelve) hours.      promethazine 12.5 MG tablet   Commonly known as: PHENERGAN   Take 12.5-25 mg by mouth every 6  (six) hours as needed. nausea      promethazine 12.5 MG tablet   Commonly known as: PHENERGAN   Take 1-2 tablets (12.5-25 mg total) by mouth every 6 (six) hours as needed for nausea.      traMADol 50 MG tablet   Commonly known as: ULTRAM   Take 1-2 tablets (50-100 mg total) by mouth every 6 (six) hours as needed. For pain           Follow-up Information    Follow up with [facility]. (RN for wound care)    Contact information:   [address] [place] [address] [phone]         Signed: Ardeth Sportsman 04/21/2012, 6:42 AM

## 2012-04-28 ENCOUNTER — Ambulatory Visit (INDEPENDENT_AMBULATORY_CARE_PROVIDER_SITE_OTHER): Payer: 59 | Admitting: Surgery

## 2012-04-28 ENCOUNTER — Encounter (INDEPENDENT_AMBULATORY_CARE_PROVIDER_SITE_OTHER): Payer: Self-pay | Admitting: Surgery

## 2012-04-28 VITALS — BP 140/62 | HR 116 | Temp 98.0°F | Resp 20 | Ht 62.0 in | Wt 136.8 lb

## 2012-04-28 DIAGNOSIS — N824 Other female intestinal-genital tract fistulae: Secondary | ICD-10-CM

## 2012-04-28 DIAGNOSIS — T8131XA Disruption of external operation (surgical) wound, not elsewhere classified, initial encounter: Secondary | ICD-10-CM

## 2012-04-28 DIAGNOSIS — Z85048 Personal history of other malignant neoplasm of rectum, rectosigmoid junction, and anus: Secondary | ICD-10-CM

## 2012-04-28 DIAGNOSIS — K589 Irritable bowel syndrome without diarrhea: Secondary | ICD-10-CM

## 2012-04-28 DIAGNOSIS — N823 Fistula of vagina to large intestine: Secondary | ICD-10-CM

## 2012-04-28 DIAGNOSIS — S31109A Unspecified open wound of abdominal wall, unspecified quadrant without penetration into peritoneal cavity, initial encounter: Secondary | ICD-10-CM

## 2012-04-28 DIAGNOSIS — Z932 Ileostomy status: Secondary | ICD-10-CM

## 2012-04-30 NOTE — Progress Notes (Signed)
Subjective:     Patient ID: Emily Holloway, female   DOB: [birth year], 56 y.o.   MRN: [id number]  HPI   Emily Holloway  1956-05-27 [id number]  Patient Care Team: Allean Found, MD as PCP - General (Family Medicine) Iva Boop, MD as Consulting Physician (Gastroenterology) Ardeth Sportsman, MD (General Surgery) Martina Sinner, MD as Consulting Physician (Urology)  This patient is a 56 y.o.female who presents today for surgical evaluation Status post resection of broken down anastomosis and end ileostomy for recurrent rectovaginal fistula 03/25/2012.   FINAL DIAGNOSIS Diagnosis 1. 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 2. Small intestine, resection, ileo cecum - ACUTE ULCER. - CHRONIC ACTIVE ILEITIS AND COLITIS. - NO GRANULOMA OR DYSPLASIA. - FOUR LYMPH NODES, NEGATIVE FOR NEOPLASM. - RESECTION MARGINS ARE VIABLE. - APPENDICEAL TISSUE WITH CHRONIC ACTIVE APPENDICITIS. Abigail Miyamoto MD Pathologist, Electronic Signature (Case signed 03/26/2012)  Patient comes in today feeling more strong.  She was having cramping nausea and vomiting.  Required readmission with IV fluids.  CT scan ruled out abscess fistula or other major complication.  Since she has been home she definitely feels better.  Vaginal drainage going down.  Emptying ileostomy 4-6 times a day without help of any antidiarrheals.  No skin problems.    No lightheadedness or dizziness.  Appetite better.  Urinating fine.  More active.  Came in on her own.  Getting daily dressing changes.  Wound granulating well and much less sensitive.   Patient Active Problem List  Diagnosis  . 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 History of rectal cancer, T2N0, Nov2010  . IBS (irritable bowel syndrome)  . 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 Anemia  in chronic illness  . Open abdominal incision   . Blisters with epidermal loss due to burn (second degree) of right lateral breast  . Nausea & vomiting  . UTI (lower urinary tract infection)    Past Medical History  Diagnosis Date  . Rectovaginal fistula     recurrent  . Rectal carcinoma     T2N0 s/p lap LAR with coloanal anastomosis  . Parastomal hernia s/p primary repair Aug2013 03/10/2012    Past Surgical History  Procedure Date  . Colon resection 2010    Rectal cancer s/p lap LAR/coloanal anastomosis  . Ostomy placement 2011    with RV fistula repair  . Tubal ligation   . Rectovaginal fistula closure [id number], [id number]  . Colonoscopy w/ biopsies and polypectomy 04/09/2009    adenocarcinoma  . Recto-vaginal fissure repair 10/23/2011    Procedure: REPAIR RECTO-VAGINAL FISTULA;  Surgeon: Ardeth Sportsman, MD;  Location: [facility];  Service: General;  Laterality: N/A;  Repair of Rectovaginal Fistula with Pedicle Martius Flap with Dr Lorin Picket MacDiarmid to co-surgeon   . Ileostomy closure 03/05/2012    Procedure: ILEOSTOMY TAKEDOWN;  Surgeon: Ardeth Sportsman, MD;  Location: [facility];  Service: General;  Laterality: N/A;  . Parastomal hernia repair 03/05/2012    Procedure: HERNIA REPAIR PARASTOMAL;  Surgeon: Ardeth Sportsman, MD;  Location: [facility];  Service: General;  Laterality: N/A;  . Examination under anesthesia 03/25/2012    Procedure: EXAM UNDER ANESTHESIA;  Surgeon: Ardeth Sportsman, MD;  Location: [facility];  Service: General;  Laterality: N/A;  recto vaginal fistula biopsy  . Colostomy takedown 03/25/2012    Procedure: LAPAROSCOPIC COLOSTOMY TAKEDOWN;  Surgeon: Salvatore Decent.  Jeraline Marcinek, MD;  Location: [facility];  Service: General;  Laterality: N/A;  diagnostic laparoscopy,ileocecectomy, creation of colostomy    History   Social History  . Marital Status: Married    Spouse Name: N/A    Number of Children: N/A  . Years of Education: N/A   Occupational History  . Not on file.   Social History Main  Topics  . Smoking status: Former Smoker -- 1.0 packs/day for 35 years    Types: Cigarettes    Quit date: 03/24/2009  . Smokeless tobacco: Never Used   Comment: 2010 QUIT SMOKING  . Alcohol Use: No  . Drug Use: No  . Sexually Active: No   Other Topics Concern  . Not on file   Social History Narrative  . No narrative on file    Family History  Problem Relation Age of Onset  . Hypertension Mother   . Cancer Father     lung  . Diabetes Father   . Cancer Brother     bladder    Current Outpatient Prescriptions  Medication Sig Dispense Refill  . hyoscyamine (LEVBID) 0.375 MG 12 hr tablet Take 0.375 mg by mouth every 12 (twelve) hours as needed.      . promethazine (PHENERGAN) 12.5 MG tablet Take 12.5-25 mg by mouth every 6 (six) hours as needed. nausea      . traMADol (ULTRAM) 50 MG tablet Take 1-2 tablets (50-100 mg total) by mouth every 6 (six) hours as needed. For pain  60 tablet  0  . ferrous sulfate (FERROUSUL) 325 (65 FE) MG tablet Take 1 tablet (325 mg total) by mouth 3 (three) times daily with meals.  90 tablet  3  . gabapentin (NEURONTIN) 100 MG capsule Take 200 mg by mouth 3 (three) times daily as needed. For pain      . gabapentin (NEURONTIN) 100 MG capsule Take 2 capsules (200 mg total) by mouth 3 (three) times daily as needed (pain).  30 capsule  2  . promethazine (PHENERGAN) 12.5 MG tablet Take 1-2 tablets (12.5-25 mg total) by mouth every 6 (six) hours as needed for nausea.  20 tablet  3     Allergies  Allergen Reactions  . Oxycodone Nausea Only  . Sulfonamide Derivatives     REACTION: itching    BP 140/62  Pulse 116  Temp 98 F (36.7 C) (Temporal)  Resp 20  Ht 5\' 2"  (1.575 m)  Wt 136 lb 12.8 oz (62.052 kg)  BMI 25.02 kg/m2  Ct Abdomen Pelvis W Contrast  03/23/2012  *RADIOLOGY REPORT*  Clinical Data: Evaluate for vaginal fistula  CT ABDOMEN AND PELVIS WITH CONTRAST  Technique:  Multidetector CT imaging of the abdomen and pelvis was performed following  the standard protocol during bolus administration of intravenous contrast.  Contrast: OMNIPAQUE IOHEXOL 300 MG/ML  SOLN  Comparison: 07/11/2012  Findings: The lung bases are clear.  No pericardial or pleural effusion.  There is no focal liver abnormality.  Small amount of pericholecystic fluid is identified.  There is no significant biliary dilatation.  The pancreas is normal.  The spleen is within normal limits.  Both adrenal glands are normal.  The right kidney is normal.  The left kidney is normal.  Small amount of gas is noted within the lumen of the gallbladder. There is high-density enteric contrast material identified within the dome of the vagina.  Findings are concerning for colonic vaginal fistula.  Uterus appears normal.  Right posterior adnexal cyst measures 4.5  cm, image number 67.  Unchanged from previous exam.  Decrease in size of left adnexal cystic structure.  Right lower quadrant ileocolic lymph node measures 1 cm, image 49. This is compared with 0.7 cm previously.  No pelvic or inguinal adenopathy.  The stomach appears normal.  There has been reversal of previous right lower quadrant double-barreled ileostomy. Enteroenteric anastomosis has been performed. At the anastomotic site there is a heterogeneous and centrally low density mass which measures approximately 2.6 x 5.6 cm, image 34. The distal small bowel loops are abnormal in appearance with evidence of mucosal enhancement and mural edema.  Wall thickening of the distal small bowel measures up to 10 mm, image 42.  Wall thickening and inflammatory changes involving the entire colon from the cecum to the rectum is also noticed.  No evidence for colonic perforation or abscess formation.  There is a drainage catheter which is situated in the right lower quadrant ventral abdominal wall.  Review of the visualized bony structures is unremarkable.  IMPRESSION:  1.  Examination is positive for rectovaginal fistula.  Enteric contrast material can  be seen within the dome of the vagina.  2.  Abnormal wall thickening and inflammatory changes involving the distal small bowel consistent with enteritis. 3. At the enteroenteric anastomosis there is an abnormal and heterogeneous mass. This may represent a phlegmon formation. No discrete drainable abscess is noted.  Underlying tumor would be difficult to exclude and close interval follow-up is recommended. 4.  Pan colitis. 5.  Stable cystic structure within the right posterior pelvis. Interval improvement in the left ovarian cyst.   Original Report Authenticated By: Rosealee Albee, M.D.      Review of Systems  Constitutional: Negative for fever, chills and diaphoresis.  HENT: Negative for ear pain, sore throat and trouble swallowing.   Eyes: Negative for photophobia and visual disturbance.  Respiratory: Negative for cough and choking.   Cardiovascular: Negative for chest pain and palpitations.  Gastrointestinal: Negative for nausea, vomiting, abdominal pain, diarrhea, constipation, anal bleeding and rectal pain.  Genitourinary: Negative for dysuria, frequency and difficulty urinating.  Musculoskeletal: Negative for myalgias and gait problem.  Skin: Negative for color change, pallor and rash.  Neurological: Negative for dizziness, speech difficulty, weakness and numbness.  Hematological: Negative for adenopathy.  Psychiatric/Behavioral: Negative for confusion and agitation. The patient is not nervous/anxious.        Objective:   Physical Exam  Constitutional: She is oriented to person, place, and time. She appears well-developed and well-nourished. No distress.  HENT:  Head: Normocephalic.  Mouth/Throat: Oropharynx is clear and moist. No oropharyngeal exudate.  Eyes: Conjunctivae normal and EOM are normal. Pupils are equal, round, and reactive to light. No scleral icterus.  Neck: Normal range of motion. No tracheal deviation present.  Cardiovascular: Normal rate and intact distal pulses.    Pulmonary/Chest: Effort normal. No respiratory distress. She exhibits no tenderness. Right breast exhibits skin change. Right breast exhibits no inverted nipple, no mass, no nipple discharge and no tenderness. Left breast exhibits no inverted nipple, no mass, no nipple discharge, no skin change and no tenderness.    Abdominal: Soft. She exhibits no distension. There is no tenderness. Hernia confirmed negative in the right inguinal area and confirmed negative in the left inguinal area.         Incisions clean with normal healing ridges.  No hernias  Genitourinary: No vaginal discharge found.  Musculoskeletal: Normal range of motion. She exhibits no tenderness.  Lymphadenopathy:  Right: No inguinal adenopathy present.       Left: No inguinal adenopathy present.  Neurological: She is alert and oriented to person, place, and time. No cranial nerve deficit. She exhibits normal muscle tone. Coordination normal.  Skin: Skin is warm and dry. No rash noted. She is not diaphoretic.  Psychiatric: She has a normal mood and affect. Her behavior is normal.       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  Now with end ileostomy  Open midline wound granulating  Blisters on right lateral breast with history of heating pad.  Probable contact burn. Resolving     Plan:     Increase activity as tolerated.  Do not push through pain.  Advanced on diet as tolerated. Bowel regimen to avoid problems.Imodium when necessary.  Anemia.  Iron.  Check labs.  I can help constipate her as well.  Continue wound care.    Blisters on right lateral breast probable partial thickness skin burns.  Should resolve on their own.  Needs to be followed.  If worsens, may need punch biopsy or evaluation by one of my breast surgical colleagues.  We'll continue to monitor.  Hold off on vaginal examination the soon since clinically improving.    If repair is wishedto be done, would  probably require proctectomy with new coloanal anastomosis and probable gracilis muscle flap reinforcement on the rectal side since it's the area of breakdown.  Beyond the scope of what I can do at this moment given the lack of gracilis flap creation support in town right now.  I have discussed with our new colorectal partner.  I have discussed with Dr. Kelly Splinter with reconstructive surgery as well.  She may be able to provide rotational flap coverage later versus getting help from [facility].  Other option is 2nd opinion at another major surgical institution.  Another option is permanent ileostomy.blood initially leaning towards permanent ileostomy, she now wishes to reconsider in a few months.  I think she wants to try repair one more time.  At the very least may be revised to an end colostomy.  I cautioned her that revising to colostomy may make a proctectomy rectovaginal fistula repair with muscle reinforcement and reconstruction of the more of a challenge.  She notes the most frustrating thing is the cramping and drainage.  However that is improving already.  She is hopeful it will calm down to nothing.  Return to clinic in 2 weeks to follow wound. The patient expressed understanding and appreciation

## 2012-05-17 ENCOUNTER — Encounter (INDEPENDENT_AMBULATORY_CARE_PROVIDER_SITE_OTHER): Payer: Self-pay | Admitting: Surgery

## 2012-05-17 ENCOUNTER — Ambulatory Visit (INDEPENDENT_AMBULATORY_CARE_PROVIDER_SITE_OTHER): Payer: 59 | Admitting: Surgery

## 2012-05-17 VITALS — BP 108/80 | HR 96 | Temp 98.2°F | Ht 62.0 in | Wt 142.4 lb

## 2012-05-17 DIAGNOSIS — T8131XA Disruption of external operation (surgical) wound, not elsewhere classified, initial encounter: Secondary | ICD-10-CM

## 2012-05-17 DIAGNOSIS — T81321A Disruption or dehiscence of closure of internal operation (surgical) wound of abdominal wall muscle or fascia, initial encounter: Secondary | ICD-10-CM

## 2012-05-17 DIAGNOSIS — Z932 Ileostomy status: Secondary | ICD-10-CM

## 2012-05-17 DIAGNOSIS — N824 Other female intestinal-genital tract fistulae: Secondary | ICD-10-CM

## 2012-05-17 DIAGNOSIS — S31109A Unspecified open wound of abdominal wall, unspecified quadrant without penetration into peritoneal cavity, initial encounter: Secondary | ICD-10-CM

## 2012-05-17 DIAGNOSIS — N823 Fistula of vagina to large intestine: Secondary | ICD-10-CM

## 2012-05-17 DIAGNOSIS — Z85048 Personal history of other malignant neoplasm of rectum, rectosigmoid junction, and anus: Secondary | ICD-10-CM

## 2012-05-17 DIAGNOSIS — T2121XA Burn of second degree of chest wall, initial encounter: Secondary | ICD-10-CM
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
## 2012-05-17 NOTE — Patient Instructions (Addendum)
Ostomy Support Information An ostomy is an opening in the belly (abdominal wall) made by surgery. Ostomates are people who have had this procedure. The opening (stoma) allows the kidney or bowel to discharge waste. An external pouch covers the stoma to collect waste. Pouches are are a simple bag and are odor free. Different companies have disposable or reusable pouches to fit one's lifestyle. An ostomy can either be temporary or permanent.  THERE ARE THREE MAIN TYPES OF OSTOMIES  Colostomy. A colostomy is a surgically created opening in the large intestine (colon).  Ileostomy. An ileostomy is a surgically created opening in the small intestine.  Urostomy. A urostomy is a surgically created opening to divert urine away from the bladder. FREQUENTLY ASKED QUESTIONS Will I need to be on a special diet? Most people return to their normal diet when they have recovered from surgery. Be sure to chew your food well, eat a well-balanced diet and drink plenty of fluids. If you experience problems with a certain food, wait a couple of weeks and try it again. Will there be odor and noises? Pouching systems are designed to be odor-proof or odor-resistant. There are deodorants that can be used in the pouch. Medications are also available to help reduce odor. Limit gas-producing foods and carbonated beverages. You will experience less gas and fewer noises as you heal from surgery. How much time will it take to care for my ostomy? At first, you may spend a lot of time learning about your ostomy and how to take care of it. As you become more comfortable and skilled at changing the pouching system, it will take very little time to care for it.  Will I be able to return to work? People with ostomies can perform most jobs. As soon as you have healed from surgery, you should be able to return to work. Heavy lifting (more than 10 pounds) may be discouraged.  What about intimacy? Sexual relationships and intimacy are  important and fulfilling aspects of your life. They should continue after ostomy surgery. Intimacy-related concerns should be discussed openly between you and your partner.  Can I wear regular clothing? You do not need to wear special clothing. Ostomy pouches are fairly flat and barely noticeable. Elastic undergarments will not hurt the stoma or prevent the ostomy from functioning.  Can I participate in sports? An ostomy should not limit your involvement in sports. Many people with ostomies are runners, skiers, swimmers or participate in other active lifestyles. Talk with your caregiver first before doing heavy physical activity. PROFESSIONAL HELP  Resources are available if you need help or have questions about your ostomy.   Specially trained nurses called Wound, Ostomy Continence Nurses (WOCN) are available for consultation in most major medical centers.  The [employer] (UOA) is a group made up of many local chapters throughout the [place]. These local groups hold meetings and provide support to prospective and existing ostomates. They sponsor educational events and have qualified visitors to make personal or telephone visits. Contact the UOA for the chapter nearest you and for other educational publications.  More detailed information can be found in Colostomy Guide, a publication of the [employer] (UOA). Contact UOA at 1-800-826-0826 or visit their web site at www.uoaa.org. The website contains links to other sites, suppliers and resources. Document Released: 07/17/2003 Document Revised: 10/06/2011 Document Reviewed: 11/15/2008 Exit[employer] Patient Information 2013 [employer].  

## 2012-06-14 ENCOUNTER — Ambulatory Visit (INDEPENDENT_AMBULATORY_CARE_PROVIDER_SITE_OTHER): Payer: 59 | Admitting: Surgery

## 2012-06-14 ENCOUNTER — Encounter (INDEPENDENT_AMBULATORY_CARE_PROVIDER_SITE_OTHER): Payer: Self-pay | Admitting: Surgery

## 2012-06-14 VITALS — BP 118/70 | HR 64 | Temp 99.4°F | Resp 16 | Ht 62.0 in | Wt 146.0 lb

## 2012-06-14 DIAGNOSIS — Z85048 Personal history of other malignant neoplasm of rectum, rectosigmoid junction, and anus: Secondary | ICD-10-CM

## 2012-06-14 DIAGNOSIS — N824 Other female intestinal-genital tract fistulae: Secondary | ICD-10-CM

## 2012-06-14 DIAGNOSIS — T2121XA Burn of second degree of chest wall, initial encounter: Secondary | ICD-10-CM

## 2012-06-14 DIAGNOSIS — N823 Fistula of vagina to large intestine: Secondary | ICD-10-CM

## 2012-06-14 DIAGNOSIS — Z932 Ileostomy status: Secondary | ICD-10-CM

## 2012-06-14 MED ORDER — TRAMADOL HCL 50 MG PO TABS: 50.0000 mg | ORAL_TABLET | Freq: Four times a day (QID) | ORAL | Status: DC | PRN
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
## 2012-07-29 ENCOUNTER — Ambulatory Visit (INDEPENDENT_AMBULATORY_CARE_PROVIDER_SITE_OTHER): Payer: 59 | Admitting: Surgery

## 2012-07-29 ENCOUNTER — Encounter (INDEPENDENT_AMBULATORY_CARE_PROVIDER_SITE_OTHER): Payer: Self-pay | Admitting: Surgery

## 2012-07-29 VITALS — BP 112/70 | HR 70 | Temp 98.1°F | Resp 18 | Ht 62.0 in | Wt 148.0 lb

## 2012-07-29 DIAGNOSIS — D638 Anemia in other chronic diseases classified elsewhere: Secondary | ICD-10-CM

## 2012-07-29 DIAGNOSIS — Z85048 Personal history of other malignant neoplasm of rectum, rectosigmoid junction, and anus: Secondary | ICD-10-CM

## 2012-07-29 DIAGNOSIS — N823 Fistula of vagina to large intestine: Secondary | ICD-10-CM

## 2012-07-29 DIAGNOSIS — N824 Other female intestinal-genital tract fistulae: Secondary | ICD-10-CM
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
## 2012-08-10 ENCOUNTER — Telehealth (INDEPENDENT_AMBULATORY_CARE_PROVIDER_SITE_OTHER): Payer: Self-pay

## 2012-08-10 NOTE — Telephone Encounter (Signed)
Pt called to let me know that she was notified by Dr Kathi Ludwig office of the appt date and time. The pt r/s the appt from 1/17 to 2/6 at 1:30 with Dr Byrd Hesselbach.

## 2012-08-10 NOTE — Telephone Encounter (Signed)
Dr Kathi Ludwig office made the pt an appt for Friday 08/13/12 arrive at 8:45 for 9:00. They are requesting records to be faxed to medical records [phone]. The pt will need to go to [facility] 5th floor at [facility]. If any questions pls call [phone] opt.1.

## 2012-08-11 ENCOUNTER — Telehealth (INDEPENDENT_AMBULATORY_CARE_PROVIDER_SITE_OTHER): Payer: Self-pay

## 2012-08-11 NOTE — Telephone Encounter (Signed)
Emily Holloway called to report the fax you sent on her has not all come through.  They only got 21 pages out of 117.

## 2012-08-13 ENCOUNTER — Telehealth (INDEPENDENT_AMBULATORY_CARE_PROVIDER_SITE_OTHER): Payer: Self-pay

## 2012-08-13 NOTE — Telephone Encounter (Signed)
LMOM with medical records stating that I have tried 2 different times to fax 117 pages to Dr Byrd Hesselbach for an appt on the pt for 2/6 but it keeps getting disconnected. I want to know what is the next best way to get the records to their office.

## 2012-08-13 NOTE — Telephone Encounter (Signed)
Medical records just called me back to let me know that the have received all the records on the pt for the appt with Dr Byrd Hesselbach.

## 2012-09-20 ENCOUNTER — Other Ambulatory Visit (INDEPENDENT_AMBULATORY_CARE_PROVIDER_SITE_OTHER): Payer: Self-pay | Admitting: Surgery

## 2012-10-18 ENCOUNTER — Encounter: Payer: Self-pay | Admitting: Internal Medicine

## 2012-12-14 ENCOUNTER — Emergency Department (HOSPITAL_COMMUNITY)
Admission: EM | Admit: 2012-12-14 | Discharge: 2012-12-14 | Disposition: A | Discharge status: Home / Self Care | Payer: 59 | Attending: Emergency Medicine | Admitting: Emergency Medicine

## 2012-12-14 ENCOUNTER — Emergency Department (HOSPITAL_COMMUNITY): Payer: 59

## 2012-12-14 ENCOUNTER — Encounter (HOSPITAL_COMMUNITY): Payer: Self-pay | Admitting: *Deleted

## 2012-12-14 DIAGNOSIS — N824 Other female intestinal-genital tract fistulae: Secondary | ICD-10-CM | POA: Insufficient documentation

## 2012-12-14 DIAGNOSIS — Z87442 Personal history of urinary calculi: Secondary | ICD-10-CM | POA: Insufficient documentation

## 2012-12-14 DIAGNOSIS — Z8719 Personal history of other diseases of the digestive system: Secondary | ICD-10-CM | POA: Insufficient documentation

## 2012-12-14 DIAGNOSIS — C20 Malignant neoplasm of rectum: Secondary | ICD-10-CM | POA: Insufficient documentation

## 2012-12-14 DIAGNOSIS — Z87891 Personal history of nicotine dependence: Secondary | ICD-10-CM | POA: Insufficient documentation

## 2012-12-14 DIAGNOSIS — R112 Nausea with vomiting, unspecified: Secondary | ICD-10-CM | POA: Insufficient documentation

## 2012-12-14 DIAGNOSIS — N23 Unspecified renal colic: Secondary | ICD-10-CM | POA: Insufficient documentation

## 2012-12-14 DIAGNOSIS — Z933 Colostomy status: Secondary | ICD-10-CM | POA: Insufficient documentation

## 2012-12-14 LAB — URINALYSIS, ROUTINE W REFLEX MICROSCOPIC
Glucose, UA: NEGATIVE mg/dL
Ketones, ur: NEGATIVE mg/dL
Leukocytes, UA: NEGATIVE
pH: 5.5 (ref 5.0–8.0)

## 2012-12-14 MED ORDER — SODIUM CHLORIDE 0.9 % IV SOLN
INTRAVENOUS | Status: DC
  Administered 2012-12-14: 01:00:00 via INTRAVENOUS

## 2012-12-14 MED ORDER — ONDANSETRON HCL 4 MG/2ML IJ SOLN
4.0000 mg | Freq: Once | INTRAMUSCULAR | Status: AC
  Administered 2012-12-14: 4 mg via INTRAVENOUS
  Filled 2012-12-14: qty 2

## 2012-12-14 MED ORDER — KETOROLAC TROMETHAMINE 30 MG/ML IJ SOLN
15.0000 mg | Freq: Once | INTRAMUSCULAR | Status: AC
  Administered 2012-12-14: 15 mg via INTRAVENOUS
  Filled 2012-12-14: qty 1

## 2012-12-14 MED ORDER — HYDROMORPHONE HCL PF 1 MG/ML IJ SOLN
1.0000 mg | Freq: Once | INTRAMUSCULAR | Status: AC
  Administered 2012-12-14: 1 mg via INTRAVENOUS
  Filled 2012-12-14: qty 1

## 2012-12-14 NOTE — ED Notes (Signed)
Pt states that she was awakened suddenly around 2315 with severe back and abdominal pain; pt states that something doesn't seem right with ostomy; pt reports that she emptied her ostomy prior to arrival and that it has not put anythign out and isn't rising and falling like it normally does.

## 2012-12-14 NOTE — ED Provider Notes (Signed)
History     CSN: [id number]  Arrival date & time 12/14/12  0020   First MD Initiated Contact with Patient 12/14/12 0030      Chief Complaint  Patient presents with  . Back and Abdominal Pain     (Consider location/radiation/quality/duration/timing/severity/associated sxs/prior treatment) HPI This is a 57 year old female with a history of cancer and colostomy. She is here with severe left flank pain radiating to the left lower quadrant. It came on suddenly about 11:15 yesterday evening. Nothing makes the pain better or worse. It has been associated with nausea and vomiting. She has not noticed any urinary changes. She has a history of kidney stones. She states her ostomy is not putting anything out and "isn't rising and falling like it normally does".   Past Medical History  Diagnosis Date  . Rectovaginal fistula     recurrent  . Rectal carcinoma     T2N0 s/p lap LAR with coloanal anastomosis  . Parastomal hernia s/p primary repair [id number] 03/10/2012  . Blisters with epidermal loss due to burn (second degree) of right lateral breast 04/14/2012    Past Surgical History  Procedure Laterality Date  . Colon resection  2010    Rectal cancer s/p lap LAR/coloanal anastomosis  . Ostomy placement  2011    with RV fistula repair  . Tubal ligation    . Rectovaginal fistula closure  [id number], [id number]  . Colonoscopy w/ biopsies and polypectomy  04/09/2009    adenocarcinoma  . Recto-vaginal fissure repair  10/23/2011    Procedure: REPAIR RECTO-VAGINAL FISTULA;  Surgeon: Ardeth Sportsman, MD;  Location: [facility];  Service: General;  Laterality: N/A;  Repair of Rectovaginal Fistula with Pedicle Martius Flap with Dr Lorin Picket MacDiarmid to co-surgeon   . Ileostomy closure  03/05/2012    Procedure: ILEOSTOMY TAKEDOWN;  Surgeon: Ardeth Sportsman, MD;  [facility]: [facility];  Service: General;  Laterality: N/A;  . Parastomal hernia repair  03/05/2012    Procedure: HERNIA REPAIR PARASTOMAL;  Surgeon: Ardeth Sportsman,  MD;  [facility];  Service: General;  Laterality: N/A;  . Examination under anesthesia  03/25/2012    Procedure: EXAM UNDER ANESTHESIA;  Surgeon: Ardeth Sportsman, MD;  [facility];  Service: General;  Laterality: N/A;  recto vaginal fistula biopsy  . Colostomy takedown  03/25/2012    Procedure: LAPAROSCOPIC COLOSTOMY TAKEDOWN;  Surgeon: Ardeth Sportsman, MD;  [facility];  Service: General;  Laterality: N/A;  diagnostic laparoscopy,ileocecectomy, creation of colostomy    Family History  Problem Relation Age of Onset  . Hypertension Mother   . Cancer Father     lung  . Diabetes Father   . Cancer Brother     bladder    History  Substance Use Topics  . Smoking status: Former Smoker -- 1.00 packs/day for 35 years    Types: Cigarettes    Quit date: 03/24/2009  . Smokeless tobacco: Never Used     Comment: 2010 QUIT SMOKING  . Alcohol Use: No    OB History   Grav Para Term Preterm Abortions TAB SAB Ect Mult Living                  Review of Systems  All other systems reviewed and are negative.    Allergies  Oxycodone and Sulfonamide derivatives  Home Medications   Current Outpatient Rx  Name  Route  Sig  Dispense  Refill  . traMADol (ULTRAM) 50 MG tablet  TAKE ONE TO TWO TABLETS BY MOUTH EVERY 6 HOURS AS NEEDED FOR PAIN   60 tablet   0     BP 129/78  Pulse 112  Temp(Src) 98.2 F (36.8 C) (Oral)  Resp 26  SpO2 97%  Physical Exam General: Well-developed, well-nourished female in obvious discomfort; appears older than age of record HENT: normocephalic, atraumatic Eyes: pupils equal round and reactive to light; extraocular muscles intact Neck: supple Heart: regular rate and rhythm Lungs: clear to auscultation bilaterally Abdomen: soft; nondistended; left lower quadrant tenderness; left lower quad colostomy; bowel sounds decreased Extremities: No deformity; full range of motion; pulses normal; no edema Neurologic: Awake, alert and oriented;  motor function intact in all extremities and symmetric; no facial droop Skin: Warm and dry Psychiatric: Anxious; agitated    ED Course  Procedures (including critical care time)     MDM   Nursing notes and vitals signs, including pulse oximetry, reviewed.  Summary of this visit's results, reviewed by myself:  Labs:  Results for orders placed during the hospital encounter of 12/14/12 (from the past 24 hour(s))  URINALYSIS, ROUTINE W REFLEX MICROSCOPIC     Status: Abnormal   Collection Time    12/14/12  1:01 AM      Result Value Range   Color, Urine YELLOW  YELLOW   APPearance CLOUDY (*) CLEAR   Specific Gravity, Urine 1.024  1.005 - 1.030   pH 5.5  5.0 - 8.0   Glucose, UA NEGATIVE  NEGATIVE mg/dL   Hgb urine dipstick NEGATIVE  NEGATIVE   Bilirubin Urine NEGATIVE  NEGATIVE   Ketones, ur NEGATIVE  NEGATIVE mg/dL   Protein, ur NEGATIVE  NEGATIVE mg/dL   Urobilinogen, UA 0.2  0.0 - 1.0 mg/dL   Nitrite NEGATIVE  NEGATIVE   Leukocytes, UA NEGATIVE  NEGATIVE    Imaging Studies: Ct Abdomen Pelvis Wo Contrast  12/14/2012   *RADIOLOGY REPORT*  Clinical Data: Left flank pain.  CT ABDOMEN AND PELVIS WITHOUT CONTRAST  Technique:  Multidetector CT imaging of the abdomen and pelvis was performed following the standard protocol without intravenous contrast.  Comparison: 04/19/2012.  Findings: The lung bases are clear except for subsegmental atelectasis at the right lung base.  The unenhanced appearance of the liver is unremarkable.  No focal lesions.  Gallbladder is normal.  No common bile duct dilatation. The pancreas is normal.  The spleen is normal.  The adrenal glands and right kidney are normal.  A small left renal calculi but no hydronephrosis or hydroureter.  No obstructing ureteral calculi and no bladder calculi.  The stomach, duodenum, small bowel and colon are grossly normal without oral contrast.  Stable surgical changes with a left lower quadrant colostomy and parastomal hernia.   The aorta is normal in caliber.  There are small mesenteric and retroperitoneal lymph nodes but no mass or adenopathy.  The uterus and ovaries are unremarkable.  No pelvic mass or adenopathy.  Stable surgical changes.  The bony structures are unremarkable.  IMPRESSION:  1.  Left renal calculus but no obstructing ureteral calculi. 2.  Stable surgical changes involving the abdomen/pelvis with evidence of previous bowel surgery.  There is a left lower quadrant colostomy and parastomal hernia containing bowel.  No obstruction.   Original Report Authenticated By: Rudie Meyer, M.D.    2:16 AM She is asymptomatic at this time after IV medications. Patient's history and exam consistent with passed kidney stone.        Hanley Seamen, MD 12/14/12  0216 

## 2013-02-11 ENCOUNTER — Other Ambulatory Visit: Payer: Self-pay

## 2013-02-11 ENCOUNTER — Other Ambulatory Visit: Payer: Self-pay | Admitting: Family Medicine

## 2013-02-11 DIAGNOSIS — Z1231 Encounter for screening mammogram for malignant neoplasm of breast: Secondary | ICD-10-CM

## 2013-02-28 ENCOUNTER — Ambulatory Visit
Admission: RE | Admit: 2013-02-28 | Discharge: 2013-02-28 | Disposition: A | Discharge status: Home / Self Care | Payer: 59 | Source: Ambulatory Visit

## 2013-02-28 DIAGNOSIS — Z1231 Encounter for screening mammogram for malignant neoplasm of breast: Secondary | ICD-10-CM

## 2013-04-15 ENCOUNTER — Encounter: Payer: Self-pay | Admitting: Internal Medicine

## 2013-04-29 ENCOUNTER — Telehealth: Payer: Self-pay | Admitting: Internal Medicine

## 2013-04-29 NOTE — Telephone Encounter (Signed)
Pt called received recall letter for colon. Pt states she is switching her care to [facility].

## 2013-06-02 ENCOUNTER — Other Ambulatory Visit: Payer: Self-pay

## 2013-09-15 DIAGNOSIS — L271 Localized skin eruption due to drugs and medicaments taken internally: Secondary | ICD-10-CM | POA: Insufficient documentation

## 2014-04-21 ENCOUNTER — Other Ambulatory Visit: Payer: Self-pay

## 2014-04-21 DIAGNOSIS — Z1231 Encounter for screening mammogram for malignant neoplasm of breast: Secondary | ICD-10-CM

## 2014-05-01 ENCOUNTER — Ambulatory Visit: Payer: 59

## 2014-05-08 ENCOUNTER — Ambulatory Visit
Admission: RE | Admit: 2014-05-08 | Discharge: 2014-05-08 | Disposition: A | Discharge status: Home / Self Care | Payer: 59 | Source: Ambulatory Visit

## 2014-05-08 DIAGNOSIS — Z1231 Encounter for screening mammogram for malignant neoplasm of breast: Secondary | ICD-10-CM

## 2015-02-02 ENCOUNTER — Encounter: Payer: Self-pay | Admitting: Internal Medicine

## 2015-05-23 ENCOUNTER — Other Ambulatory Visit: Payer: Self-pay

## 2015-05-23 DIAGNOSIS — Z1231 Encounter for screening mammogram for malignant neoplasm of breast: Secondary | ICD-10-CM

## 2015-06-06 ENCOUNTER — Ambulatory Visit
Admission: RE | Admit: 2015-06-06 | Discharge: 2015-06-06 | Disposition: A | Discharge status: Home / Self Care | Payer: 59 | Source: Ambulatory Visit

## 2015-06-06 DIAGNOSIS — Z1231 Encounter for screening mammogram for malignant neoplasm of breast: Secondary | ICD-10-CM

## 2016-05-26 ENCOUNTER — Ambulatory Visit: Payer: Self-pay | Admitting: Surgery

## 2016-06-12 ENCOUNTER — Encounter: Payer: Self-pay | Admitting: Physical Therapy

## 2016-06-12 ENCOUNTER — Ambulatory Visit: Payer: 59 | Attending: Surgery | Admitting: Physical Therapy

## 2016-06-12 DIAGNOSIS — M6281 Muscle weakness (generalized): Secondary | ICD-10-CM

## 2016-06-12 DIAGNOSIS — R252 Cramp and spasm: Secondary | ICD-10-CM | POA: Diagnosis present

## 2016-06-12 DIAGNOSIS — R279 Unspecified lack of coordination: Secondary | ICD-10-CM | POA: Diagnosis present

## 2016-06-12 NOTE — Therapy (Signed)
[facility] [address] [place], [place], [address] Phone: [phone]   Fax:  [phone]  Physical Therapy Evaluation  Patient Details  Name: Emily Holloway MRN: [id number] Date of Birth: 10-Apr-1956 Referring Provider: Michael Boston  Encounter Date: 06/12/2016      PT End of Session - 06/12/16 1720    Visit Number 1   Date for PT Re-Evaluation 08/07/16   PT Start Time 1618   PT Stop Time 1701   PT Time Calculation (min) 43 min   Activity Tolerance Patient tolerated treatment well   Behavior During Therapy [facility] for tasks assessed/performed      Past Medical History:  Diagnosis Date  . Blisters with epidermal loss due to burn (second degree) of right lateral breast 04/14/2012  . Parastomal hernia s/p primary repair [id number] 03/10/2012  . Rectal carcinoma ([place])    T2N0 s/p lap LAR with coloanal anastomosis  . Rectovaginal fistula    recurrent    Past Surgical History:  Procedure Laterality Date  . COLON RESECTION  2010   Rectal cancer s/p lap LAR/coloanal anastomosis  . COLONOSCOPY W/ BIOPSIES AND POLYPECTOMY  04/09/2009   adenocarcinoma  . COLOSTOMY TAKEDOWN  03/25/2012   Procedure: LAPAROSCOPIC COLOSTOMY TAKEDOWN;  Surgeon: Adin Hector, MD;  [facility];  Service: General;  Laterality: N/A;  diagnostic laparoscopy,ileocecectomy, creation of colostomy  . EXAMINATION UNDER ANESTHESIA  03/25/2012   Procedure: EXAM UNDER ANESTHESIA;  Surgeon: Adin Hector, MD;  [facility];  Service: General;  Laterality: N/A;  recto vaginal fistula biopsy  . ILEOSTOMY CLOSURE  03/05/2012   Procedure: ILEOSTOMY TAKEDOWN;  Surgeon: Adin Hector, MD;  [facility];  Service: General;  Laterality: N/A;  . ostomy placement  2011   with RV fistula repair  . PARASTOMAL HERNIA REPAIR  03/05/2012   Procedure: HERNIA REPAIR PARASTOMAL;  Surgeon: Adin Hector, MD;  [facility];  Service: General;  Laterality:  N/A;  . RECTO-VAGINAL FISSURE REPAIR  10/23/2011   Procedure: REPAIR RECTO-VAGINAL FISTULA;  Surgeon: Adin Hector, MD;  [facility];  Service: General;  Laterality: N/A;  Repair of Rectovaginal Fistula with Pedicle Martius Flap with Dr Nicki Reaper MacDiarmid to co-surgeon   . RECTOVAGINAL FISTULA CLOSURE  [id number], [id number]  . TUBAL LIGATION      There were no vitals filed for this visit.       Subjective Assessment - 06/12/16 1621    Subjective Pt has history of colon cancer.  Currently has rectal vaginal fistula.  Has ileostomy.  In 2013, Dr. Johney Maine attempted to repair the fistula with a muscle flap.  Pt unable to have intercourse for 4 years.     Limitations Other (comment)   How long can you sit comfortably? not limited   How long can you stand comfortably? not limited   How long can you walk comfortably? not limited   Patient Stated Goals Pt would like to be able to have reduced discomfort for self care such as pap smears   Currently in Pain? No/denies  pain 6/10 during GYN pap exams   Multiple Pain Sites No            [facility] PT Assessment - 06/12/16 0001      Assessment   Medical Diagnosis N82.3 fistula of vagina to large intestine   Referring Provider Michael Boston   Onset Date/Surgical Date --  2013   Prior Therapy no     Precautions  Precautions None     Restrictions   Weight Bearing Restrictions No     Balance Screen   Has the patient fallen in the past 6 months No   Has the patient had a decrease in activity level because of a fear of falling?  No   Is the patient reluctant to leave their home because of a fear of falling?  No     Home Ecologist residence     Prior Function   Level of Independence Independent   Vocation Full time employment   Vocation Requirements sitting/desk work     Cognition   Overall Cognitive Status Within Functional Limits for tasks assessed     Observation/Other Assessments   Focus on  Therapeutic Outcomes (FOTO)  13%     Posture/Postural Control   Posture/Postural Control Postural limitations   Postural Limitations Rounded Shoulders;Increased thoracic kyphosis   Posture Comments guarded     AROM   Right Hip External Rotation  --  50% limitation   Right Hip Internal Rotation  --  25'% limitation   Left Hip External Rotation  --  50% limitation   Left Hip Internal Rotation  --  50% limitation   Lumbar Flexion --  50% limitation   Lumbar Extension --  25% limitation     Special Tests    Special Tests --     Ambulation/Gait   Gait Pattern Trendelenburg  Rt hip                 Pelvic Floor Special Questions - 06/12/16 0001    Prior Pelvic/Prostate Exam No   Currently Sexually Active No   Urinary Leakage No   Urinary frequency no   Fecal incontinence No   Skin Integrity Intact   Scar Restricted  lower abdomen   Perineal Body/Introitus  Elevated   External Palpation transverse peroneus tight but no tenderness with palpation   Pelvic Floor Internal Exam Patient was informed and agreed to exam   Exam Type Vaginal   Palpation obdurator internus bilateral tenderness Lt>Rt   Strength weak squeeze, no lift   Strength # of reps 1   Strength # of seconds 3                  PT Education - 06/12/16 1701    Education provided Yes   Education Details info on vaginal dilators, and meditation for muscle relaxation   Person(s) Educated Patient   Methods Explanation;Handout   Comprehension Verbalized understanding          PT Short Term Goals - 06/12/16 1744      PT SHORT TERM GOAL #1   Title independent with initial HEP    Time 4   Period Weeks   Status New     PT SHORT TERM GOAL #2   Title able to insert medium sized dilator without pain in order to demonstrate increased relaxation of pelvic floor for improved bladder function.   Time 4   Period Weeks   Status New     PT SHORT TERM GOAL #3   Title demonstrate ability to  relax pelvic floor after contraction in 1 second or less for greater muscle control during functional movements   Time 4   Period Weeks   Status New           PT Long Term Goals - 06/12/16 1747      PT LONG TERM GOAL #1   Title  independent with advanced HEP   Time 8   Period Weeks   Status New     PT LONG TERM GOAL #2   Title demonstrate bilateral hip internal and external rotation with 25% limitation for greater ability to resume functional activities   Time 8   Period Weeks   Status New     PT LONG TERM GOAL #3   Title able to insert large diameter dilator without pain in order to demonstrate ability to relax pelvic floor muscles for healthy bladder function and safely performing functional activities.   Time 8   Period Weeks   Status New     PT LONG TERM GOAL #4   Title demonstrate 3/5 pelvic floor contraction holding for >5 sec for improved pelvic stability during functional activities   Time 8   Period Weeks   Status New               Plan - 06/12/16 1721    Clinical Impression Statement Patient presents to PT for low complexity eval due to stable condition.  Pt has pelvic floor weakness of 2/5 and lack of endurance able to hold for 3 seconds.  Pt is experiencing discomfort and has been unable to participate in intercourse as well as has difficulty for routine exams with OB/GYN . Patient also presents with hip weakness demonstrated by Trendelenburg gait and on single leg stand Rt LE.  This places patient at increased risk for injury, bladder function, and may have difficulty with self care such as routine pelvic exams.  Pt has decreased lumbar and hip ROM by 25-50%.  This is limiting patients functional movements and places patient at increased risk for injury due to altered functional movements.  After multiple abdominal surgeries, pt also has scar tissue, fascial and muslce adhesions and spasms.  Pt will benefit from skilled PT to address impairements and safely  return patient to regular functional activities and intercourse with her spouse while safely maintaining skin integrity, and decreased pain.   Rehab Potential Excellent   Clinical Impairments Affecting Rehab Potential multiple abdominal surgeries including colorectal anastomosis, ileostomy, multiple attempts at fistula repair, wedge resection of the lung   PT Frequency 1x / week   PT Duration 8 weeks   PT Treatment/Interventions Biofeedback;Manual techniques;Therapeutic activities;Therapeutic exercise;Neuromuscular re-education;Patient/family education;Scar mobilization;Passive range of motion   PT Next Visit Plan myofascial release to bladder diaphragm, trigger point release pelvic floor, toileting techniques, hip strength assessed   PT Home Exercise Plan toileting techniques, progress as tolerated   Recommended Other Services none   Consulted and Agree with Plan of Care Patient      Patient will benefit from skilled therapeutic intervention in order to improve the following deficits and impairments:  Decreased range of motion, Decreased strength, Decreased scar mobility, Increased muscle spasms, Pain, Increased fascial restricitons  Visit Diagnosis: Cramp and spasm - Plan: PT plan of care cert/re-cert  Muscle weakness (generalized) - Plan: PT plan of care cert/re-cert  Unspecified lack of coordination - Plan: PT plan of care cert/re-cert     Problem List Patient Active Problem List   Diagnosis Date Noted  . Anemia in chronic illness 04/06/2012  . 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 IBS (irritable bowel syndrome) 03/16/2012  . History of rectal cancer, T2N0, Nov2010 03/27/2011  . Recurrent rectovaginal fistula, s/p re-diversion with end ileostomy Aug2013 02/03/2011    Zannie Cove, PT 06/12/2016, 6:16 PM  [facility] [address]  [place], [place], [address] Phone: [phone]   Fax:  [phone]  Name: Emily Holloway MRN: [id number] Date of Birth: 1956/04/17

## 2016-06-12 NOTE — Patient Instructions (Addendum)
  [email or web address]  YouTube:  [employer] - pelvic relaxation meditation   [facility] [address] [place], [place] [address] Phone # [phone] Fax [phone]  Zannie Cove, PT 06/12/16 4:59 PM

## 2016-06-23 ENCOUNTER — Other Ambulatory Visit: Payer: Self-pay | Admitting: Obstetrics & Gynecology

## 2016-06-23 DIAGNOSIS — Z1231 Encounter for screening mammogram for malignant neoplasm of breast: Secondary | ICD-10-CM

## 2016-06-26 ENCOUNTER — Ambulatory Visit: Payer: 59 | Admitting: Physical Therapy

## 2016-06-26 ENCOUNTER — Encounter: Payer: Self-pay | Admitting: Physical Therapy

## 2016-06-26 DIAGNOSIS — R252 Cramp and spasm: Secondary | ICD-10-CM

## 2016-06-26 DIAGNOSIS — M6281 Muscle weakness (generalized): Secondary | ICD-10-CM

## 2016-06-26 DIAGNOSIS — R279 Unspecified lack of coordination: Secondary | ICD-10-CM

## 2016-06-26 NOTE — Therapy (Signed)
[facility] [address], [place], [place], [address] Phone: [phone]   Fax:  [phone]  Physical Therapy Treatment  Patient Details  Name: Emily Holloway MRN: [id number] Date of Birth: 11-26-1955 Referring Provider: Michael Boston  Encounter Date: 06/26/2016      PT End of Session - 06/26/16 1725    Visit Number 2   Date for PT Re-Evaluation 08/07/16   PT Start Time 1618   PT Stop Time 1705   PT Time Calculation (min) 47 min   Activity Tolerance Patient tolerated treatment well   Behavior During Therapy [facility] for tasks assessed/performed      Past Medical History:  Diagnosis Date  . Blisters with epidermal loss due to burn (second degree) of right lateral breast 04/14/2012  . Parastomal hernia s/p primary repair [id number] 03/10/2012  . Rectal carcinoma ([place])    T2N0 s/p lap LAR with coloanal anastomosis  . Rectovaginal fistula    recurrent    Past Surgical History:  Procedure Laterality Date  . COLON RESECTION  2010   Rectal cancer s/p lap LAR/coloanal anastomosis  . COLONOSCOPY W/ BIOPSIES AND POLYPECTOMY  04/09/2009   adenocarcinoma  . COLOSTOMY TAKEDOWN  03/25/2012   Procedure: LAPAROSCOPIC COLOSTOMY TAKEDOWN;  Surgeon: Adin Hector, MD;  [facility];  Service: General;  Laterality: N/A;  diagnostic laparoscopy,ileocecectomy, creation of colostomy  . EXAMINATION UNDER ANESTHESIA  03/25/2012   Procedure: EXAM UNDER ANESTHESIA;  Surgeon: Adin Hector, MD;  [facility];  Service: General;  Laterality: N/A;  recto vaginal fistula biopsy  . ILEOSTOMY CLOSURE  03/05/2012   Procedure: ILEOSTOMY TAKEDOWN;  Surgeon: Adin Hector, MD;  [facility];  Service: General;  Laterality: N/A;  . ostomy placement  2011   with RV fistula repair  . PARASTOMAL HERNIA REPAIR  03/05/2012   Procedure: HERNIA REPAIR PARASTOMAL;  Surgeon: Adin Hector, MD;  [facility];  Service: General;  Laterality:  N/A;  . RECTO-VAGINAL FISSURE REPAIR  10/23/2011   Procedure: REPAIR RECTO-VAGINAL FISTULA;  Surgeon: Adin Hector, MD;  [facility];  Service: General;  Laterality: N/A;  Repair of Rectovaginal Fistula with Pedicle Martius Flap with Dr Nicki Reaper MacDiarmid to co-surgeon   . RECTOVAGINAL FISTULA CLOSURE  [id number], [id number]  . TUBAL LIGATION      There were no vitals filed for this visit.      Subjective Assessment - 06/26/16 1727    Subjective Pt states that she ordered and will be receiving the dilators very soon.  States she has not had attempted any vaginal penetration since previous visit.     Currently in Pain? No/denies                         [facility] Adult PT Treatment/Exercise - 06/26/16 0001      Manual Therapy   Manual Therapy Soft tissue mobilization;Myofascial release   Manual therapy comments pt was informed and gave consent to do pelvic floor soft tissue mobilizaion internally via vaginal canal   Soft tissue mobilization bulbo spongeosis, obdurator inturnus bilaterally   Myofascial Release abdominal scar tissue and myofascial to bladder diaphragm                  PT Short Term Goals - 06/26/16 1724      PT SHORT TERM GOAL #1   Title independent with initial HEP    Time 4   Period Weeks  Status On-going     PT SHORT TERM GOAL #2   Title able to insert medium sized dilator without pain in order to demonstrate increased relaxation of pelvic floor for improved bladder function.   Time 4   Period Weeks   Status On-going     PT SHORT TERM GOAL #3   Title demonstrate ability to relax pelvic floor after contraction in 1 second or less for greater muscle control during functional movements   Time 4   Period Weeks   Status On-going           PT Long Term Goals - 06/12/16 1747      PT LONG TERM GOAL #1   Title independent with advanced HEP   Time 8   Period Weeks   Status New     PT LONG TERM GOAL #2   Title demonstrate  bilateral hip internal and external rotation with 25% limitation for greater ability to resume functional activities   Time 8   Period Weeks   Status New     PT LONG TERM GOAL #3   Title able to insert large diameter dilator without pain in order to demonstrate ability to relax pelvic floor muscles for healthy bladder function and safely performing functional activities.   Time 8   Period Weeks   Status New     PT LONG TERM GOAL #4   Title demonstrate 3/5 pelvic floor contraction holding for >5 sec for improved pelvic stability during functional activities   Time 8   Period Weeks   Status New               Plan - 06/26/16 1709    Clinical Impression Statement Pt continues to have muscle spasming and scar tissue adhesions in pelvic floor and lower abdomen.  Pt had reduced sensitivity with manual today.  Pt will benefit on skilled PT to continue with manual and biofeedback in order to regain muscle pliability and strength for improved functional activities.   Rehab Potential Excellent   Clinical Impairments Affecting Rehab Potential multiple abdominal surgeries including colorectal anastomosis, ileostomy, multiple attempts at fistula repair, wedge resection of the lung   PT Frequency 1x / week   PT Duration 8 weeks   PT Treatment/Interventions Biofeedback;Manual techniques;Therapeutic activities;Therapeutic exercise;Neuromuscular re-education;Patient/family education;Scar mobilization;Passive range of motion   PT Next Visit Plan biofeedback and manual soft tissue release to pelvic floor   PT Home Exercise Plan dilators   Consulted and Agree with Plan of Care Patient      Patient will benefit from skilled therapeutic intervention in order to improve the following deficits and impairments:  Decreased range of motion, Decreased strength, Decreased scar mobility, Increased muscle spasms, Pain, Increased fascial restricitons  Visit Diagnosis: Cramp and spasm  Muscle weakness  (generalized)  Unspecified lack of coordination     Problem List Patient Active Problem List   Diagnosis Date Noted  . Anemia in chronic illness 04/06/2012  . 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 IBS (irritable bowel syndrome) 03/16/2012  . History of rectal cancer, T2N0, Nov2010 03/27/2011  . Recurrent rectovaginal fistula, s/p re-diversion with end ileostomy Aug2013 02/03/2011    Zannie Cove, PT 06/26/2016, 5:33 PM  [facility] [address] [place], [place], [address] Phone: [phone]   Fax:  [phone]  Name: Emily Holloway MRN: [id number] Date of Birth: [birth year]

## 2016-07-03 ENCOUNTER — Encounter: Payer: Self-pay | Admitting: Physical Therapy

## 2016-07-03 ENCOUNTER — Ambulatory Visit: Payer: 59 | Attending: Surgery | Admitting: Physical Therapy

## 2016-07-03 DIAGNOSIS — R279 Unspecified lack of coordination: Secondary | ICD-10-CM | POA: Insufficient documentation

## 2016-07-03 DIAGNOSIS — M6281 Muscle weakness (generalized): Secondary | ICD-10-CM | POA: Diagnosis present

## 2016-07-03 DIAGNOSIS — R252 Cramp and spasm: Secondary | ICD-10-CM | POA: Diagnosis present
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
## 2016-07-10 ENCOUNTER — Encounter: Payer: Self-pay | Admitting: Physical Therapy

## 2016-07-10 ENCOUNTER — Ambulatory Visit: Payer: 59 | Admitting: Physical Therapy

## 2016-07-10 DIAGNOSIS — M6281 Muscle weakness (generalized): Secondary | ICD-10-CM

## 2016-07-10 DIAGNOSIS — R252 Cramp and spasm: Secondary | ICD-10-CM

## 2016-07-10 DIAGNOSIS — R279 Unspecified lack of coordination: Secondary | ICD-10-CM
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
## 2016-07-17 ENCOUNTER — Ambulatory Visit: Payer: 59 | Admitting: Physical Therapy

## 2016-07-17 ENCOUNTER — Encounter: Payer: Self-pay | Admitting: Physical Therapy

## 2016-07-17 DIAGNOSIS — R252 Cramp and spasm: Secondary | ICD-10-CM | POA: Diagnosis not present

## 2016-07-17 DIAGNOSIS — R279 Unspecified lack of coordination: Secondary | ICD-10-CM

## 2016-07-17 DIAGNOSIS — M6281 Muscle weakness (generalized): Secondary | ICD-10-CM
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
## 2016-07-18 ENCOUNTER — Ambulatory Visit
Admission: RE | Admit: 2016-07-18 | Discharge: 2016-07-18 | Disposition: A | Discharge status: Home / Self Care | Payer: 59 | Source: Ambulatory Visit | Attending: Obstetrics & Gynecology | Admitting: Obstetrics & Gynecology

## 2016-07-18 DIAGNOSIS — Z1231 Encounter for screening mammogram for malignant neoplasm of breast: Secondary | ICD-10-CM

## 2016-07-24 ENCOUNTER — Ambulatory Visit: Payer: 59 | Admitting: Physical Therapy

## 2016-07-24 ENCOUNTER — Encounter: Payer: Self-pay | Admitting: Physical Therapy

## 2016-07-24 DIAGNOSIS — R252 Cramp and spasm: Secondary | ICD-10-CM

## 2016-07-24 DIAGNOSIS — M6281 Muscle weakness (generalized): Secondary | ICD-10-CM

## 2016-07-24 DIAGNOSIS — R279 Unspecified lack of coordination: Secondary | ICD-10-CM
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
## 2016-07-24 NOTE — Patient Instructions (Signed)
  Position yourself as shown grabbing onto feet or behind the knees. You should feel a gentle stretch. Breathe in and allow the pelvic floor muscles to relax. Hold 1 min. 2 times per day.  Adductors, Frog Squat    Crouch with elbows inside knees . Gently push knees outward. Hold _30__ seconds.1 Repeat __1_ times per session. Do _2__ sessions per day.  Copyright  VHI. All rights reserved.   BACK: Child's Pose (Sciatica)    Sit in knee-chest position and reach arms forward. Separate knees for comfort. Hold position for _60__ breaths. Repeat _2__ times. Do _2__ times per day.  Copyright  VHI. All rights reserved.   Piriformis Stretch    Lying on back, pull right knee toward opposite shoulder. Hold _30___ seconds. Repeat __2__ times. Do _1___ sessions per day.  http://gt2.exer.us/258   Copyright  VHI. All rights reserved.   Piriformis Stretch, Supine    Lie supine, one ankle crossed onto opposite knee. Holding bottom leg behind knee, gently pull legs toward chest until stretch is felt in buttock of top leg. Hold _30__ seconds. For deeper stretch gently push top knee away from body.  Repeat _2__ times per session. Do _2__ sessions per day.  Copyright  VHI. All rights reserved.    Supine Knee-to-Chest, Unilateral    Lie on back, hands clasped behind one knee. Pull knee in toward chest until a comfortable stretch is felt in lower back and buttocks. Hold 30___ seconds.  Repeat __2_ times per session. Do _1__ sessions per day.  Copyright  VHI. All rights reserved.  Supine With Rotation    Lie on back with one knee drawn toward chest. Slowly bring bent leg across body until stretch is felt in lower back area. Hold _30__ seconds. Repeat to other side. Repeat _2__ times per session. Do __2_ sessions per day.  Copyright  VHI. All rights reserved.  Butterfly, Supine    Lie on back, feet together. Lower knees toward floor. Hold 60___ seconds. Repeat _2__ times per  session. Do _1__ sessions per day.  Copyright  VHI. All rights reserved.     

## 2016-08-06 ENCOUNTER — Encounter: Payer: 59 | Admitting: Physical Therapy

## 2016-08-07 ENCOUNTER — Encounter: Payer: 59 | Admitting: Physical Therapy

## 2016-08-14 ENCOUNTER — Ambulatory Visit: Payer: 59 | Admitting: Physical Therapy

## 2016-08-21 ENCOUNTER — Ambulatory Visit: Payer: 59 | Admitting: Physical Therapy

## 2016-08-28 ENCOUNTER — Ambulatory Visit: Payer: 59 | Attending: Surgery | Admitting: Physical Therapy

## 2016-08-28 DIAGNOSIS — R252 Cramp and spasm: Secondary | ICD-10-CM

## 2016-08-28 DIAGNOSIS — M6281 Muscle weakness (generalized): Secondary | ICD-10-CM | POA: Diagnosis present

## 2016-08-28 DIAGNOSIS — R279 Unspecified lack of coordination: Secondary | ICD-10-CM | POA: Diagnosis present

## 2016-08-28 NOTE — Patient Instructions (Signed)
Lubrication . Used for intercourse to reduce friction . Avoid ones that have glycerin, warming gels, tingling gels, icing or cooling gel, scented . May need to be reapplied once or several times during sexual activity . Can be applied to both partners genitals prior to vaginal penetration to minimize friction or irritation . Prevent irritation and mucosal tears that cause post coital pain and increased the risk of vaginal and urinary tract infections . Oil-based lubricants cannot be used with condoms due to breaking them down.  Least likely to irritate vaginal tissue.  . Plant based-lubes are safe . Silicone-based lubrication are thicker and last long and used for post-menopausal women Types of Lubricants . Good Clean Love (water based)-[employer], Target, [employer], [employer] . Slippery Stuff(water based) [employer] . Sylk (water based) [employer], [place]- drug store; www.blossom-organics.com . Samul Dada- Drug store . Coconut oil- will breakdown condoms, least irritating . Aloe Vera- least irritating . Sliquid Natural H20 (water based)-[employer], good if frequent UTI's . Wet Platinum- (Silicone) Target, [employer] . Yes [place] . [place] Jelly, Replens, and Astroglide kills good Bacteria (lactobadilli)  Desert [employer] and [employer]  Things to avoid in the vaginal area . Do not use things to irritate the vulvar area . No lotions . No soaps; can use Aveeno or Calendula cleanser if needed. Must be gentle . No deodorants . No douches . Good to sleep without underwear to let the vaginal area to air out . No scrubbing: spread the lips to let warm water rinse over labias and pat dry
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
## 2017-07-22 ENCOUNTER — Other Ambulatory Visit: Payer: Self-pay | Admitting: Family Medicine

## 2017-07-22 ENCOUNTER — Other Ambulatory Visit: Payer: Self-pay | Admitting: Obstetrics & Gynecology

## 2017-07-22 DIAGNOSIS — Z1231 Encounter for screening mammogram for malignant neoplasm of breast: Secondary | ICD-10-CM

## 2017-08-14 ENCOUNTER — Ambulatory Visit
Admission: RE | Admit: 2017-08-14 | Discharge: 2017-08-14 | Disposition: A | Discharge status: Home / Self Care | Payer: 59 | Source: Ambulatory Visit | Attending: Family Medicine | Admitting: Family Medicine

## 2017-08-14 DIAGNOSIS — Z1231 Encounter for screening mammogram for malignant neoplasm of breast: Secondary | ICD-10-CM

## 2017-09-09 ENCOUNTER — Encounter: Payer: Self-pay | Admitting: Obstetrics & Gynecology

## 2017-09-09 ENCOUNTER — Ambulatory Visit (INDEPENDENT_AMBULATORY_CARE_PROVIDER_SITE_OTHER): Payer: 59 | Admitting: Obstetrics & Gynecology

## 2017-09-09 VITALS — BP 114/78 | Ht 61.0 in | Wt 167.0 lb

## 2017-09-09 DIAGNOSIS — Z780 Asymptomatic menopausal state: Secondary | ICD-10-CM

## 2017-09-09 DIAGNOSIS — Z01419 Encounter for gynecological examination (general) (routine) without abnormal findings: Secondary | ICD-10-CM | POA: Diagnosis not present

## 2017-09-09 DIAGNOSIS — Z85048 Personal history of other malignant neoplasm of rectum, rectosigmoid junction, and anus: Secondary | ICD-10-CM

## 2017-09-09 DIAGNOSIS — Z13820 Encounter for screening for osteoporosis: Secondary | ICD-10-CM | POA: Diagnosis not present

## 2017-09-09 NOTE — Progress Notes (Signed)
Emily Holloway December 27, 1955 [id number]   History:    62 y.o. G0 Married  RP:  Established patient presenting for annual gyn exam   HPI: Menopause, well on no HRT.  No PMB.  No pelvic pain.  Abstinent because husband is concerned about risks of recurrent vaginal rectal fistula.  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  Followed by Oncologist.  Colonoscopy 2018 at [facility].  Has a Colostomy bag, will not reverse.  Urine normal.  Breasts wnl.  Health labs with family physician.  1123 were underestimated as well he is already have our meeting  Past medical history,surgical history, family history and social history were all reviewed and documented in the EPIC chart.  Gynecologic History No LMP recorded. Patient is postmenopausal. Contraception: post menopausal status Last Pap: 07/2016. Results were: negative/HPV HR neg Last mammogram: 07/2017. Results were: Negative Bone Density: 2010 Osteopenia, will schedule here now Colonoscopy: 2018 [facility]  Obstetric History OB History  Gravida Para Term Preterm AB Living  0 0 0 0 0 0  SAB TAB Ectopic Multiple Live Births  0 0 0 0 0         ROS: A ROS was performed and pertinent positives and negatives are included in the history.  GENERAL: No fevers or chills. HEENT: No change in vision, no earache, sore throat or sinus congestion. NECK: No pain or stiffness. CARDIOVASCULAR: No chest pain or pressure. No palpitations. PULMONARY: No shortness of breath, cough or wheeze. GASTROINTESTINAL: No abdominal pain, nausea, vomiting or diarrhea, melena or bright red blood per rectum. GENITOURINARY: No urinary frequency, urgency, hesitancy or dysuria. MUSCULOSKELETAL: No joint or muscle pain, no back pain, no recent trauma. DERMATOLOGIC: No rash, no itching, no lesions. ENDOCRINE: No polyuria, polydipsia, no heat or cold intolerance. No recent change in weight. HEMATOLOGICAL: No anemia or easy bruising or  bleeding. NEUROLOGIC: No headache, seizures, numbness, tingling or weakness. PSYCHIATRIC: No depression, no loss of interest in normal activity or change in sleep pattern.     Exam:   BP 114/78   Ht 5\' 1"  (1.549 m)   Wt 167 lb (75.8 kg)   BMI 31.55 kg/m   Body mass index is 31.55 kg/m.  General appearance : Well developed well nourished female. No acute distress HEENT: Eyes: no retinal hemorrhage or exudates,  Neck supple, trachea midline, no carotid bruits, no thyroidmegaly Lungs: Clear to auscultation, no rhonchi or wheezes, or rib retractions  Heart: Regular rate and rhythm, no murmurs or gallops Breast:Examined in sitting and supine position were symmetrical in appearance, no palpable masses or tenderness,  no skin retraction, no nipple inversion, no nipple discharge, no skin discoloration, no axillary or supraclavicular lymphadenopathy Abdomen: no palpable masses or tenderness, no rebound or guarding Extremities: no edema or skin discoloration or tenderness  Pelvic: Vulva: Normal             Vagina: No gross lesions or discharge  Cervix: No gross lesions or discharge.  Pap reflex done.  Uterus  AV, normal size, shape and consistency, non-tender and mobile  Adnexa  Without masses or tenderness  Anus: Normal   Assessment/Plan:  62 y.o. female for annual exam   1. Encounter for routine gynecological examination with Papanicolaou smear of cervix Normal gynecologic exam.  Pap reflex done.  Breast exam normal.  Screening mammogram January 2019 was negative.  Health labs with family physician.  Recommend regular physical activity. - Pap IG w/ reflex  to HPV when ASC-U  2. Menopause present Well on no hormone replacement therapy.  No postmenopausal bleeding.  3. History of rectal cancer, T2N0, Nov2010 In remission.  Will not try to reanastomose the bowels anymore, well with ileostomy bag.  History of vaginal rectal fistulas repaired.  4. Screening for osteoporosis Vitamin D  supplements, calcium rich nutrition and regular weightbearing physical activity recommended.  Schedule bone density here. -Bone Density  Princess Bruins MD, 4:14 PM 09/09/2017

## 2017-09-12 LAB — PAP IG W/ RFLX HPV ASCU

## 2017-09-13 ENCOUNTER — Encounter: Payer: Self-pay | Admitting: Obstetrics & Gynecology

## 2017-09-13 NOTE — Patient Instructions (Signed)
1. Encounter for routine gynecological examination with Papanicolaou smear of cervix Normal gynecologic exam.  Pap reflex done.  Breast exam normal.  Screening mammogram January 2019 was negative.  Health labs with family physician.  Recommend regular physical activity. - Pap IG w/ reflex to HPV when ASC-[facility]  2. Menopause present Well on no hormone replacement therapy.  No postmenopausal bleeding.  3. History of rectal cancer, T2N0, Nov2010 In remission.  Will not try to reanastomose the bowels anymore, well with ileostomy bag.  History of vaginal rectal fistulas repaired.  4. Screening for osteoporosis Vitamin D supplements, calcium rich nutrition and regular weightbearing physical activity recommended.  Schedule bone density here. -Bone Density  Emily Holloway, it was a pleasure seeing you today!  I will inform you of your results as soon as they are available.   Health Maintenance for Postmenopausal Women Menopause is a normal process in which your reproductive ability comes to an end. This process happens gradually over a span of months to years, usually between the ages of 43 and 3. Menopause is complete when you have missed 12 consecutive menstrual periods. It is important to talk with your health care provider about some of the most common conditions that affect postmenopausal women, such as heart disease, cancer, and bone loss (osteoporosis). Adopting a healthy lifestyle and getting preventive care can help to promote your health and wellness. Those actions can also lower your chances of developing some of these common conditions. What should I know about menopause? During menopause, you may experience a number of symptoms, such as:  Moderate-to-severe hot flashes.  Night sweats.  Decrease in sex drive.  Mood swings.  Headaches.  Tiredness.  Irritability.  Memory problems.  Insomnia.  Choosing to treat or not to treat menopausal changes is an individual decision that you make  with your health care provider. What should I know about hormone replacement therapy and supplements? Hormone therapy products are effective for treating symptoms that are associated with menopause, such as hot flashes and night sweats. Hormone replacement carries certain risks, especially as you become older. If you are thinking about using estrogen or estrogen with progestin treatments, discuss the benefits and risks with your health care provider. What should I know about heart disease and stroke? Heart disease, heart attack, and stroke become more likely as you age. This may be due, in part, to the hormonal changes that your body experiences during menopause. These can affect how your body processes dietary fats, triglycerides, and cholesterol. Heart attack and stroke are both medical emergencies. There are many things that you can do to help prevent heart disease and stroke:  Have your blood pressure checked at least every 1-2 years. High blood pressure causes heart disease and increases the risk of stroke.  If you are 70-89 years old, ask your health care provider if you should take aspirin to prevent a heart attack or a stroke.  Do not use any tobacco products, including cigarettes, chewing tobacco, or electronic cigarettes. If you need help quitting, ask your health care provider.  It is important to eat a healthy diet and maintain a healthy weight. ? Be sure to include plenty of vegetables, fruits, low-fat dairy products, and lean protein. ? Avoid eating foods that are high in solid fats, added sugars, or salt (sodium).  Get regular exercise. This is one of the most important things that you can do for your health. ? Try to exercise for at least 150 minutes each week. The type of  exercise that you do should increase your heart rate and make you sweat. This is known as moderate-intensity exercise. ? Try to do strengthening exercises at least twice each week. Do these in addition to the  moderate-intensity exercise.  Know your numbers.Ask your health care provider to check your cholesterol and your blood glucose. Continue to have your blood tested as directed by your health care provider.  What should I know about cancer screening? There are several types of cancer. Take the following steps to reduce your risk and to catch any cancer development as early as possible. Breast Cancer  Practice breast self-awareness. ? This means understanding how your breasts normally appear and feel. ? It also means doing regular breast self-exams. Let your health care provider know about any changes, no matter how small.  If you are 12 or older, have a clinician do a breast exam (clinical breast exam or CBE) every year. Depending on your age, family history, and medical history, it may be recommended that you also have a yearly breast X-ray (mammogram).  If you have a family history of breast cancer, talk with your health care provider about genetic screening.  If you are at high risk for breast cancer, talk with your health care provider about having an MRI and a mammogram every year.  Breast cancer (BRCA) gene test is recommended for women who have family members with BRCA-related cancers. Results of the assessment will determine the need for genetic counseling and BRCA1 and for BRCA2 testing. BRCA-related cancers include these types: ? Breast. This occurs in males or females. ? Ovarian. ? Tubal. This may also be called fallopian tube cancer. ? Cancer of the abdominal or pelvic lining (peritoneal cancer). ? Prostate. ? Pancreatic.  Cervical, Uterine, and Ovarian Cancer Your health care provider may recommend that you be screened regularly for cancer of the pelvic organs. These include your ovaries, uterus, and vagina. This screening involves a pelvic exam, which includes checking for microscopic changes to the surface of your cervix (Pap test).  For women ages 21-65, health care  providers may recommend a pelvic exam and a Pap test every three years. For women ages 44-65, they may recommend the Pap test and pelvic exam, combined with testing for human papilloma virus (HPV), every five years. Some types of HPV increase your risk of cervical cancer. Testing for HPV may also be done on women of any age who have unclear Pap test results.  Other health care providers may not recommend any screening for nonpregnant women who are considered low risk for pelvic cancer and have no symptoms. Ask your health care provider if a screening pelvic exam is right for you.  If you have had past treatment for cervical cancer or a condition that could lead to cancer, you need Pap tests and screening for cancer for at least 20 years after your treatment. If Pap tests have been discontinued for you, your risk factors (such as having a new sexual partner) need to be reassessed to determine if you should start having screenings again. Some women have medical problems that increase the chance of getting cervical cancer. In these cases, your health care provider may recommend that you have screening and Pap tests more often.  If you have a family history of uterine cancer or ovarian cancer, talk with your health care provider about genetic screening.  If you have vaginal bleeding after reaching menopause, tell your health care provider.  There are currently no reliable tests available  to screen for ovarian cancer.  Lung Cancer Lung cancer screening is recommended for adults 84-32 years old who are at high risk for lung cancer because of a history of smoking. A yearly low-dose CT scan of the lungs is recommended if you:  Currently smoke.  Have a history of at least 30 pack-years of smoking and you currently smoke or have quit within the past 15 years. A pack-year is smoking an average of one pack of cigarettes per day for one year.  Yearly screening should:  Continue until it has been 15 years  since you quit.  Stop if you develop a health problem that would prevent you from having lung cancer treatment.  Colorectal Cancer  This type of cancer can be detected and can often be prevented.  Routine colorectal cancer screening usually begins at age 8 and continues through age 82.  If you have risk factors for colon cancer, your health care provider may recommend that you be screened at an earlier age.  If you have a family history of colorectal cancer, talk with your health care provider about genetic screening.  Your health care provider may also recommend using home test kits to check for hidden blood in your stool.  A small camera at the end of a tube can be used to examine your colon directly (sigmoidoscopy or colonoscopy). This is done to check for the earliest forms of colorectal cancer.  Direct examination of the colon should be repeated every 5-10 years until age 61. However, if early forms of precancerous polyps or small growths are found or if you have a family history or genetic risk for colorectal cancer, you may need to be screened more often.  Skin Cancer  Check your skin from head to toe regularly.  Monitor any moles. Be sure to tell your health care provider: ? About any new moles or changes in moles, especially if there is a change in a mole's shape or color. ? If you have a mole that is larger than the size of a pencil eraser.  If any of your family members has a history of skin cancer, especially at a young age, talk with your health care provider about genetic screening.  Always use sunscreen. Apply sunscreen liberally and repeatedly throughout the day.  Whenever you are outside, protect yourself by wearing long sleeves, pants, a wide-brimmed hat, and sunglasses.  What should I know about osteoporosis? Osteoporosis is a condition in which bone destruction happens more quickly than new bone creation. After menopause, you may be at an increased risk for  osteoporosis. To help prevent osteoporosis or the bone fractures that can happen because of osteoporosis, the following is recommended:  If you are 3-50 years old, get at least 1,000 mg of calcium and at least 600 mg of vitamin D per day.  If you are older than age 43 but younger than age 40, get at least 1,200 mg of calcium and at least 600 mg of vitamin D per day.  If you are older than age 25, get at least 1,200 mg of calcium and at least 800 mg of vitamin D per day.  Smoking and excessive alcohol intake increase the risk of osteoporosis. Eat foods that are rich in calcium and vitamin D, and do weight-bearing exercises several times each week as directed by your health care provider. What should I know about how menopause affects my mental health? Depression may occur at any age, but it is more common as  you become older. Common symptoms of depression include:  Low or sad mood.  Changes in sleep patterns.  Changes in appetite or eating patterns.  Feeling an overall lack of motivation or enjoyment of activities that you previously enjoyed.  Frequent crying spells.  Talk with your health care provider if you think that you are experiencing depression. What should I know about immunizations? It is important that you get and maintain your immunizations. These include:  Tetanus, diphtheria, and pertussis (Tdap) booster vaccine.  Influenza every year before the flu season begins.  Pneumonia vaccine.  Shingles vaccine.  Your health care provider may also recommend other immunizations. This information is not intended to replace advice given to you by your health care provider. Make sure you discuss any questions you have with your health care provider. Document Released: 09/05/2005 Document Revised: 02/01/2016 Document Reviewed: 04/17/2015 Elsevier Interactive Patient Education  2018 [employer].

## 2017-10-14 ENCOUNTER — Other Ambulatory Visit: Payer: Self-pay | Admitting: Gynecology

## 2017-10-14 DIAGNOSIS — Z13820 Encounter for screening for osteoporosis: Secondary | ICD-10-CM

## 2017-10-26 DIAGNOSIS — M8580 Other specified disorders of bone density and structure, unspecified site: Secondary | ICD-10-CM

## 2017-10-26 HISTORY — DX: Other specified disorders of bone density and structure, unspecified site: M85.80

## 2017-10-28 ENCOUNTER — Other Ambulatory Visit: Payer: Self-pay | Admitting: Gynecology

## 2017-10-28 ENCOUNTER — Ambulatory Visit (INDEPENDENT_AMBULATORY_CARE_PROVIDER_SITE_OTHER): Payer: 59

## 2017-10-28 DIAGNOSIS — M8589 Other specified disorders of bone density and structure, multiple sites: Secondary | ICD-10-CM | POA: Diagnosis not present

## 2017-10-28 DIAGNOSIS — Z13820 Encounter for screening for osteoporosis: Secondary | ICD-10-CM

## 2017-10-29 ENCOUNTER — Encounter: Payer: Self-pay | Admitting: Gynecology

## 2018-06-21 ENCOUNTER — Encounter (INDEPENDENT_AMBULATORY_CARE_PROVIDER_SITE_OTHER): Payer: 59 | Admitting: Ophthalmology

## 2018-06-21 DIAGNOSIS — H35372 Puckering of macula, left eye: Secondary | ICD-10-CM

## 2018-06-21 DIAGNOSIS — H2513 Age-related nuclear cataract, bilateral: Secondary | ICD-10-CM

## 2018-06-21 DIAGNOSIS — H33022 Retinal detachment with multiple breaks, left eye: Secondary | ICD-10-CM

## 2018-06-21 DIAGNOSIS — H43813 Vitreous degeneration, bilateral: Secondary | ICD-10-CM | POA: Diagnosis not present

## 2018-06-22 ENCOUNTER — Encounter (INDEPENDENT_AMBULATORY_CARE_PROVIDER_SITE_OTHER): Payer: 59 | Admitting: Ophthalmology

## 2018-06-29 ENCOUNTER — Encounter (INDEPENDENT_AMBULATORY_CARE_PROVIDER_SITE_OTHER): Payer: 59 | Admitting: Ophthalmology

## 2018-07-02 ENCOUNTER — Encounter (INDEPENDENT_AMBULATORY_CARE_PROVIDER_SITE_OTHER): Payer: 59 | Admitting: Ophthalmology

## 2018-07-02 DIAGNOSIS — H35372 Puckering of macula, left eye: Secondary | ICD-10-CM

## 2018-07-02 DIAGNOSIS — H33302 Unspecified retinal break, left eye: Secondary | ICD-10-CM

## 2018-08-13 ENCOUNTER — Other Ambulatory Visit: Payer: Self-pay | Admitting: Family Medicine

## 2018-08-13 DIAGNOSIS — Z1231 Encounter for screening mammogram for malignant neoplasm of breast: Secondary | ICD-10-CM

## 2018-09-13 ENCOUNTER — Ambulatory Visit
Admission: RE | Admit: 2018-09-13 | Discharge: 2018-09-13 | Disposition: A | Discharge status: Home / Self Care | Payer: BLUE CROSS/BLUE SHIELD | Source: Ambulatory Visit | Attending: Family Medicine | Admitting: Family Medicine

## 2018-09-13 DIAGNOSIS — Z1231 Encounter for screening mammogram for malignant neoplasm of breast: Secondary | ICD-10-CM | POA: Diagnosis not present

## 2018-11-03 ENCOUNTER — Encounter: Payer: 59 | Admitting: Obstetrics & Gynecology

## 2018-11-12 ENCOUNTER — Other Ambulatory Visit: Payer: Self-pay

## 2018-11-12 ENCOUNTER — Encounter (INDEPENDENT_AMBULATORY_CARE_PROVIDER_SITE_OTHER): Payer: 59 | Admitting: Ophthalmology

## 2018-11-12 DIAGNOSIS — H33302 Unspecified retinal break, left eye: Secondary | ICD-10-CM | POA: Diagnosis not present

## 2018-11-12 DIAGNOSIS — H2513 Age-related nuclear cataract, bilateral: Secondary | ICD-10-CM | POA: Diagnosis not present

## 2018-11-12 DIAGNOSIS — H35372 Puckering of macula, left eye: Secondary | ICD-10-CM

## 2018-11-12 DIAGNOSIS — H5213 Myopia, bilateral: Secondary | ICD-10-CM | POA: Diagnosis not present

## 2018-11-12 DIAGNOSIS — H43813 Vitreous degeneration, bilateral: Secondary | ICD-10-CM | POA: Diagnosis not present

## 2018-12-10 DIAGNOSIS — Z932 Ileostomy status: Secondary | ICD-10-CM | POA: Diagnosis not present

## 2019-01-05 ENCOUNTER — Other Ambulatory Visit: Payer: Self-pay

## 2019-01-06 ENCOUNTER — Encounter: Payer: BC Managed Care – PPO | Admitting: Obstetrics & Gynecology

## 2019-01-07 ENCOUNTER — Encounter: Payer: Self-pay | Admitting: Obstetrics & Gynecology

## 2019-01-07 ENCOUNTER — Other Ambulatory Visit: Payer: Self-pay

## 2019-01-07 ENCOUNTER — Ambulatory Visit (INDEPENDENT_AMBULATORY_CARE_PROVIDER_SITE_OTHER): Payer: BC Managed Care – PPO | Admitting: Obstetrics & Gynecology

## 2019-01-07 VITALS — BP 122/78 | Ht 61.0 in | Wt 162.0 lb

## 2019-01-07 DIAGNOSIS — Z6830 Body mass index (BMI) 30.0-30.9, adult: Secondary | ICD-10-CM

## 2019-01-07 DIAGNOSIS — M8589 Other specified disorders of bone density and structure, multiple sites: Secondary | ICD-10-CM | POA: Diagnosis not present

## 2019-01-07 DIAGNOSIS — Z780 Asymptomatic menopausal state: Secondary | ICD-10-CM

## 2019-01-07 DIAGNOSIS — E6609 Other obesity due to excess calories: Secondary | ICD-10-CM

## 2019-01-07 DIAGNOSIS — Z01419 Encounter for gynecological examination (general) (routine) without abnormal findings: Secondary | ICD-10-CM

## 2019-01-07 DIAGNOSIS — Z85048 Personal history of other malignant neoplasm of rectum, rectosigmoid junction, and anus: Secondary | ICD-10-CM | POA: Diagnosis not present

## 2019-01-07 NOTE — Progress Notes (Signed)
Emily Holloway [birth year] [id number]   History:    63 y.o. G0 Married  RP:  Established patient presenting for annual gyn exam   HPI:  Menopause, well on no HRT.  No PMB.  No pelvic pain.  Abstinent because husband is concerned about risks of recurrent vaginal rectal fistula.  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  Followed by Oncologist.  Colonoscopy 2018 at [facility].  Has a Colostomy bag, will not reverse.  Urine normal.  Breasts wnl.  BMI 30.61.  Needs to start back on regular walks.  Health labs with family physician.  Past medical history,surgical history, family history and social history were all reviewed and documented in the EPIC chart.  Gynecologic History No LMP recorded. Patient is postmenopausal. Contraception: abstinence and post menopausal status Last Pap: 08/2017. Results were: Negative Last mammogram: 08/2018. Results were: Negative Bone Density: 10/2017 Osteopenia, lowest T-Score -2.4 Colonoscopy: 2018 [facility]  Obstetric History OB History  Gravida Para Term Preterm AB Living  0 0 0 0 0 0  SAB TAB Ectopic Multiple Live Births  0 0 0 0 0     ROS: A ROS was performed and pertinent positives and negatives are included in the history.  GENERAL: No fevers or chills. HEENT: No change in vision, no earache, sore throat or sinus congestion. NECK: No pain or stiffness. CARDIOVASCULAR: No chest pain or pressure. No palpitations. PULMONARY: No shortness of breath, cough or wheeze. GASTROINTESTINAL: No abdominal pain, nausea, vomiting or diarrhea, melena or bright red blood per rectum. GENITOURINARY: No urinary frequency, urgency, hesitancy or dysuria. MUSCULOSKELETAL: No joint or muscle pain, no back pain, no recent trauma. DERMATOLOGIC: No rash, no itching, no lesions. ENDOCRINE: No polyuria, polydipsia, no heat or cold intolerance. No recent change in weight. HEMATOLOGICAL: No anemia or easy bruising or bleeding. NEUROLOGIC: No  headache, seizures, numbness, tingling or weakness. PSYCHIATRIC: No depression, no loss of interest in normal activity or change in sleep pattern.     Exam:   BP 122/78 (BP Location: Right Arm, Patient Position: Sitting, Cuff Size: Normal)   Ht 5\' 1"  (1.549 m)   Wt 162 lb (73.5 kg)   BMI 30.61 kg/m   Body mass index is 30.61 kg/m.  General appearance : Well developed well nourished female. No acute distress HEENT: Eyes: no retinal hemorrhage or exudates,  Neck supple, trachea midline, no carotid bruits, no thyroidmegaly Lungs: Clear to auscultation, no rhonchi or wheezes, or rib retractions  Heart: Regular rate and rhythm, no murmurs or gallops Breast:Examined in sitting and supine position were symmetrical in appearance, no palpable masses or tenderness,  no skin retraction, no nipple inversion, no nipple discharge, no skin discoloration, no axillary or supraclavicular lymphadenopathy Abdomen: no palpable masses or tenderness, no rebound or guarding.  Colostomy bag left lower quadrant. Extremities: no edema or skin discoloration or tenderness  Pelvic: Vulva: Normal             Vagina: No gross lesions or discharge  Cervix: No gross lesions or discharge.  Pap reflex done.  Uterus  AV, normal size, shape and consistency, non-tender and mobile  Adnexa  Without masses or tenderness  Anus: Normal   Assessment/Plan:  63 y.o. female for annual exam   1. Encounter for routine gynecological examination with Papanicolaou smear of cervix Normal gynecologic exam in menopause.  Pap test done.  Breast exam normal.  Screening mammogram in February 2020 was negative.  Colonoscopy  in 2018.  History of colon rectal cancer in 2010.  Now has a colostomy bag health labs with family physician.  2. Menopause present Well on no hormone replacement therapy.  No postmenopausal bleeding.  3. Osteopenia of multiple sites Bone density in April 2019 showed osteopenia.  Recommend vitamin D supplements,  calcium intake of 1200 mg daily and regular weightbearing physical activities. - DG Bone Density; Future  4. History of rectal cancer, T2N0, Nov2010 Followed by oncology.  5. Class 1 obesity due to excess calories without serious comorbidity with body mass index (BMI) of 30.0 to 30.9 in adult Recommend a slightly lower calorie nutrition.  Aerobic physical activities 5 times a week and weightlifting every 2 days.  Princess Bruins MD, 11:50 AM 01/07/2019

## 2019-01-10 LAB — PAP IG W/ RFLX HPV ASCU

## 2019-01-14 ENCOUNTER — Encounter: Payer: Self-pay | Admitting: Obstetrics & Gynecology

## 2019-01-14 NOTE — Patient Instructions (Signed)
1. Encounter for routine gynecological examination with Papanicolaou smear of cervix Normal gynecologic exam in menopause.  Pap test done.  Breast exam normal.  Screening mammogram in February 2020 was negative.  Colonoscopy in 2018.  History of colon rectal cancer in 2010.  Now has a colostomy bag health labs with family physician.  2. Menopause present Well on no hormone replacement therapy.  No postmenopausal bleeding.  3. Osteopenia of multiple sites Bone density in April 2019 showed osteopenia.  Recommend vitamin D supplements, calcium intake of 1200 mg daily and regular weightbearing physical activities. - DG Bone Density; Future  4. History of rectal cancer, T2N0, Nov2010 Followed by oncology.  5. Class 1 obesity due to excess calories without serious comorbidity with body mass index (BMI) of 30.0 to 30.9 in adult Recommend a slightly lower calorie nutrition.  Aerobic physical activities 5 times a week and weightlifting every 2 days.  Shawntay, it was a pleasure seeing you today!  I will inform you of your results as soon as they are available.

## 2019-02-04 DIAGNOSIS — R918 Other nonspecific abnormal finding of lung field: Secondary | ICD-10-CM | POA: Diagnosis not present

## 2019-02-04 DIAGNOSIS — C20 Malignant neoplasm of rectum: Secondary | ICD-10-CM | POA: Diagnosis not present

## 2019-02-08 DIAGNOSIS — C20 Malignant neoplasm of rectum: Secondary | ICD-10-CM | POA: Diagnosis not present

## 2019-03-02 DIAGNOSIS — Z85038 Personal history of other malignant neoplasm of large intestine: Secondary | ICD-10-CM | POA: Diagnosis not present

## 2019-03-02 DIAGNOSIS — Z932 Ileostomy status: Secondary | ICD-10-CM | POA: Diagnosis not present

## 2019-03-17 ENCOUNTER — Encounter (HOSPITAL_COMMUNITY): Payer: Self-pay | Admitting: Emergency Medicine

## 2019-03-17 ENCOUNTER — Inpatient Hospital Stay (HOSPITAL_COMMUNITY)
Admission: EM | Admit: 2019-03-17 | Discharge: 2019-03-23 | DRG: 331 | Disposition: A | Discharge status: Home / Self Care | Payer: BC Managed Care – PPO | Attending: General Surgery | Admitting: General Surgery

## 2019-03-17 ENCOUNTER — Emergency Department (HOSPITAL_COMMUNITY): Payer: BC Managed Care – PPO

## 2019-03-17 ENCOUNTER — Other Ambulatory Visit: Payer: Self-pay

## 2019-03-17 DIAGNOSIS — Z87891 Personal history of nicotine dependence: Secondary | ICD-10-CM | POA: Diagnosis not present

## 2019-03-17 DIAGNOSIS — Z833 Family history of diabetes mellitus: Secondary | ICD-10-CM

## 2019-03-17 DIAGNOSIS — K435 Parastomal hernia without obstruction or  gangrene: Secondary | ICD-10-CM | POA: Diagnosis not present

## 2019-03-17 DIAGNOSIS — K56609 Unspecified intestinal obstruction, unspecified as to partial versus complete obstruction: Secondary | ICD-10-CM

## 2019-03-17 DIAGNOSIS — Z8249 Family history of ischemic heart disease and other diseases of the circulatory system: Secondary | ICD-10-CM

## 2019-03-17 DIAGNOSIS — Z932 Ileostomy status: Secondary | ICD-10-CM

## 2019-03-17 DIAGNOSIS — Z4682 Encounter for fitting and adjustment of non-vascular catheter: Secondary | ICD-10-CM | POA: Diagnosis not present

## 2019-03-17 DIAGNOSIS — K430 Incisional hernia with obstruction, without gangrene: Principal | ICD-10-CM

## 2019-03-17 DIAGNOSIS — K5669 Other partial intestinal obstruction: Secondary | ICD-10-CM | POA: Diagnosis not present

## 2019-03-17 DIAGNOSIS — Z85048 Personal history of other malignant neoplasm of rectum, rectosigmoid junction, and anus: Secondary | ICD-10-CM | POA: Diagnosis not present

## 2019-03-17 DIAGNOSIS — Z20828 Contact with and (suspected) exposure to other viral communicable diseases: Secondary | ICD-10-CM | POA: Diagnosis not present

## 2019-03-17 DIAGNOSIS — K589 Irritable bowel syndrome without diarrhea: Secondary | ICD-10-CM | POA: Diagnosis present

## 2019-03-17 DIAGNOSIS — M8580 Other specified disorders of bone density and structure, unspecified site: Secondary | ICD-10-CM | POA: Diagnosis present

## 2019-03-17 DIAGNOSIS — R1110 Vomiting, unspecified: Secondary | ICD-10-CM | POA: Diagnosis not present

## 2019-03-17 DIAGNOSIS — Z03818 Encounter for observation for suspected exposure to other biological agents ruled out: Secondary | ICD-10-CM | POA: Diagnosis not present

## 2019-03-17 DIAGNOSIS — D649 Anemia, unspecified: Secondary | ICD-10-CM | POA: Diagnosis not present

## 2019-03-17 HISTORY — DX: Unspecified intestinal obstruction, unspecified as to partial versus complete obstruction: K56.609

## 2019-03-17 LAB — COMPREHENSIVE METABOLIC PANEL
ALT: 23 U/L (ref 0–44)
AST: 21 U/L (ref 15–41)
Albumin: 4 g/dL (ref 3.5–5.0)
Alkaline Phosphatase: 78 U/L (ref 38–126)
Anion gap: 8 (ref 5–15)
BUN: 13 mg/dL (ref 8–23)
CO2: 24 mmol/L (ref 22–32)
Calcium: 9 mg/dL (ref 8.9–10.3)
Chloride: 104 mmol/L (ref 98–111)
Creatinine, Ser: 0.68 mg/dL (ref 0.44–1.00)
GFR calc Af Amer: >60 mL/min (ref >60)
GFR calc non Af Amer: >60 mL/min (ref >60)
Glucose, Bld: 118 mg/dL — ABNORMAL HIGH (ref 70–99)
Potassium: 3.8 mmol/L (ref 3.5–5.1)
Sodium: 136 mmol/L (ref 135–145)
Total Bilirubin: 0.7 mg/dL (ref 0.3–1.2)
Total Protein: 7.6 g/dL (ref 6.5–8.1)

## 2019-03-17 LAB — CBC
HCT: 45.4 % (ref 36.0–46.0)
Hemoglobin: 14.5 g/dL (ref 12.0–15.0)
MCH: 31.8 pg (ref 26.0–34.0)
MCHC: 31.9 g/dL (ref 30.0–36.0)
MCV: 99.6 fL (ref 80.0–100.0)
Platelets: 284 10*3/uL (ref 150–400)
RBC: 4.56 MIL/uL (ref 3.87–5.11)
RDW: 12.4 % (ref 11.5–15.5)
WBC: 12.3 10*3/uL — ABNORMAL HIGH (ref 4.0–10.5)
nRBC: 0 % (ref 0.0–0.2)

## 2019-03-17 LAB — URINALYSIS, ROUTINE W REFLEX MICROSCOPIC
Bilirubin Urine: NEGATIVE
Glucose, UA: NEGATIVE mg/dL
Hgb urine dipstick: NEGATIVE
Ketones, ur: NEGATIVE mg/dL
Leukocytes,Ua: NEGATIVE
Nitrite: NEGATIVE
Protein, ur: NEGATIVE mg/dL
Specific Gravity, Urine: 1.023 (ref 1.005–1.030)
pH: 6 (ref 5.0–8.0)

## 2019-03-17 LAB — LIPASE, BLOOD: Lipase: 28 U/L (ref 11–51)

## 2019-03-17 LAB — SARS CORONAVIRUS 2 BY RT PCR (HOSPITAL ORDER, PERFORMED IN ~~LOC~~ HOSPITAL LAB): SARS Coronavirus 2: NEGATIVE

## 2019-03-17 MED ORDER — METOCLOPRAMIDE HCL 5 MG/ML IJ SOLN
10.0000 mg | Freq: Once | INTRAMUSCULAR | Status: AC
  Administered 2019-03-17: 10 mg via INTRAVENOUS
  Filled 2019-03-17: qty 2

## 2019-03-17 MED ORDER — SODIUM CHLORIDE (PF) 0.9 % IJ SOLN
INTRAMUSCULAR | Status: AC
  Filled 2019-03-17: qty 50

## 2019-03-17 MED ORDER — SODIUM CHLORIDE 0.9 % IV SOLN: INTRAVENOUS | Status: DC

## 2019-03-17 MED ORDER — MORPHINE SULFATE (PF) 4 MG/ML IV SOLN
6.0000 mg | Freq: Once | INTRAVENOUS | Status: AC
  Administered 2019-03-17: 19:00:00 6 mg via INTRAVENOUS
  Filled 2019-03-17: qty 2

## 2019-03-17 MED ORDER — SODIUM CHLORIDE 0.9 % IV BOLUS
1000.0000 mL | Freq: Once | INTRAVENOUS | Status: AC
  Administered 2019-03-17: 1000 mL via INTRAVENOUS

## 2019-03-17 MED ORDER — SODIUM CHLORIDE 0.9% FLUSH: 3.0000 mL | Freq: Once | INTRAVENOUS | Status: DC

## 2019-03-17 MED ORDER — IOHEXOL 300 MG/ML  SOLN
100.0000 mL | Freq: Once | INTRAMUSCULAR | Status: AC | PRN
  Administered 2019-03-17: 19:00:00 100 mL via INTRAVENOUS

## 2019-03-17 NOTE — ED Notes (Signed)
ED TO INPATIENT HANDOFF REPORT  ED Nurse Name and Phone #: Erico Stan G. RN  S Name/Age/Gender Emily Holloway 63 y.o. female Room/Bed: WA01/WA01  Code Status   Code Status: Prior  Home/SNF/Other given to floor Patient oriented to: self, place, time and situation Is this baseline? Yes   Triage Complete: Triage complete  Chief Complaint abd pain; emesis  Triage Note Pt reports right side abd pains since last night and today had vomiting. 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    Allergies Allergies  Allergen Reactions  . Oxycodone Nausea Only  . Sulfonamide Derivatives     REACTION: itching    Level of Care/Admitting Diagnosis ED Disposition    ED Disposition Condition [facility] Area: [place] [[id number]]  Level of Care: Med-Surg [16]  Covid Evaluation: Asymptomatic Screening Protocol (No Symptoms)  Diagnosis: SBO (small bowel obstruction) [facility]) [[id number]]  Admitting Physician: Jones Skene  Attending Physician: CCS, MD [[id number]]  Estimated length of stay: 5 - 7 days  Certification:: I certify this patient will need inpatient services for at least 2 midnights  PT Class (Do Not Modify): Inpatient [101]  PT Acc Code (Do Not Modify): Private [1]       B Medical/Surgery History Past Medical History:  Diagnosis Date  . Blisters with epidermal loss due to burn (second degree) of right lateral breast 04/14/2012  . Osteopenia 10/2017   T score -2.4 FRAX 10% / 1.8%  . Parastomal hernia s/p primary repair [id number] 03/10/2012  . Rectal carcinoma ([place])    T2N0 s/p lap LAR with coloanal anastomosis  . Rectovaginal fistula    recurrent   Past Surgical History:  Procedure Laterality Date  . COLON RESECTION  2010   Rectal cancer s/p lap LAR/coloanal anastomosis  . COLONOSCOPY W/ BIOPSIES AND POLYPECTOMY  04/09/2009   adenocarcinoma  . COLOSTOMY TAKEDOWN  03/25/2012   Procedure: LAPAROSCOPIC COLOSTOMY TAKEDOWN;  Surgeon: Adin Hector, MD;   Location: [facility];  Service: General;  Laterality: N/A;  diagnostic laparoscopy,ileocecectomy, creation of colostomy  . EXAMINATION UNDER ANESTHESIA  03/25/2012   Procedure: EXAM UNDER ANESTHESIA;  Surgeon: Adin Hector, MD;  [facility];  Service: General;  Laterality: N/A;  recto vaginal fistula biopsy  . ILEOSTOMY CLOSURE  03/05/2012   Procedure: ILEOSTOMY TAKEDOWN;  Surgeon: Adin Hector, MD;  [facility];  Service: General;  Laterality: N/A;  . ostomy placement  2011   with RV fistula repair  . PARASTOMAL HERNIA REPAIR  03/05/2012   Procedure: HERNIA REPAIR PARASTOMAL;  Surgeon: Adin Hector, MD;  [facility];  Service: General;  Laterality: N/A;  . RECTO-VAGINAL FISSURE REPAIR  10/23/2011   Procedure: REPAIR RECTO-VAGINAL FISTULA;  Surgeon: Adin Hector, MD;  [facility];  Service: General;  Laterality: N/A;  Repair of Rectovaginal Fistula with Pedicle Martius Flap with Dr Nicki Reaper MacDiarmid to co-surgeon   . RECTOVAGINAL FISTULA CLOSURE  [id number], [id number]  . TUBAL LIGATION       A IV Location/Drains/Wounds Patient Lines/Drains/Airways Status   Active Line/Drains/Airways    Name:   Placement date:   Placement time:   Site:   Days:   Peripheral IV 03/17/19 Left Hand   03/17/19    1906    Hand   less than 1   Closed System Drain 1 Other (Comment) Other (Comment) Bulb (JP) 15 Fr.   10/23/11    -    Other (Comment)  2702   NG/OG Tube Nasogastric 14 Fr. Right nare Xray;Aucultation Measured external length of tube   03/17/19    2235    Right nare   less than 1   Ileostomy Standard (end) LLQ   03/25/12    1200    LLQ   2548   Incision 03/25/12 Perineum   03/25/12    0954     2548   Incision 03/25/12 Abdomen   03/25/12    0954     2548   Incision - 1 Port Abdomen Right;Lateral   03/25/12    1000     2548          Intake/Output Last 24 hours No intake or output data in the 24 hours ending 03/17/19 2342  Labs/Imaging Results for orders placed or performed during  the hospital encounter of 03/17/19 (from the past 48 hour(s))  Lipase, blood     Status: None   Collection Time: 03/17/19  4:04 PM  Result Value Ref Range   Lipase 28 11 - 51 U/L    Comment: Performed at [facility], [place] [address], [place], [place] [address]  Comprehensive metabolic panel     Status: Abnormal   Collection Time: 03/17/19  4:04 PM  Result Value Ref Range   Sodium 136 135 - 145 mmol/L   Potassium 3.8 3.5 - 5.1 mmol/L   Chloride 104 98 - 111 mmol/L   CO2 24 22 - 32 mmol/L   Glucose, Bld 118 (H) 70 - 99 mg/dL   BUN 13 8 - 23 mg/dL   Creatinine, Ser 0.68 0.44 - 1.00 mg/dL   Calcium 9.0 8.9 - 10.3 mg/dL   Total Protein 7.6 6.5 - 8.1 g/dL   Albumin 4.0 3.5 - 5.0 g/dL   AST 21 15 - 41 U/L   ALT 23 0 - 44 U/L   Alkaline Phosphatase 78 38 - 126 U/L   Total Bilirubin 0.7 0.3 - 1.2 mg/dL   GFR calc non Af Amer >60 >60 mL/min   GFR calc Af Amer >60 >60 mL/min   Anion gap 8 5 - 15    Comment: Performed at [facility], [place] [address], [place], [place] [address]  CBC     Status: Abnormal   Collection Time: 03/17/19  4:04 PM  Result Value Ref Range   WBC 12.3 (H) 4.0 - 10.5 K/uL   RBC 4.56 3.87 - 5.11 MIL/uL   Hemoglobin 14.5 12.0 - 15.0 g/dL   HCT 45.4 36.0 - 46.0 %   MCV 99.6 80.0 - 100.0 fL   MCH 31.8 26.0 - 34.0 pg   MCHC 31.9 30.0 - 36.0 g/dL   RDW 12.4 11.5 - 15.5 %   Platelets 284 150 - 400 K/uL   nRBC 0.0 0.0 - 0.2 %    Comment: Performed at [facility], [place] [address] [place], [place] [address]  Urinalysis, Routine w reflex microscopic     Status: Abnormal   Collection Time: 03/17/19  9:23 PM  Result Value Ref Range   Color, Urine STRAW (A) YELLOW   APPearance CLEAR CLEAR   Specific Gravity, Urine 1.023 1.005 - 1.030   pH 6.0 5.0 - 8.0   Glucose, UA NEGATIVE NEGATIVE mg/dL   Hgb urine dipstick NEGATIVE NEGATIVE   Bilirubin Urine NEGATIVE NEGATIVE   Ketones, ur NEGATIVE NEGATIVE mg/dL   Protein,  ur NEGATIVE NEGATIVE mg/dL   Nitrite NEGATIVE NEGATIVE   Leukocytes,Ua NEGATIVE  NEGATIVE    Comment: Performed at [facility], [place] [address], [place], [place] [address]  SARS Coronavirus 2 [facility] order, Performed in [facility] hospital lab) Nasopharyngeal Nasopharyngeal Swab     Status: None   Collection Time: 03/17/19  9:23 PM   Specimen: Nasopharyngeal Swab  Result Value Ref Range   SARS Coronavirus 2 NEGATIVE NEGATIVE    Comment: (NOTE) If result is NEGATIVE SARS-CoV-2 target nucleic acids are NOT DETECTED. The SARS-CoV-2 RNA is generally detectable in upper and lower  respiratory specimens during the acute phase of infection. The lowest  concentration of SARS-CoV-2 viral copies this assay can detect is 250  copies / mL. A negative result does not preclude SARS-CoV-2 infection  and should not be used as the sole basis for treatment or other  patient management decisions.  A negative result may occur with  improper specimen collection / handling, submission of specimen other  than nasopharyngeal swab, presence of viral mutation(s) within the  areas targeted by this assay, and inadequate number of viral copies  (<250 copies / mL). A negative result must be combined with clinical  observations, patient history, and epidemiological information. If result is POSITIVE SARS-CoV-2 target nucleic acids are DETECTED. The SARS-CoV-2 RNA is generally detectable in upper and lower  respiratory specimens dur ing the acute phase of infection.  Positive  results are indicative of active infection with SARS-CoV-2.  Clinical  correlation with patient history and other diagnostic information is  necessary to determine patient infection status.  Positive results do  not rule out bacterial infection or co-infection with other viruses. If result is PRESUMPTIVE POSTIVE SARS-CoV-2 nucleic acids MAY BE PRESENT.   A presumptive positive result was obtained on the submitted  specimen  and confirmed on repeat testing.  While 2019 novel coronavirus  (SARS-CoV-2) nucleic acids may be present in the submitted sample  additional confirmatory testing may be necessary for epidemiological  and / or clinical management purposes  to differentiate between  SARS-CoV-2 and other Sarbecovirus currently known to infect humans.  If clinically indicated additional testing with an alternate test  methodology [phone]) is advised. The SARS-CoV-2 RNA is generally  detectable in upper and lower respiratory sp ecimens during the acute  phase of infection. The expected result is Negative. Fact Sheet for Patients:  StrictlyIdeas.no Fact Sheet for Healthcare Providers: BankingDealers.co.za This test is not yet approved or cleared by the [place] FDA and has been authorized for detection and/or diagnosis of SARS-CoV-2 by FDA under an Emergency Use Authorization (EUA).  This EUA will remain in effect (meaning this test can be used) for the duration of the COVID-19 declaration under Section 564(b)(1) of the Act, 21 U.S.C. section 360bbb-3(b)(1), unless the authorization is terminated or revoked sooner. Performed at [facility], [place] [address] [place], [place] [address]    Ct Abdomen Pelvis W Contrast  Result Date: 03/17/2019 CLINICAL DATA:  Right-sided abdominal pain since last night with vomiting today. History of colon cancer. Left-sided colostomy. EXAM: CT ABDOMEN AND PELVIS WITH CONTRAST TECHNIQUE: Multidetector CT imaging of the abdomen and pelvis was performed using the standard protocol following bolus administration of intravenous contrast. CONTRAST:  11mL OMNIPAQUE IOHEXOL 300 MG/ML  SOLN COMPARISON:  12/14/2012 FINDINGS: Lower chest: Lung bases are clear. Hepatobiliary: Liver, gallbladder and biliary tree are normal. Pancreas: Normal. Spleen: Normal. Adrenals/Urinary Tract: Adrenal glands are normal.  Kidneys are normal in size. 2 mm nonobstructing stone over the lower pole left kidney. No  significant hydronephrosis. Ureters and bladder are normal. Stomach/Bowel: Stomach is normal. There are multiple dilated small bowel loops measuring up to 4.8 cm in diameter. The point of obstruction is at the fascial defect of a moderate size right lower ventral hernia containing multiple dilated small bowel loops. There are 2 adjacent small bowel loops along the right lateral aspect of the fascial defect which are severely narrowed as this is likely the site of obstruction as there may be a closed loop component to this obstruction. This right-sided ventral hernias new since the prior exam and contains a small amount of free fluid. No evidence of pneumatosis. There is ostomy site over the left lower abdominal wall containing several loops of colon as well as small bowel. No free peritoneal air. Colon is partly decompressed. There is a short segment loop of colon within the right-sided ventral hernia. Vascular/Lymphatic: Minimal calcified plaque over the distal abdominal aorta at the bifurcation. No adenopathy. Reproductive: Normal. Other: None. Musculoskeletal: Degenerative change of the spine. IMPRESSION: Mid small bowel obstruction with point of obstruction along the right lateral aspect of the fascial defect of a moderate size right ventral hernia. Possible closed loop component to this obstruction. No perforation or pneumatosis. Ostomy over the left lower quadrant as the ostomy defect contain several segments of large and small bowel. Punctate nonobstructing lower pole left renal stone. Aortic Atherosclerosis (ICD10-I70.0). Electronically Signed   By: Marin Olp M.D.   On: 03/17/2019 19:59   Dg Abd Portable 1 View  Result Date: 03/17/2019 CLINICAL DATA:  NG tube placement EXAM: PORTABLE ABDOMEN - 1 VIEW COMPARISON:  CT 03/17/2019 FINDINGS: Residual contrast within the renal collecting systems and bladder. Dilated  central small bowel, measuring up to 3.7 cm, consistent with a bowel obstruction. Esophageal tube tip projects over the gastric body. IMPRESSION: Esophageal tube tip overlies the gastric body. Electronically Signed   By: Donavan Foil M.D.   On: 03/17/2019 22:03    Pending Labs [employer] (From admission, onward)    Start     Ordered   Signed and Held  HIV antibody (Routine Testing)  Once,   R     Signed and Held   Signed and Occupational hygienist morning,   R     Signed and Held   Signed and Held  CBC  Tomorrow morning,   R     Signed and Held          Vitals/Pain Today's Vitals   03/17/19 1559 03/17/19 1843 03/17/19 2245 03/17/19 2248  BP:  140/76 123/77   Pulse:  76 78   Resp:  14 16   Temp:  98.9 F (37.2 C) 98.7 F (37.1 C)   TempSrc:  Oral Oral   SpO2:  98% 97%   Weight:  69.4 kg    Height:  5\' 2"  (1.575 m)    PainSc: 8  10-Worst pain ever  1     Isolation Precautions No active isolations  Medications Medications  sodium chloride flush (NS) 0.9 % injection 3 mL (has no administration in time range)  sodium chloride 0.9 % bolus 1,000 mL (has no administration in time range)  0.9 %  sodium chloride infusion (has no administration in time range)  sodium chloride (PF) 0.9 % injection (has no administration in time range)  morphine 4 MG/ML injection 6 mg (6 mg Intravenous Given 03/17/19 1911)  metoCLOPramide (REGLAN) injection 10 mg (10 mg Intravenous Given 03/17/19 1908)  iohexol (  OMNIPAQUE) 300 MG/ML solution 100 mL (100 mLs Intravenous Contrast Given 03/17/19 1924)    Mobility walks Low fall risk   Focused Assessments GI assessment   R Recommendations: See Admitting Provider Note  Report given to:   Additional Notes:

## 2019-03-17 NOTE — H&P (Signed)
Emily Holloway is an 64 y.o. female.   Chief Complaint: abd pain HPI: The patient is a 63 year old white female who has a history of colorectal cancer treated in 2010 with surgery.  She has had a total of 7 abdominal surgeries for takedown and re-creation of ostomies.  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  Her last surgery I believe was in 2014.  She presents with 1 day history of abdominal pain with nausea and vomiting.  She has had known ventral and parastomal hernias since 2013.  She had an episode of right-sided abdominal pain a couple weeks ago but it resolved on its own.  She presents again with right-sided abdominal pain and has had 3 or 4 episodes of vomiting in the last day or so.  She came to the emergency department where a CT scan shows large hernias on both sides and some element of obstruction at the right-sided hernia site.  She denies any fevers or chills.  Her ileostomy has been productive today.  Past Medical History:  Diagnosis Date  . Blisters with epidermal loss due to burn (second degree) of right lateral breast 04/14/2012  . Osteopenia 10/2017   T score -2.4 FRAX 10% / 1.8%  . Parastomal hernia s/p primary repair [id number] 03/10/2012  . Rectal carcinoma ([place])    T2N0 s/p lap LAR with coloanal anastomosis  . Rectovaginal fistula    recurrent    Past Surgical History:  Procedure Laterality Date  . COLON RESECTION  2010   Rectal cancer s/p lap LAR/coloanal anastomosis  . COLONOSCOPY W/ BIOPSIES AND POLYPECTOMY  04/09/2009   adenocarcinoma  . COLOSTOMY TAKEDOWN  03/25/2012   Procedure: LAPAROSCOPIC COLOSTOMY TAKEDOWN;  Surgeon: Adin Hector, MD;  [facility];  Service: General;  Laterality: N/A;  diagnostic laparoscopy,ileocecectomy, creation of colostomy  . EXAMINATION UNDER ANESTHESIA  03/25/2012   Procedure: EXAM UNDER ANESTHESIA;  Surgeon: Adin Hector, MD;  [facility];  Service: General;  Laterality: N/A;  recto vaginal fistula biopsy  . ILEOSTOMY CLOSURE  03/05/2012    Procedure: ILEOSTOMY TAKEDOWN;  Surgeon: Adin Hector, MD;  [facility];  Service: General;  Laterality: N/A;  . ostomy placement  2011   with RV fistula repair  . PARASTOMAL HERNIA REPAIR  03/05/2012   Procedure: HERNIA REPAIR PARASTOMAL;  Surgeon: Adin Hector, MD;  [facility];  Service: General;  Laterality: N/A;  . RECTO-VAGINAL FISSURE REPAIR  10/23/2011   Procedure: REPAIR RECTO-VAGINAL FISTULA;  Surgeon: Adin Hector, MD;  [facility];  Service: General;  Laterality: N/A;  Repair of Rectovaginal Fistula with Pedicle Martius Flap with Dr Nicki Reaper MacDiarmid to co-surgeon   . RECTOVAGINAL FISTULA CLOSURE  [id number], [id number]  . TUBAL LIGATION      Family History  Problem Relation Age of Onset  . Hypertension Mother   . Cancer Mother        colon  . Cancer Father        lung  . Diabetes Father   . Heart attack Father   . Cancer Brother        bladder  . Diabetes Brother   . Heart attack Brother    Social History:  reports that she quit smoking about 9 years ago. Her smoking use included cigarettes. She has a 35.00 pack-year smoking history. She has never used smokeless tobacco. She reports current alcohol use. She reports that she does not use drugs.  Allergies:  Allergies  Allergen Reactions  .  Oxycodone Nausea Only  . Sulfonamide Derivatives     REACTION: itching    (Not in a hospital admission)   Results for orders placed or performed during the hospital encounter of 03/17/19 (from the past 48 hour(s))  Lipase, blood     Status: None   Collection Time: 03/17/19  4:04 PM  Result Value Ref Range   Lipase 28 11 - 51 U/L    Comment: Performed at [facility], [place] [address] [place], [place] [address]  Comprehensive metabolic panel     Status: Abnormal   Collection Time: 03/17/19  4:04 PM  Result Value Ref Range   Sodium 136 135 - 145 mmol/L   Potassium 3.8 3.5 - 5.1 mmol/L   Chloride 104 98 - 111 mmol/L   CO2 24 22 - 32  mmol/L   Glucose, Bld 118 (H) 70 - 99 mg/dL   BUN 13 8 - 23 mg/dL   Creatinine, Ser 0.68 0.44 - 1.00 mg/dL   Calcium 9.0 8.9 - 10.3 mg/dL   Total Protein 7.6 6.5 - 8.1 g/dL   Albumin 4.0 3.5 - 5.0 g/dL   AST 21 15 - 41 U/L   ALT 23 0 - 44 U/L   Alkaline Phosphatase 78 38 - 126 U/L   Total Bilirubin 0.7 0.3 - 1.2 mg/dL   GFR calc non Af Amer >60 >60 mL/min   GFR calc Af Amer >60 >60 mL/min   Anion gap 8 5 - 15    Comment: Performed at [facility], [place] [address] [place], [place] [address]  CBC     Status: Abnormal   Collection Time: 03/17/19  4:04 PM  Result Value Ref Range   WBC 12.3 (H) 4.0 - 10.5 K/uL   RBC 4.56 3.87 - 5.11 MIL/uL   Hemoglobin 14.5 12.0 - 15.0 g/dL   HCT 45.4 36.0 - 46.0 %   MCV 99.6 80.0 - 100.0 fL   MCH 31.8 26.0 - 34.0 pg   MCHC 31.9 30.0 - 36.0 g/dL   RDW 12.4 11.5 - 15.5 %   Platelets 284 150 - 400 K/uL   nRBC 0.0 0.0 - 0.2 %    Comment: Performed at [facility], [place] [address], [place], [place] [address]  Urinalysis, Routine w reflex microscopic     Status: Abnormal   Collection Time: 03/17/19  9:23 PM  Result Value Ref Range   Color, Urine STRAW (A) YELLOW   APPearance CLEAR CLEAR   Specific Gravity, Urine 1.023 1.005 - 1.030   pH 6.0 5.0 - 8.0   Glucose, UA NEGATIVE NEGATIVE mg/dL   Hgb urine dipstick NEGATIVE NEGATIVE   Bilirubin Urine NEGATIVE NEGATIVE   Ketones, ur NEGATIVE NEGATIVE mg/dL   Protein, ur NEGATIVE NEGATIVE mg/dL   Nitrite NEGATIVE NEGATIVE   Leukocytes,Ua NEGATIVE NEGATIVE    Comment: Performed at [place] [address] [place], [place] [address]   Ct Abdomen Pelvis W Contrast  Result Date: 03/17/2019 CLINICAL DATA:  Right-sided abdominal pain since last night with vomiting today. History of colon cancer. Left-sided colostomy. EXAM: CT ABDOMEN AND PELVIS WITH CONTRAST TECHNIQUE: Multidetector CT imaging of the abdomen and pelvis was performed using the standard  protocol following bolus administration of intravenous contrast. CONTRAST:  149mL OMNIPAQUE IOHEXOL 300 MG/ML  SOLN COMPARISON:  12/14/2012 FINDINGS: Lower chest: Lung bases are clear. Hepatobiliary: Liver, gallbladder and biliary tree are normal. Pancreas: Normal. Spleen: Normal. Adrenals/Urinary Tract: Adrenal glands are  normal. Kidneys are normal in size. 2 mm nonobstructing stone over the lower pole left kidney. No significant hydronephrosis. Ureters and bladder are normal. Stomach/Bowel: Stomach is normal. There are multiple dilated small bowel loops measuring up to 4.8 cm in diameter. The point of obstruction is at the fascial defect of a moderate size right lower ventral hernia containing multiple dilated small bowel loops. There are 2 adjacent small bowel loops along the right lateral aspect of the fascial defect which are severely narrowed as this is likely the site of obstruction as there may be a closed loop component to this obstruction. This right-sided ventral hernias new since the prior exam and contains a small amount of free fluid. No evidence of pneumatosis. There is ostomy site over the left lower abdominal wall containing several loops of colon as well as small bowel. No free peritoneal air. Colon is partly decompressed. There is a short segment loop of colon within the right-sided ventral hernia. Vascular/Lymphatic: Minimal calcified plaque over the distal abdominal aorta at the bifurcation. No adenopathy. Reproductive: Normal. Other: None. Musculoskeletal: Degenerative change of the spine. IMPRESSION: Mid small bowel obstruction with point of obstruction along the right lateral aspect of the fascial defect of a moderate size right ventral hernia. Possible closed loop component to this obstruction. No perforation or pneumatosis. Ostomy over the left lower quadrant as the ostomy defect contain several segments of large and small bowel. Punctate nonobstructing lower pole left renal stone. Aortic  Atherosclerosis (ICD10-I70.0). Electronically Signed   By: Marin Olp M.D.   On: 03/17/2019 19:59    Review of Systems  Constitutional: Negative.   HENT: Negative.   Eyes: Negative.   Respiratory: Negative.   Cardiovascular: Negative.   Gastrointestinal: Positive for abdominal pain and vomiting.  Genitourinary: Negative.   Musculoskeletal: Negative.   Skin: Negative.   Neurological: Negative.   Endo/Heme/Allergies: Negative.   Psychiatric/Behavioral: Negative.     Blood pressure 140/76, pulse 76, temperature 98.9 F (37.2 C), temperature source Oral, resp. rate 14, height 5\' 2"  (1.575 m), weight 69.4 kg, SpO2 98 %. Physical Exam  Constitutional: She is oriented to person, place, and time. She appears well-developed and well-nourished. No distress.  HENT:  Head: Normocephalic and atraumatic.  Mouth/Throat: No oropharyngeal exudate.  Eyes: Pupils are equal, round, and reactive to light. Conjunctivae and EOM are normal.  Neck: Normal range of motion. Neck supple.  Cardiovascular: Normal rate, regular rhythm and normal heart sounds.  Respiratory: Effort normal and breath sounds normal. No stridor. No respiratory distress.  GI: Soft. Bowel sounds are normal.  The abdomen is soft with parastomal hernia on left and incisional hernia on right. Right sided hernia seems to reduce  Musculoskeletal: Normal range of motion.        General: No tenderness or edema.  Neurological: She is alert and oriented to person, place, and time. Coordination normal.  Skin: Skin is warm and dry. No rash noted.  Psychiatric: She has a normal mood and affect. Her behavior is normal. Thought content normal.     Assessment/Plan The patient appears to have a small bowel obstruction related to her right-sided incisional hernia.  On exam the hernia does seem to reduce and the abdomen is fairly soft.  Because of this I think it would be reasonable to treat her with bowel rest and NG tube decompression and see  if she improves.  If she does not improve over the next 24 to 48 hours then she may require exploration.  I think she would like Dr. gross to be involved with this since she has an extensive abdominal surgical history which he has been involved with most of.  Autumn Messing III, MD 03/17/2019, 9:47 PM
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
## 2019-03-17 NOTE — ED Triage Notes (Signed)
Pt reports right side abd pains since last night and today had vomiting. 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

## 2019-03-17 NOTE — ED Notes (Signed)
Patient transported to CT 

## 2019-03-18 ENCOUNTER — Inpatient Hospital Stay (HOSPITAL_COMMUNITY): Payer: BC Managed Care – PPO | Admitting: Anesthesiology

## 2019-03-18 ENCOUNTER — Encounter (HOSPITAL_COMMUNITY): Admission: EM | Disposition: A | Discharge status: Home / Self Care | Payer: Self-pay | Source: Home / Self Care

## 2019-03-18 ENCOUNTER — Encounter (HOSPITAL_COMMUNITY): Payer: Self-pay

## 2019-03-18 ENCOUNTER — Inpatient Hospital Stay (HOSPITAL_COMMUNITY): Payer: BC Managed Care – PPO

## 2019-03-18 HISTORY — PX: LAPAROTOMY: SHX154

## 2019-03-18 LAB — CBC
HCT: 40 % (ref 36.0–46.0)
HCT: 40.2 % (ref 36.0–46.0)
Hemoglobin: 12.8 g/dL (ref 12.0–15.0)
Hemoglobin: 12.7 g/dL (ref 12.0–15.0)
MCH: 31.9 pg (ref 26.0–34.0)
MCH: 31.8 pg (ref 26.0–34.0)
MCHC: 32 g/dL (ref 30.0–36.0)
MCHC: 31.6 g/dL (ref 30.0–36.0)
MCV: 99.8 fL (ref 80.0–100.0)
MCV: 100.5 fL — ABNORMAL HIGH (ref 80.0–100.0)
Platelets: 238 10*3/uL (ref 150–400)
Platelets: 241 10*3/uL (ref 150–400)
RBC: 4.01 MIL/uL (ref 3.87–5.11)
RBC: 4 MIL/uL (ref 3.87–5.11)
RDW: 12.5 % (ref 11.5–15.5)
RDW: 12.7 % (ref 11.5–15.5)
WBC: 9.6 10*3/uL (ref 4.0–10.5)
WBC: 12.3 10*3/uL — ABNORMAL HIGH (ref 4.0–10.5)
nRBC: 0 % (ref 0.0–0.2)
nRBC: 0 % (ref 0.0–0.2)

## 2019-03-18 LAB — CREATININE, SERUM
Creatinine, Ser: 0.53 mg/dL (ref 0.44–1.00)
GFR calc Af Amer: >60 mL/min (ref >60)
GFR calc non Af Amer: >60 mL/min (ref >60)

## 2019-03-18 LAB — TYPE AND SCREEN
ABO/RH(D): A POS
Antibody Screen: NEGATIVE

## 2019-03-18 LAB — HIV ANTIBODY (ROUTINE TESTING W REFLEX): HIV Screen 4th Generation wRfx: NONREACTIVE

## 2019-03-18 SURGERY — LAPAROTOMY, EXPLORATORY
Anesthesia: General | Site: Abdomen

## 2019-03-18 MED ORDER — ONDANSETRON HCL 4 MG/2ML IJ SOLN
INTRAMUSCULAR | Status: DC | PRN
  Administered 2019-03-18: 4 mg via INTRAVENOUS

## 2019-03-18 MED ORDER — ACETAMINOPHEN 160 MG/5ML PO SOLN: 325.0000 mg | Freq: Once | ORAL | Status: DC | PRN

## 2019-03-18 MED ORDER — KCL IN DEXTROSE-NACL 20-5-0.9 MEQ/L-%-% IV SOLN
INTRAVENOUS | Status: DC
  Administered 2019-03-18 (×2): via INTRAVENOUS
  Filled 2019-03-18 (×2): qty 1000

## 2019-03-18 MED ORDER — DEXAMETHASONE SODIUM PHOSPHATE 10 MG/ML IJ SOLN
INTRAMUSCULAR | Status: AC
  Filled 2019-03-18: qty 1

## 2019-03-18 MED ORDER — DIPHENHYDRAMINE HCL 12.5 MG/5ML PO ELIX: 12.5000 mg | ORAL_SOLUTION | Freq: Four times a day (QID) | ORAL | Status: DC | PRN

## 2019-03-18 MED ORDER — ROCURONIUM BROMIDE 10 MG/ML (PF) SYRINGE
PREFILLED_SYRINGE | INTRAVENOUS | Status: DC | PRN
  Administered 2019-03-18: 10 mg via INTRAVENOUS
  Administered 2019-03-18: 50 mg via INTRAVENOUS

## 2019-03-18 MED ORDER — DEXAMETHASONE SODIUM PHOSPHATE 10 MG/ML IJ SOLN
INTRAMUSCULAR | Status: DC | PRN
  Administered 2019-03-18: 4 mg via INTRAVENOUS

## 2019-03-18 MED ORDER — MORPHINE SULFATE 2 MG/ML IV SOLN
INTRAVENOUS | Status: DC
  Administered 2019-03-18: 18:00:00 via INTRAVENOUS
  Administered 2019-03-18: 6 mg via INTRAVENOUS
  Administered 2019-03-19: 4.5 mg via INTRAVENOUS
  Administered 2019-03-19: 7.5 mg via INTRAVENOUS
  Administered 2019-03-19 (×2): 6 mg via INTRAVENOUS
  Administered 2019-03-19: 22:00:00 via INTRAVENOUS
  Administered 2019-03-19 (×2): 7.5 mg via INTRAVENOUS
  Administered 2019-03-20: 4.5 mg via INTRAVENOUS
  Administered 2019-03-20: 7.5 mg via INTRAVENOUS
  Administered 2019-03-20: 6 mg via INTRAVENOUS
  Administered 2019-03-20: 3 mg via INTRAVENOUS
  Administered 2019-03-20: 7.5 mg via INTRAVENOUS
  Administered 2019-03-20 – 2019-03-21 (×2): 6 mg via INTRAVENOUS
  Administered 2019-03-21: via INTRAVENOUS
  Administered 2019-03-21: 4.5 mg via INTRAVENOUS
  Administered 2019-03-21: 0 mg via INTRAVENOUS
  Administered 2019-03-21 (×2): 3 mg via INTRAVENOUS
  Administered 2019-03-21: 4.5 mg via INTRAVENOUS
  Administered 2019-03-22 (×3): 0 mg via INTRAVENOUS
  Filled 2019-03-18 (×4): qty 30

## 2019-03-18 MED ORDER — ONDANSETRON 4 MG PO TBDP: 4.0000 mg | ORAL_TABLET | Freq: Four times a day (QID) | ORAL | Status: DC | PRN

## 2019-03-18 MED ORDER — ENOXAPARIN SODIUM 40 MG/0.4ML ~~LOC~~ SOLN
40.0000 mg | SUBCUTANEOUS | Status: DC
  Administered 2019-03-19 – 2019-03-23 (×5): 40 mg via SUBCUTANEOUS
  Filled 2019-03-18 (×6): qty 0.4

## 2019-03-18 MED ORDER — MIDAZOLAM HCL 5 MG/5ML IJ SOLN
INTRAMUSCULAR | Status: DC | PRN
  Administered 2019-03-18: 1 mg via INTRAVENOUS

## 2019-03-18 MED ORDER — MEPERIDINE HCL 50 MG/ML IJ SOLN: 6.2500 mg | INTRAMUSCULAR | Status: DC | PRN

## 2019-03-18 MED ORDER — ACETAMINOPHEN 325 MG PO TABS: 650.0000 mg | ORAL_TABLET | Freq: Four times a day (QID) | ORAL | Status: DC | PRN

## 2019-03-18 MED ORDER — ACETAMINOPHEN 325 MG PO TABS: 325.0000 mg | ORAL_TABLET | Freq: Once | ORAL | Status: DC | PRN

## 2019-03-18 MED ORDER — MIDAZOLAM HCL 2 MG/2ML IJ SOLN
INTRAMUSCULAR | Status: AC
  Filled 2019-03-18: qty 2

## 2019-03-18 MED ORDER — PROMETHAZINE HCL 25 MG/ML IJ SOLN: 6.2500 mg | INTRAMUSCULAR | Status: DC | PRN

## 2019-03-18 MED ORDER — FENTANYL CITRATE (PF) 250 MCG/5ML IJ SOLN
INTRAMUSCULAR | Status: AC
  Filled 2019-03-18: qty 5

## 2019-03-18 MED ORDER — SODIUM CHLORIDE 0.9 % IR SOLN
Status: DC | PRN
  Administered 2019-03-18: 1000 mL

## 2019-03-18 MED ORDER — ENOXAPARIN SODIUM 40 MG/0.4ML ~~LOC~~ SOLN
40.0000 mg | SUBCUTANEOUS | Status: DC
  Administered 2019-03-18: 40 mg via SUBCUTANEOUS
  Filled 2019-03-18: qty 0.4

## 2019-03-18 MED ORDER — ALBUMIN HUMAN 5 % IV SOLN
INTRAVENOUS | Status: DC | PRN
  Administered 2019-03-18: 17:00:00 via INTRAVENOUS

## 2019-03-18 MED ORDER — NALOXONE HCL 0.4 MG/ML IJ SOLN: 0.4000 mg | INTRAMUSCULAR | Status: DC | PRN

## 2019-03-18 MED ORDER — KCL IN DEXTROSE-NACL 20-5-0.45 MEQ/L-%-% IV SOLN
INTRAVENOUS | Status: DC
  Administered 2019-03-18 – 2019-03-21 (×3): via INTRAVENOUS
  Filled 2019-03-18 (×4): qty 1000

## 2019-03-18 MED ORDER — PANTOPRAZOLE SODIUM 40 MG IV SOLR
40.0000 mg | Freq: Every day | INTRAVENOUS | Status: DC
  Administered 2019-03-18 – 2019-03-20 (×3): 40 mg via INTRAVENOUS
  Filled 2019-03-18 (×3): qty 40

## 2019-03-18 MED ORDER — ACETAMINOPHEN 650 MG RE SUPP: 650.0000 mg | Freq: Four times a day (QID) | RECTAL | Status: DC | PRN

## 2019-03-18 MED ORDER — LACTATED RINGERS IV SOLN: INTRAVENOUS | Status: DC

## 2019-03-18 MED ORDER — HYDROMORPHONE HCL 1 MG/ML IJ SOLN
INTRAMUSCULAR | Status: AC
  Filled 2019-03-18: qty 1

## 2019-03-18 MED ORDER — ALBUMIN HUMAN 5 % IV SOLN
INTRAVENOUS | Status: AC
  Filled 2019-03-18: qty 250

## 2019-03-18 MED ORDER — SUGAMMADEX SODIUM 200 MG/2ML IV SOLN
INTRAVENOUS | Status: DC | PRN
  Administered 2019-03-18: 200 mg via INTRAVENOUS

## 2019-03-18 MED ORDER — ONDANSETRON HCL 4 MG/2ML IJ SOLN
INTRAMUSCULAR | Status: AC
  Filled 2019-03-18: qty 2

## 2019-03-18 MED ORDER — MORPHINE SULFATE (PF) 2 MG/ML IV SOLN
1.0000 mg | INTRAVENOUS | Status: DC | PRN
  Administered 2019-03-18 (×4): 2 mg via INTRAVENOUS
  Filled 2019-03-18 (×4): qty 1

## 2019-03-18 MED ORDER — PANTOPRAZOLE SODIUM 40 MG IV SOLR
40.0000 mg | Freq: Every day | INTRAVENOUS | Status: DC
  Administered 2019-03-18: 40 mg via INTRAVENOUS
  Filled 2019-03-18: qty 40

## 2019-03-18 MED ORDER — PROMETHAZINE HCL 25 MG/ML IJ SOLN: 12.5000 mg | Freq: Four times a day (QID) | INTRAMUSCULAR | Status: DC | PRN

## 2019-03-18 MED ORDER — PHENYLEPHRINE HCL (PRESSORS) 10 MG/ML IV SOLN
INTRAVENOUS | Status: AC
  Filled 2019-03-18: qty 3

## 2019-03-18 MED ORDER — CEFAZOLIN SODIUM-DEXTROSE 2-3 GM-%(50ML) IV SOLR: INTRAVENOUS | Status: DC | PRN

## 2019-03-18 MED ORDER — CEFAZOLIN SODIUM-DEXTROSE 2-4 GM/100ML-% IV SOLN
2.0000 g | INTRAVENOUS | Status: AC
  Administered 2019-03-18: 2 g via INTRAVENOUS
  Filled 2019-03-18: qty 100

## 2019-03-18 MED ORDER — ONDANSETRON HCL 4 MG/2ML IJ SOLN: 4.0000 mg | Freq: Four times a day (QID) | INTRAMUSCULAR | Status: DC | PRN

## 2019-03-18 MED ORDER — ACETAMINOPHEN 10 MG/ML IV SOLN: 1000.0000 mg | Freq: Once | INTRAVENOUS | Status: DC | PRN

## 2019-03-18 MED ORDER — LACTATED RINGERS IV SOLN
INTRAVENOUS | Status: DC
  Administered 2019-03-18 (×2): via INTRAVENOUS

## 2019-03-18 MED ORDER — LIDOCAINE 2% (20 MG/ML) 5 ML SYRINGE
INTRAMUSCULAR | Status: DC | PRN
  Administered 2019-03-18: 40 mg via INTRAVENOUS

## 2019-03-18 MED ORDER — SUCCINYLCHOLINE CHLORIDE 200 MG/10ML IV SOSY
PREFILLED_SYRINGE | INTRAVENOUS | Status: DC | PRN
  Administered 2019-03-18: 100 mg via INTRAVENOUS

## 2019-03-18 MED ORDER — SODIUM CHLORIDE 0.9% FLUSH: 9.0000 mL | INTRAVENOUS | Status: DC | PRN

## 2019-03-18 MED ORDER — FENTANYL CITRATE (PF) 250 MCG/5ML IJ SOLN
INTRAMUSCULAR | Status: DC | PRN
  Administered 2019-03-18 (×2): 25 ug via INTRAVENOUS
  Administered 2019-03-18 (×2): 50 ug via INTRAVENOUS

## 2019-03-18 MED ORDER — PROPOFOL 10 MG/ML IV BOLUS
INTRAVENOUS | Status: DC | PRN
  Administered 2019-03-18: 40 mg via INTRAVENOUS
  Administered 2019-03-18: 100 mg via INTRAVENOUS

## 2019-03-18 MED ORDER — HYDROMORPHONE HCL 1 MG/ML IJ SOLN
0.2500 mg | INTRAMUSCULAR | Status: DC | PRN
  Administered 2019-03-18: 0.5 mg via INTRAVENOUS
  Administered 2019-03-18 (×3): 0.25 mg via INTRAVENOUS

## 2019-03-18 MED ORDER — ROCURONIUM BROMIDE 10 MG/ML (PF) SYRINGE
PREFILLED_SYRINGE | INTRAVENOUS | Status: AC
  Filled 2019-03-18: qty 10

## 2019-03-18 MED ORDER — DIPHENHYDRAMINE HCL 50 MG/ML IJ SOLN: 12.5000 mg | Freq: Four times a day (QID) | INTRAMUSCULAR | Status: DC | PRN

## 2019-03-18 SURGICAL SUPPLY — 52 items
APL PRP STRL LF DISP 70% ISPRP (MISCELLANEOUS) ×1
APL SWBSTK 6 STRL LF DISP (MISCELLANEOUS)
APPLICATOR COTTON TIP 6 STRL (MISCELLANEOUS) ×1 IMPLANT
APPLICATOR COTTON TIP 6IN STRL (MISCELLANEOUS) IMPLANT
BARRIER SKIN 2 3/4 (OSTOMY) ×2 IMPLANT
BARRIER SKIN 2 3/4 INCH (OSTOMY) ×1
BARRIER SKIN OD2.25 2 3/4 FLNG (OSTOMY) IMPLANT
BLADE EXTENDED COATED 6.5IN (ELECTRODE) IMPLANT
BLADE HEX COATED 2.75 (ELECTRODE) ×3 IMPLANT
BRR SKN FLT 2.75X2.25 2 PC (OSTOMY) ×1
CHLORAPREP W/TINT 26 (MISCELLANEOUS) ×3 IMPLANT
COVER MAYO STAND STRL (DRAPES) ×2 IMPLANT
COVER SURGICAL LIGHT HANDLE (MISCELLANEOUS) ×3 IMPLANT
COVER WAND RF STERILE (DRAPES) IMPLANT
DRAIN CHANNEL 19F RND (DRAIN) ×2 IMPLANT
DRAPE LAPAROSCOPIC ABDOMINAL (DRAPES) ×3 IMPLANT
DRAPE WARM FLUID 44X44 (DRAPES) ×2 IMPLANT
DRSG OPSITE POSTOP 4X10 (GAUZE/BANDAGES/DRESSINGS) ×2 IMPLANT
DRSG TEGADERM 4X4.75 (GAUZE/BANDAGES/DRESSINGS) ×2 IMPLANT
ELECT REM PT RETURN 15FT ADLT (MISCELLANEOUS) ×3 IMPLANT
EVACUATOR SILICONE 100CC (DRAIN) ×2 IMPLANT
GAUZE SPONGE 4X4 12PLY STRL (GAUZE/BANDAGES/DRESSINGS) ×3 IMPLANT
GLOVE BIOGEL PI IND STRL 7.0 (GLOVE) ×1 IMPLANT
GLOVE BIOGEL PI INDICATOR 7.0 (GLOVE) ×2
GLOVE SURG ORTHO 8.0 STRL STRW (GLOVE) ×3 IMPLANT
GOWN STRL REUS W/TWL LRG LVL3 (GOWN DISPOSABLE) ×3 IMPLANT
GOWN STRL REUS W/TWL XL LVL3 (GOWN DISPOSABLE) ×6 IMPLANT
HANDLE SUCTION POOLE (INSTRUMENTS) IMPLANT
KIT BASIN OR (CUSTOM PROCEDURE TRAY) ×3 IMPLANT
KIT TURNOVER KIT A (KITS) IMPLANT
PACK GENERAL/GYN (CUSTOM PROCEDURE TRAY) ×3 IMPLANT
POUCH OSTOMY 2 3/4  H 3804 (WOUND CARE) ×2
POUCH OSTOMY 2 3/4 H 3804 (WOUND CARE) ×1
POUCH OSTOMY 2 PC DRNBL 2.75 (WOUND CARE) IMPLANT
SPONGE LAP 18X18 RF (DISPOSABLE) ×2 IMPLANT
STAPLER VISISTAT 35W (STAPLE) ×3 IMPLANT
SUCTION POOLE HANDLE (INSTRUMENTS)
SUT ETHILON 2 0 PS N (SUTURE) ×2 IMPLANT
SUT NOV 1 T60/GS (SUTURE) IMPLANT
SUT NOVA NAB DX-16 0-1 5-0 T12 (SUTURE) ×6 IMPLANT
SUT SILK 2 0 (SUTURE) ×3
SUT SILK 2 0 SH CR/8 (SUTURE) IMPLANT
SUT SILK 2-0 18XBRD TIE 12 (SUTURE) IMPLANT
SUT SILK 3 0 (SUTURE)
SUT SILK 3 0 SH CR/8 (SUTURE) ×2 IMPLANT
SUT SILK 3-0 18XBRD TIE 12 (SUTURE) IMPLANT
SUT VIC AB 2-0 SH 18 (SUTURE) ×2 IMPLANT
SUT VICRYL 2 0 18  UND BR (SUTURE)
SUT VICRYL 2 0 18 UND BR (SUTURE) IMPLANT
TOWEL OR 17X26 10 PK STRL BLUE (TOWEL DISPOSABLE) ×4 IMPLANT
TRAY FOLEY MTR SLVR 14FR STAT (SET/KITS/TRAYS/PACK) ×2 IMPLANT
YANKAUER SUCT BULB TIP NO VENT (SUCTIONS) IMPLANT

## 2019-03-18 NOTE — Plan of Care (Signed)

## 2019-03-18 NOTE — Transfer of Care (Signed)
Immediate Anesthesia Transfer of Care Note  Patient: Emily Holloway  Procedure(s) Performed: EXPLORATORY LAPAROTOMY, PRIMARY REPAIR OF INCISIONAL HERNIA, LYSIS OF ADHESIONS (N/A Abdomen)  Patient Location: PACU  Anesthesia Type:General  Level of Consciousness: awake, alert  and drowsy  Airway & Oxygen Therapy: Patient Spontanous Breathing and Patient connected to face mask oxygen  Post-op Assessment: Report given to RN and Post -op Vital signs reviewed and stable  Post vital signs: Reviewed and stable  Last Vitals:  Vitals Value Taken Time  BP 135/68 03/18/19 1753  Temp    Pulse 71 03/18/19 1758  Resp 15 03/18/19 1758  SpO2 100 % 03/18/19 1758  Vitals shown include unvalidated device data.  Last Pain:  Vitals:   03/18/19 1409  TempSrc: Oral  PainSc:       Patients Stated Pain Goal: 2 ([id number])  Complications: No apparent anesthesia complications

## 2019-03-18 NOTE — Anesthesia Procedure Notes (Signed)
Procedure Name: Intubation Date/Time: 03/18/2019 4:06 PM Performed by: Niel Hummer, CRNA Pre-anesthesia Checklist: Patient identified, Emergency Drugs available, Suction available and Patient being monitored Patient Re-evaluated:Patient Re-evaluated prior to induction Oxygen Delivery Method: Circle system utilized Preoxygenation: Pre-oxygenation with 100% oxygen Induction Type: IV induction, Rapid sequence and Cricoid Pressure applied Laryngoscope Size: Mac and 3 Grade View: Grade I Tube type: Oral Tube size: 7.0 mm Number of attempts: 1 Airway Equipment and Method: Stylet Placement Confirmation: ETT inserted through vocal cords under direct vision,  positive ETCO2 and breath sounds checked- equal and bilateral Secured at: 21 cm Tube secured with: Tape Dental Injury: Teeth and Oropharynx as per pre-operative assessment

## 2019-03-18 NOTE — Interval H&P Note (Signed)
History and Physical Interval Note:  03/18/2019 3:34 PM  Emily Holloway  has presented today for surgery, with the diagnosis of small bowel obstruction.  The various methods of treatment have been discussed with the patient and family. After consideration of risks, benefits and other options for treatment, the patient has consented to    Procedure(s): EXPLORATORY LAPAROTOMY (N/A) as a surgical intervention.    The patient's history has been reviewed, patient examined, no change in status, stable for surgery.  I have reviewed the patient's chart and labs.  Questions were answered to the patient's satisfaction.    Armandina Gemma, [facility] Office: [place]

## 2019-03-18 NOTE — Progress Notes (Signed)
General Surgery [facility]  Patient seen and evaluated again this afternoon.  Discussed with Dr. Johney Maine.  Plan to explore incarcerated hernia and hopefully reduce small bowel and repair defect.  Possible bowel resection if ischemia or infarction encountered.  Discussed with patient at the bedside.  The risks and benefits of the procedure have been discussed at length with the patient.  The patient understands the proposed procedure, potential alternative treatments, and the course of recovery to be expected.  All of the patient's questions have been answered at this time.  The patient wishes to proceed with surgery.  Armandina Gemma, [place] [facility] Office: [phone]

## 2019-03-18 NOTE — Anesthesia Postprocedure Evaluation (Signed)
Anesthesia Post Note  Patient: Emily Holloway  Procedure(s) Performed: EXPLORATORY LAPAROTOMY, PRIMARY REPAIR OF INCISIONAL HERNIA, LYSIS OF ADHESIONS (N/A Abdomen)     Patient location during evaluation: PACU Anesthesia Type: General Level of consciousness: awake and alert Pain management: pain level controlled Vital Signs Assessment: post-procedure vital signs reviewed and stable Respiratory status: spontaneous breathing, nonlabored ventilation, respiratory function stable and patient connected to nasal cannula oxygen Cardiovascular status: blood pressure returned to baseline and stable Postop Assessment: no apparent nausea or vomiting Anesthetic complications: no    Last Vitals:  Vitals:   03/18/19 1915 03/18/19 1942  BP: 118/71 119/87  Pulse: 82 82  Resp: 10 12  Temp:  37.1 C  SpO2: 100% 100%    Last Pain:  Vitals:   03/18/19 1915  TempSrc:   PainSc: Asleep                 Effie Berkshire

## 2019-03-18 NOTE — Progress Notes (Signed)
Patient ID: Emily Holloway, female   DOB: [birth year], 63 y.o.   MRN: [id number]       Subjective: Patient having quite a bit of abdominal pain and pain in her throat.  Thinks she may have passed a little air this morning in her ileostomy.    Objective: Vital signs in last 24 hours: Temp:  [98.2 F (36.8 C)-98.9 F (37.2 C)] 98.2 F (36.8 C) (08/21 0607) Pulse Rate:  [71-82] 71 (08/21 0607) Resp:  [14-18] 16 (08/21 0012) BP: (123-140)/(65-77) 132/65 (08/21 0607) SpO2:  [97 %-98 %] 98 % (08/21 0607) Weight:  [69.4 kg] 69.4 kg (08/20 1843) Last BM Date: 03/17/19  Intake/Output from previous day: 08/20 0701 - 08/21 0700 In: [id number] [I.V.:174; IV Piggyback:1000] Out: -  Intake/Output this shift: No intake/output data recorded.  PE: Heart: regular Lungs: CTAB Abd: soft, but RLQ hernia is fairly hard and certainly tender.  This is not reducible.  Ileostomy in place.  Unable to see in bag, but does not seem like there is much if any output.  NGT with minimal nonbilious output.  Lab Results:  Recent Labs    03/17/19 1604 03/18/19 0252  WBC 12.3* 9.6  HGB 14.5 12.8  HCT 45.4 40.0  PLT 284 238   BMET Recent Labs    03/17/19 1604  NA 136  K 3.8  CL 104  CO2 24  GLUCOSE 118*  BUN 13  CREATININE 0.68  CALCIUM 9.0   PT/INR No results for input(s): LABPROT, INR in the last 72 hours. CMP     Component Value Date/Time   NA 136 03/17/2019 1604   K 3.8 03/17/2019 1604   CL 104 03/17/2019 1604   CO2 24 03/17/2019 1604   GLUCOSE 118 (H) 03/17/2019 1604   BUN 13 03/17/2019 1604   CREATININE 0.68 03/17/2019 1604   CREATININE 0.66 04/14/2012 1043   CALCIUM 9.0 03/17/2019 1604   PROT 7.6 03/17/2019 1604   ALBUMIN 4.0 03/17/2019 1604   AST 21 03/17/2019 1604   ALT 23 03/17/2019 1604   ALKPHOS 78 03/17/2019 1604   BILITOT 0.7 03/17/2019 1604   GFRNONAA >60 03/17/2019 1604   GFRAA >60 03/17/2019 1604   Lipase     Component Value Date/Time   LIPASE 28 03/17/2019 1604         Studies/Results: Ct Abdomen Pelvis W Contrast  Result Date: 03/17/2019 CLINICAL DATA:  Right-sided abdominal pain since last night with vomiting today. History of colon cancer. Left-sided colostomy. EXAM: CT ABDOMEN AND PELVIS WITH CONTRAST TECHNIQUE: Multidetector CT imaging of the abdomen and pelvis was performed using the standard protocol following bolus administration of intravenous contrast. CONTRAST:  14mL OMNIPAQUE IOHEXOL 300 MG/ML  SOLN COMPARISON:  12/14/2012 FINDINGS: Lower chest: Lung bases are clear. Hepatobiliary: Liver, gallbladder and biliary tree are normal. Pancreas: Normal. Spleen: Normal. Adrenals/Urinary Tract: Adrenal glands are normal. Kidneys are normal in size. 2 mm nonobstructing stone over the lower pole left kidney. No significant hydronephrosis. Ureters and bladder are normal. Stomach/Bowel: Stomach is normal. There are multiple dilated small bowel loops measuring up to 4.8 cm in diameter. The point of obstruction is at the fascial defect of a moderate size right lower ventral hernia containing multiple dilated small bowel loops. There are 2 adjacent small bowel loops along the right lateral aspect of the fascial defect which are severely narrowed as this is likely the site of obstruction as there may be a closed loop component to this obstruction. This right-sided ventral hernias new  since the prior exam and contains a small amount of free fluid. No evidence of pneumatosis. There is ostomy site over the left lower abdominal wall containing several loops of colon as well as small bowel. No free peritoneal air. Colon is partly decompressed. There is a short segment loop of colon within the right-sided ventral hernia. Vascular/Lymphatic: Minimal calcified plaque over the distal abdominal aorta at the bifurcation. No adenopathy. Reproductive: Normal. Other: None. Musculoskeletal: Degenerative change of the spine. IMPRESSION: Mid small bowel obstruction with point of  obstruction along the right lateral aspect of the fascial defect of a moderate size right ventral hernia. Possible closed loop component to this obstruction. No perforation or pneumatosis. Ostomy over the left lower quadrant as the ostomy defect contain several segments of large and small bowel. Punctate nonobstructing lower pole left renal stone. Aortic Atherosclerosis (ICD10-I70.0). Electronically Signed   By: Marin Olp M.D.   On: 03/17/2019 19:59   Dg Abd Portable 1 View  Result Date: 03/17/2019 CLINICAL DATA:  NG tube placement EXAM: PORTABLE ABDOMEN - 1 VIEW COMPARISON:  CT 03/17/2019 FINDINGS: Residual contrast within the renal collecting systems and bladder. Dilated central small bowel, measuring up to 3.7 cm, consistent with a bowel obstruction. Esophageal tube tip projects over the gastric body. IMPRESSION: Esophageal tube tip overlies the gastric body. Electronically Signed   By: Donavan Foil M.D.   On: 03/17/2019 22:03   Dg Abd Portable 2v  Result Date: 03/18/2019 CLINICAL DATA:  Small bowel obstruction. EXAM: PORTABLE ABDOMEN - 2 VIEW COMPARISON:  Radiograph of March 17, 2019. FINDINGS: Stable position of nasogastric tube seen in expected position of stomach. Small bowel dilatation is again noted concerning for possible distal small bowel obstruction. Phleboliths are noted in the pelvis. Residual contrast is noted in the urinary bladder. IMPRESSION: Stable position of nasogastric tube. Stable small bowel dilatation is noted concerning for possible distal small bowel obstruction. Electronically Signed   By: Marijo Conception M.D.   On: 03/18/2019 07:06    Anti-infectives: Anti-infectives (From admission, onward)   None       Assessment/Plan History of colorectal cancer, s/p multiple abdominal surgeries by Dr. Johney Maine  SBO secondary to incarcerated incisional hernia -repeat films this morning still suggestive of SBO -hernia is fairly hard and she is tender.  Will discuss with  MD  regarding plan.  Stoma isn't putting much out but there is minimal NGT output. -cont NGT for now and will also let Dr. Johney Maine know she is here to get his opinion as well.  FEN - NPO/IVFs/NGT VTE - Lovenox, SCDs ID - none currently   LOS: 1 day    Henreitta Cea , [facility] 03/18/2019, 8:58 AM Pager: [phone]

## 2019-03-18 NOTE — Anesthesia Preprocedure Evaluation (Addendum)
Anesthesia Evaluation  Patient identified by MRN, date of birth, ID band Patient awake    Reviewed: Allergy & Precautions, NPO status , Patient's Chart, lab work & pertinent test results  Airway Mallampati: III  TM Distance: >3 FB Neck ROM: Full    Dental  (+) Teeth Intact, Dental Advisory Given   Pulmonary former smoker,    breath sounds clear to auscultation       Cardiovascular negative cardio ROS   Rhythm:Regular Rate:Normal     Neuro/Psych negative neurological ROS     GI/Hepatic negative GI ROS, Neg liver ROS,   Endo/Other  negative endocrine ROS  Renal/GU negative Renal ROS     Musculoskeletal   Abdominal Normal abdominal exam  (+)   Peds  Hematology negative hematology ROS (+)   Anesthesia Other Findings   Reproductive/Obstetrics                            Lab Results  Component Value Date   WBC 9.6 03/18/2019   HGB 12.8 03/18/2019   HCT 40.0 03/18/2019   MCV 99.8 03/18/2019   PLT 238 03/18/2019   Lab Results  Component Value Date   CREATININE 0.68 03/17/2019   BUN 13 03/17/2019   NA 136 03/17/2019   K 3.8 03/17/2019   CL 104 03/17/2019   CO2 24 03/17/2019   Lab Results  Component Value Date   INR 1.06 03/24/2012     Anesthesia Physical Anesthesia Plan  ASA: II and emergent  Anesthesia Plan: General   Post-op Pain Management:    Induction: Intravenous, Rapid sequence and Cricoid pressure planned  PONV Risk Score and Plan: 4 or greater and Ondansetron, Dexamethasone, Midazolam and Scopolamine patch - Pre-op  Airway Management Planned: Oral ETT  Additional Equipment: None  Intra-op Plan:   Post-operative Plan: Extubation in OR  Informed Consent: I have reviewed the patients History and Physical, chart, labs and discussed the procedure including the risks, benefits and alternatives for the proposed anesthesia with the patient or authorized  representative who has indicated his/her understanding and acceptance.     Dental advisory given  Plan Discussed with: CRNA  Anesthesia Plan Comments:        Anesthesia Quick Evaluation

## 2019-03-18 NOTE — Op Note (Signed)
Operative Note  Pre-operative Diagnosis:  Incarcerated incisional hernia with small bowel obstruction  Post-operative Diagnosis:  same  Surgeon:  Emily Gemma, MD  Assistant:  Romana Juniper, MD   Procedure: Exploratory laparotomy, lysis of adhesions, primary closure of incisional hernia  Anesthesia: General  Estimated Blood Loss: 75 cc  Drains: 19 French Blake drain to subcutaneous space         Specimen: None  Indications: Patient is a 63 year old female admitted from the emergency department with signs and symptoms of small bowel obstruction secondary to an incarcerated incisional hernia.  Patient has a complex past surgical history.  Patient was treated with nasogastric decompression intravenous fluid hydration.  An attempt was made by the admitting surgeon to reduce the hernia.  Patient remained symptomatic with pain at the site.  Patient was therefore prepared and brought to the operating room for exploration.  Procedure Details:  The patient was seen in the pre-op holding area. The risks, benefits, complications, treatment options, and expected outcomes were previously discussed with the patient. The patient agreed with the proposed plan and has signed the informed consent form.  The patient was brought to the operating room by the surgical team, identified as Emily Holloway and the procedure verified. A "time out" was completed and the above information confirmed.  Following administration of general anesthesia, the patient was prepped and draped in the usual aseptic fashion.  After ascertaining that an adequate level of anesthesia been achieved, the previous oblique incision in the right lower quadrant of the abdominal wall was reopened with a #10 blade.  Dissection was carried into subcutaneous tissues using the electrocautery for hemostasis.  A large hernia sac is present.  This is dissected out of the subcutaneous tissues with the electrocautery.  Dissection was carried down to  the fascia circumferentially.  Hernia sac is opened.  It contained several loops of small intestine which appeared moderately dilated.  Hernia sac is dissected away from the bowel.  Sharp dissection of adhesions is performed with the Metzenbaum scissors.  The fascia is identified and the fascia is incised along the previous incision with the electrocautery.  This allows for some mobilization of the small bowel.  Lysis of adhesions was then performed circumferentially around the margins of the hernia defect allowing for delivery of several loops of small bowel for inspection.  The point of obstruction appears to be an area that was densely adhesed to the edge of the hernia defect.  1 small serosal tear was created and this was closed with interrupted 3-0 silk simple sutures.  Several loops of small bowel were mobilized and lysis of adhesions was performed.  There was clearly a transition point at the level of the hernia defect.  Following lysis of adhesions there was easy propulsion of succus through the small bowel into the decompressed distal loops of small bowel.  Viscera were then returned to the peritoneal cavity.  Skin flaps are elevated circumferentially.  Hernia sac was excised and discarded.  Fascial edges were reapproximated with interrupted #1 Novafil simple sutures.  Relaxing incisions were not performed due to the relative attenuation of the abdominal wall.  A 19 [place] Blake drain is brought in from a lateral stab wound and placed in the subcutaneous space to prevent seroma formation.  It is secured to the skin with a 2-0 nylon suture.  Subcutaneous tissues are closed with interrupted 2-0 Vicryl sutures.  Skin is closed with stainless steel staples.  Drain is placed to bulb suction.  Honeycomb dressing is applied to the incision.  Drain sponges placed at the base of the drain.  A new ostomy bag is placed over the ileostomy in the left lower quadrant.  Patient is awakened from anesthesia and brought  to the recovery room.  The patient tolerated the procedure well.   Emily Gemma, MD [facility] Office: [phone]

## 2019-03-18 NOTE — H&P (View-Only) (Signed)
Patient ID: Emily Holloway, female   DOB: 12-Jan-1956, 63 y.o.   MRN: [id number]       Subjective: Patient having quite a bit of abdominal pain and pain in her throat.  Thinks she may have passed a little air this morning in her ileostomy.    Objective: Vital signs in last 24 hours: Temp:  [98.2 F (36.8 C)-98.9 F (37.2 C)] 98.2 F (36.8 C) (08/21 0607) Pulse Rate:  [71-82] 71 (08/21 0607) Resp:  [14-18] 16 (08/21 0012) BP: (123-140)/(65-77) 132/65 (08/21 0607) SpO2:  [97 %-98 %] 98 % (08/21 0607) Weight:  [69.4 kg] 69.4 kg (08/20 1843) Last BM Date: 03/17/19  Intake/Output from previous day: 08/20 0701 - 08/21 0700 In: [id number] [I.V.:174; IV Piggyback:1000] Out: -  Intake/Output this shift: No intake/output data recorded.  PE: Heart: regular Lungs: CTAB Abd: soft, but RLQ hernia is fairly hard and certainly tender.  This is not reducible.  Ileostomy in place.  Unable to see in bag, but does not seem like there is much if any output.  NGT with minimal nonbilious output.  Lab Results:  Recent Labs    03/17/19 1604 03/18/19 0252  WBC 12.3* 9.6  HGB 14.5 12.8  HCT 45.4 40.0  PLT 284 238   BMET Recent Labs    03/17/19 1604  NA 136  K 3.8  CL 104  CO2 24  GLUCOSE 118*  BUN 13  CREATININE 0.68  CALCIUM 9.0   PT/INR No results for input(s): LABPROT, INR in the last 72 hours. CMP     Component Value Date/Time   NA 136 03/17/2019 1604   K 3.8 03/17/2019 1604   CL 104 03/17/2019 1604   CO2 24 03/17/2019 1604   GLUCOSE 118 (H) 03/17/2019 1604   BUN 13 03/17/2019 1604   CREATININE 0.68 03/17/2019 1604   CREATININE 0.66 04/14/2012 1043   CALCIUM 9.0 03/17/2019 1604   PROT 7.6 03/17/2019 1604   ALBUMIN 4.0 03/17/2019 1604   AST 21 03/17/2019 1604   ALT 23 03/17/2019 1604   ALKPHOS 78 03/17/2019 1604   BILITOT 0.7 03/17/2019 1604   GFRNONAA >60 03/17/2019 1604   GFRAA >60 03/17/2019 1604   Lipase     Component Value Date/Time   LIPASE 28 03/17/2019 1604         Studies/Results: Ct Abdomen Pelvis W Contrast  Result Date: 03/17/2019 CLINICAL DATA:  Right-sided abdominal pain since last night with vomiting today. History of colon cancer. Left-sided colostomy. EXAM: CT ABDOMEN AND PELVIS WITH CONTRAST TECHNIQUE: Multidetector CT imaging of the abdomen and pelvis was performed using the standard protocol following bolus administration of intravenous contrast. CONTRAST:  120mL OMNIPAQUE IOHEXOL 300 MG/ML  SOLN COMPARISON:  12/14/2012 FINDINGS: Lower chest: Lung bases are clear. Hepatobiliary: Liver, gallbladder and biliary tree are normal. Pancreas: Normal. Spleen: Normal. Adrenals/Urinary Tract: Adrenal glands are normal. Kidneys are normal in size. 2 mm nonobstructing stone over the lower pole left kidney. No significant hydronephrosis. Ureters and bladder are normal. Stomach/Bowel: Stomach is normal. There are multiple dilated small bowel loops measuring up to 4.8 cm in diameter. The point of obstruction is at the fascial defect of a moderate size right lower ventral hernia containing multiple dilated small bowel loops. There are 2 adjacent small bowel loops along the right lateral aspect of the fascial defect which are severely narrowed as this is likely the site of obstruction as there may be a closed loop component to this obstruction. This right-sided ventral hernias new  since the prior exam and contains a small amount of free fluid. No evidence of pneumatosis. There is ostomy site over the left lower abdominal wall containing several loops of colon as well as small bowel. No free peritoneal air. Colon is partly decompressed. There is a short segment loop of colon within the right-sided ventral hernia. Vascular/Lymphatic: Minimal calcified plaque over the distal abdominal aorta at the bifurcation. No adenopathy. Reproductive: Normal. Other: None. Musculoskeletal: Degenerative change of the spine. IMPRESSION: Mid small bowel obstruction with point of  obstruction along the right lateral aspect of the fascial defect of a moderate size right ventral hernia. Possible closed loop component to this obstruction. No perforation or pneumatosis. Ostomy over the left lower quadrant as the ostomy defect contain several segments of large and small bowel. Punctate nonobstructing lower pole left renal stone. Aortic Atherosclerosis (ICD10-I70.0). Electronically Signed   By: Marin Olp M.D.   On: 03/17/2019 19:59   Dg Abd Portable 1 View  Result Date: 03/17/2019 CLINICAL DATA:  NG tube placement EXAM: PORTABLE ABDOMEN - 1 VIEW COMPARISON:  CT 03/17/2019 FINDINGS: Residual contrast within the renal collecting systems and bladder. Dilated central small bowel, measuring up to 3.7 cm, consistent with a bowel obstruction. Esophageal tube tip projects over the gastric body. IMPRESSION: Esophageal tube tip overlies the gastric body. Electronically Signed   By: Donavan Foil M.D.   On: 03/17/2019 22:03   Dg Abd Portable 2v  Result Date: 03/18/2019 CLINICAL DATA:  Small bowel obstruction. EXAM: PORTABLE ABDOMEN - 2 VIEW COMPARISON:  Radiograph of March 17, 2019. FINDINGS: Stable position of nasogastric tube seen in expected position of stomach. Small bowel dilatation is again noted concerning for possible distal small bowel obstruction. Phleboliths are noted in the pelvis. Residual contrast is noted in the urinary bladder. IMPRESSION: Stable position of nasogastric tube. Stable small bowel dilatation is noted concerning for possible distal small bowel obstruction. Electronically Signed   By: Marijo Conception M.D.   On: 03/18/2019 07:06    Anti-infectives: Anti-infectives (From admission, onward)   None       Assessment/Plan History of colorectal cancer, s/p multiple abdominal surgeries by Dr. Johney Maine  SBO secondary to incarcerated incisional hernia -repeat films this morning still suggestive of SBO -hernia is fairly hard and she is tender.  Will discuss with  MD  regarding plan.  Stoma isn't putting much out but there is minimal NGT output. -cont NGT for now and will also let Dr. Johney Maine know she is here to get his opinion as well.  FEN - NPO/IVFs/NGT VTE - Lovenox, SCDs ID - none currently   LOS: 1 day    Henreitta Cea , [facility], In Surgery 03/18/2019, 8:58 AM Pager: [phone]

## 2019-03-19 ENCOUNTER — Encounter (HOSPITAL_COMMUNITY): Payer: Self-pay | Admitting: Surgery

## 2019-03-19 LAB — CBC
HCT: 39.9 % (ref 36.0–46.0)
Hemoglobin: 12.4 g/dL (ref 12.0–15.0)
MCH: 31.6 pg (ref 26.0–34.0)
MCHC: 31.1 g/dL (ref 30.0–36.0)
MCV: 101.5 fL — ABNORMAL HIGH (ref 80.0–100.0)
Platelets: 211 10*3/uL (ref 150–400)
RBC: 3.93 MIL/uL (ref 3.87–5.11)
RDW: 12.5 % (ref 11.5–15.5)
WBC: 8.9 10*3/uL (ref 4.0–10.5)
nRBC: 0 % (ref 0.0–0.2)

## 2019-03-19 LAB — BASIC METABOLIC PANEL
Anion gap: 7 (ref 5–15)
BUN: <5 mg/dL — ABNORMAL LOW (ref 8–23)
CO2: 26 mmol/L (ref 22–32)
Calcium: 7.9 mg/dL — ABNORMAL LOW (ref 8.9–10.3)
Chloride: 105 mmol/L (ref 98–111)
Creatinine, Ser: 0.58 mg/dL (ref 0.44–1.00)
GFR calc Af Amer: >60 mL/min (ref >60)
GFR calc non Af Amer: >60 mL/min (ref >60)
Glucose, Bld: 142 mg/dL — ABNORMAL HIGH (ref 70–99)
Potassium: 3.4 mmol/L — ABNORMAL LOW (ref 3.5–5.1)
Sodium: 138 mmol/L (ref 135–145)

## 2019-03-19 MED ORDER — PHENOL 1.4 % MT LIQD
2.0000 | OROMUCOSAL | Status: DC | PRN
  Filled 2019-03-19: qty 177

## 2019-03-19 NOTE — Progress Notes (Signed)
1 Day Post-Op open primary incisional hernia repair Subjective: Feels sore, PCA working well.    Objective: Vital signs in last 24 hours: Temp:  [97.5 F (36.4 C)-99 F (37.2 C)] 98.3 F (36.8 C) (08/22 0507) Pulse Rate:  [75-85] 77 (08/22 0507) Resp:  [10-19] 16 (08/22 0507) BP: (114-136)/(64-87) 114/64 (08/22 0507) SpO2:  [97 %-100 %] 98 % (08/22 0507)   Intake/Output from previous day: 08/21 0701 - 08/22 0700 In: 3636.7 [I.V.:3336.7; NG/GT:50; IV Piggyback:250] Out: 985 [Urine:680; Emesis/NG output:150; Drains:55; Blood:100] Intake/Output this shift: No intake/output data recorded.   General appearance: alert and cooperative GI: normal findings: soft, non-tender  Incision: no significant drainage  Lab Results:  Recent Labs    03/18/19 1939 03/19/19 0339  WBC 12.3* 8.9  HGB 12.7 12.4  HCT 40.2 39.9  PLT 241 211   BMET Recent Labs    03/17/19 1604 03/18/19 1939 03/19/19 0339  NA 136  --  138  K 3.8  --  3.4*  CL 104  --  105  CO2 24  --  26  GLUCOSE 118*  --  142*  BUN 13  --  <5*  CREATININE 0.68 0.53 0.58  CALCIUM 9.0  --  7.9*   PT/INR No results for input(s): LABPROT, INR in the last 72 hours. ABG No results for input(s): PHART, HCO3 in the last 72 hours.  Invalid input(s): PCO2, PO2  MEDS, Scheduled . enoxaparin (LOVENOX) injection  40 mg Subcutaneous Q24H  . morphine   Intravenous Q4H  . pantoprazole (PROTONIX) IV  40 mg Intravenous QHS    Studies/Results: Ct Abdomen Pelvis W Contrast  Result Date: 03/17/2019 CLINICAL DATA:  Right-sided abdominal pain since last night with vomiting today. History of colon cancer. Left-sided colostomy. EXAM: CT ABDOMEN AND PELVIS WITH CONTRAST TECHNIQUE: Multidetector CT imaging of the abdomen and pelvis was performed using the standard protocol following bolus administration of intravenous contrast. CONTRAST:  145mL OMNIPAQUE IOHEXOL 300 MG/ML  SOLN COMPARISON:  12/14/2012 FINDINGS: Lower chest: Lung bases  are clear. Hepatobiliary: Liver, gallbladder and biliary tree are normal. Pancreas: Normal. Spleen: Normal. Adrenals/Urinary Tract: Adrenal glands are normal. Kidneys are normal in size. 2 mm nonobstructing stone over the lower pole left kidney. No significant hydronephrosis. Ureters and bladder are normal. Stomach/Bowel: Stomach is normal. There are multiple dilated small bowel loops measuring up to 4.8 cm in diameter. The point of obstruction is at the fascial defect of a moderate size right lower ventral hernia containing multiple dilated small bowel loops. There are 2 adjacent small bowel loops along the right lateral aspect of the fascial defect which are severely narrowed as this is likely the site of obstruction as there may be a closed loop component to this obstruction. This right-sided ventral hernias new since the prior exam and contains a small amount of free fluid. No evidence of pneumatosis. There is ostomy site over the left lower abdominal wall containing several loops of colon as well as small bowel. No free peritoneal air. Colon is partly decompressed. There is a short segment loop of colon within the right-sided ventral hernia. Vascular/Lymphatic: Minimal calcified plaque over the distal abdominal aorta at the bifurcation. No adenopathy. Reproductive: Normal. Other: None. Musculoskeletal: Degenerative change of the spine. IMPRESSION: Mid small bowel obstruction with point of obstruction along the right lateral aspect of the fascial defect of a moderate size right ventral hernia. Possible closed loop component to this obstruction. No perforation or pneumatosis. Ostomy over the left lower quadrant as  the ostomy defect contain several segments of large and small bowel. Punctate nonobstructing lower pole left renal stone. Aortic Atherosclerosis (ICD10-I70.0). Electronically Signed   By: Marin Olp M.D.   On: 03/17/2019 19:59   Dg Abd Portable 1 View  Result Date: 03/17/2019 CLINICAL DATA:  NG  tube placement EXAM: PORTABLE ABDOMEN - 1 VIEW COMPARISON:  CT 03/17/2019 FINDINGS: Residual contrast within the renal collecting systems and bladder. Dilated central small bowel, measuring up to 3.7 cm, consistent with a bowel obstruction. Esophageal tube tip projects over the gastric body. IMPRESSION: Esophageal tube tip overlies the gastric body. Electronically Signed   By: Donavan Foil M.D.   On: 03/17/2019 22:03   Dg Abd Portable 2v  Result Date: 03/18/2019 CLINICAL DATA:  Small bowel obstruction. EXAM: PORTABLE ABDOMEN - 2 VIEW COMPARISON:  Radiograph of March 17, 2019. FINDINGS: Stable position of nasogastric tube seen in expected position of stomach. Small bowel dilatation is again noted concerning for possible distal small bowel obstruction. Phleboliths are noted in the pelvis. Residual contrast is noted in the urinary bladder. IMPRESSION: Stable position of nasogastric tube. Stable small bowel dilatation is noted concerning for possible distal small bowel obstruction. Electronically Signed   By: Marijo Conception M.D.   On: 03/18/2019 07:06    Assessment: s/p Procedure(s): EXPLORATORY LAPAROTOMY, PRIMARY REPAIR OF INCISIONAL HERNIA, LYSIS OF ADHESIONS Patient Active Problem List   Diagnosis Date Noted  . SBO (small bowel obstruction) ([place]) 03/17/2019  . Anemia in chronic illness 04/06/2012  . 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 IBS (irritable bowel syndrome) 03/16/2012  . History of rectal cancer, T2N0, Nov2010 03/27/2011  . Recurrent rectovaginal fistula, s/p re-diversion with end ileostomy Aug2013 02/03/2011    Expected post op course  Plan: COnt NPO, NGT today  COnt PCA OOB to chair today   LOS: 2 days     .Rosario Adie, [facility], [place]   03/19/2019 8:49 AM

## 2019-03-20 NOTE — Progress Notes (Signed)
2 Days Post-Op open primary incisional hernia repair Subjective: C/o pain from NGT.  Having gas in bag.  No n/v.    Objective: Vital signs in last 24 hours: Temp:  [98.4 F (36.9 C)-99.7 F (37.6 C)] 98.4 F (36.9 C) (08/23 0542) Pulse Rate:  [90-101] 101 (08/23 0542) Resp:  [14-18] 15 (08/23 0818) BP: (104-119)/(61-71) 119/71 (08/23 0542) SpO2:  [97 %-100 %] 99 % (08/23 0818)   Intake/Output from previous day: 08/22 0701 - 08/23 0700 In: 1196.5 [I.V.:1196.5] Out: 1645 [Urine:550; Emesis/NG output:250; Drains:45; Stool:800] Intake/Output this shift: No intake/output data recorded.   General appearance: alert and cooperative GI: normal findings: soft, non-tender, stool, gas in bag.   Incision: dressing c/d/i.  Lab Results:  Recent Labs    03/18/19 1939 03/19/19 0339  WBC 12.3* 8.9  HGB 12.7 12.4  HCT 40.2 39.9  PLT 241 211   BMET Recent Labs    03/17/19 1604 03/18/19 1939 03/19/19 0339  NA 136  --  138  K 3.8  --  3.4*  CL 104  --  105  CO2 24  --  26  GLUCOSE 118*  --  142*  BUN 13  --  <5*  CREATININE 0.68 0.53 0.58  CALCIUM 9.0  --  7.9*   PT/INR No results for input(s): LABPROT, INR in the last 72 hours. ABG No results for input(s): PHART, HCO3 in the last 72 hours.  Invalid input(s): PCO2, PO2  MEDS, Scheduled . enoxaparin (LOVENOX) injection  40 mg Subcutaneous Q24H  . morphine   Intravenous Q4H  . pantoprazole (PROTONIX) IV  40 mg Intravenous QHS    Studies/Results: No results found.  Assessment: s/p Procedure(s): EXPLORATORY LAPAROTOMY, PRIMARY REPAIR OF INCISIONAL HERNIA, LYSIS OF ADHESIONS Patient Active Problem List   Diagnosis Date Noted  . SBO (small bowel obstruction) ([place]) 03/17/2019  . Anemia in chronic illness 04/06/2012  . 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 IBS (irritable bowel syndrome) 03/16/2012  . History of rectal cancer, T2N0, Nov2010 03/27/2011  . Recurrent rectovaginal fistula, s/p re-diversion with end ileostomy  Aug2013 02/03/2011    Expected post op course  Plan: D/c NGT Clears Decrease IV fluids. Work on transition to oral pain meds tomorrow if tolerates clears.   LOS: 3 days   Milus Height, MD FACS Surgical Oncology, General Surgery, Trauma and [facility], [place] [phone] Check [email or web address], password [email or web address] for coverage night/weekend  03/20/2019 9:07 AM

## 2019-03-21 ENCOUNTER — Encounter (HOSPITAL_COMMUNITY): Payer: Self-pay | Admitting: Surgery

## 2019-03-21 DIAGNOSIS — K430 Incisional hernia with obstruction, without gangrene: Secondary | ICD-10-CM

## 2019-03-21 DIAGNOSIS — K435 Parastomal hernia without obstruction or  gangrene: Secondary | ICD-10-CM

## 2019-03-21 HISTORY — DX: Incisional hernia with obstruction, without gangrene: K43.0

## 2019-03-21 LAB — BASIC METABOLIC PANEL
Anion gap: 6 (ref 5–15)
BUN: 5 mg/dL — ABNORMAL LOW (ref 8–23)
CO2: 30 mmol/L (ref 22–32)
Calcium: 7.9 mg/dL — ABNORMAL LOW (ref 8.9–10.3)
Chloride: 100 mmol/L (ref 98–111)
Creatinine, Ser: 0.55 mg/dL (ref 0.44–1.00)
GFR calc Af Amer: >60 mL/min (ref >60)
GFR calc non Af Amer: >60 mL/min (ref >60)
Glucose, Bld: 132 mg/dL — ABNORMAL HIGH (ref 70–99)
Potassium: 3.4 mmol/L — ABNORMAL LOW (ref 3.5–5.1)
Sodium: 136 mmol/L (ref 135–145)

## 2019-03-21 LAB — CBC
HCT: 38.3 % (ref 36.0–46.0)
Hemoglobin: 11.8 g/dL — ABNORMAL LOW (ref 12.0–15.0)
MCH: 31.8 pg (ref 26.0–34.0)
MCHC: 30.8 g/dL (ref 30.0–36.0)
MCV: 103.2 fL — ABNORMAL HIGH (ref 80.0–100.0)
Platelets: 206 10*3/uL (ref 150–400)
RBC: 3.71 MIL/uL — ABNORMAL LOW (ref 3.87–5.11)
RDW: 12.3 % (ref 11.5–15.5)
WBC: 4.4 10*3/uL (ref 4.0–10.5)
nRBC: 0 % (ref 0.0–0.2)

## 2019-03-21 LAB — MAGNESIUM: Magnesium: 2.1 mg/dL (ref 1.7–2.4)

## 2019-03-21 MED ORDER — LACTATED RINGERS IV BOLUS: 1000.0000 mL | Freq: Three times a day (TID) | INTRAVENOUS | Status: AC | PRN

## 2019-03-21 MED ORDER — HYDROMORPHONE HCL 1 MG/ML IJ SOLN: 0.5000 mg | INTRAMUSCULAR | Status: DC | PRN

## 2019-03-21 MED ORDER — MORPHINE SULFATE (PF) 2 MG/ML IV SOLN
2.0000 mg | INTRAVENOUS | Status: DC | PRN
  Filled 2019-03-21: qty 2

## 2019-03-21 MED ORDER — METHOCARBAMOL 500 MG PO TABS: 1000.0000 mg | ORAL_TABLET | Freq: Four times a day (QID) | ORAL | Status: DC | PRN

## 2019-03-21 MED ORDER — METHOCARBAMOL 1000 MG/10ML IJ SOLN
1000.0000 mg | Freq: Four times a day (QID) | INTRAVENOUS | Status: DC | PRN
  Filled 2019-03-21: qty 10

## 2019-03-21 MED ORDER — GABAPENTIN 300 MG PO CAPS
300.0000 mg | ORAL_CAPSULE | Freq: Two times a day (BID) | ORAL | Status: DC
  Administered 2019-03-21 – 2019-03-23 (×5): 300 mg via ORAL
  Filled 2019-03-21 (×5): qty 1

## 2019-03-21 MED ORDER — ACETAMINOPHEN 500 MG PO TABS
500.0000 mg | ORAL_TABLET | Freq: Three times a day (TID) | ORAL | Status: DC
  Administered 2019-03-21 – 2019-03-23 (×8): 500 mg via ORAL
  Filled 2019-03-21 (×8): qty 1

## 2019-03-21 MED ORDER — HYDROCODONE-ACETAMINOPHEN 7.5-325 MG PO TABS
1.0000 | ORAL_TABLET | ORAL | Status: DC | PRN
  Administered 2019-03-21 – 2019-03-23 (×6): 1 via ORAL
  Filled 2019-03-21 (×7): qty 1

## 2019-03-21 MED ORDER — ACETAMINOPHEN 500 MG PO TABS: 1000.0000 mg | ORAL_TABLET | Freq: Three times a day (TID) | ORAL | Status: DC

## 2019-03-21 NOTE — Progress Notes (Signed)
3 Days Post-Op   Subjective/Chief Complaint: Complains of soreness   Objective: Vital signs in last 24 hours: Temp:  [98.6 F (37 C)-99.5 F (37.5 C)] 98.8 F (37.1 C) (08/24 0445) Pulse Rate:  [83-102] 83 (08/24 0445) Resp:  [12-18] 16 (08/24 0718) BP: (104-115)/(64-78) 105/69 (08/24 0445) SpO2:  [97 %-100 %] 98 % (08/24 0718) FiO2 (%):  [98 %] 98 % (08/24 0718) Last BM Date: 03/21/19  Intake/Output from previous day: 08/23 0701 - 08/24 0700 In: 1489.3 [P.O.:600; I.V.:889.3] Out: [id number] [Urine:1000; Emesis/NG output:100; Drains:15; Stool:750] Intake/Output this shift: No intake/output data recorded.  General appearance: alert and cooperative Resp: clear to auscultation bilaterally Cardio: regular rate and rhythm GI: soft, moderate tenderness. incision looks good  Lab Results:  Recent Labs    03/18/19 1939 03/19/19 0339  WBC 12.3* 8.9  HGB 12.7 12.4  HCT 40.2 39.9  PLT 241 211   BMET Recent Labs    03/18/19 1939 03/19/19 0339  NA  --  138  K  --  3.4*  CL  --  105  CO2  --  26  GLUCOSE  --  142*  BUN  --  <5*  CREATININE 0.53 0.58  CALCIUM  --  7.9*   PT/INR No results for input(s): LABPROT, INR in the last 72 hours. ABG No results for input(s): PHART, HCO3 in the last 72 hours.  Invalid input(s): PCO2, PO2  Studies/Results: No results found.  Anti-infectives: Anti-infectives (From admission, onward)   Start     Dose/Rate Route Frequency Ordered Stop   03/19/19 0600  ceFAZolin (ANCEF) IVPB 2g/100 mL premix     2 g 200 mL/hr over 30 Minutes Intravenous On call to O.R. 03/18/19 1453 03/18/19 1636      Assessment/Plan: s/p Procedure(s): EXPLORATORY LAPAROTOMY, PRIMARY REPAIR OF INCISIONAL HERNIA, LYSIS OF ADHESIONS (N/A) Advance diet  ambulate  LOS: 4 days    Autumn Messing III 03/21/2019

## 2019-03-21 NOTE — Progress Notes (Addendum)
Emily Holloway [id number] 1955/12/17  CARE TEAM:  PCP: Carol Ada, MD  Outpatient Care Team: Patient Care Team: Carol Ada, MD as PCP - General (Family Medicine) Gatha Mayer, MD as Consulting Physician (Gastroenterology) Michael Boston, MD (General Surgery) Bjorn Loser, MD as Consulting Physician (Urology) Karle Starch, MD as Consulting Physician (Medical Oncology) Cheryll Cockayne, MD as Consulting Physician (Colon and Rectal Surgery) Aviva Signs, MD as Consulting Physician (Gastroenterology)  Inpatient Treatment Team: Treatment Team: Attending Provider: Edison Pace, Md, MD; Attending Physician: Edison Pace Md, MD; Case Manager: Frann Rider, RN; Technician: Revonda Standard, [place]; Consulting Physician: Michael Boston, MD; Technician: Sue Lush, [place]; Registered Nurse: Suzzanne Cloud, RN; Registered Nurse: Jennye Boroughs, RN; Utilization Review: Tressie Stalker, RN   Problem List:   Principal Problem:   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 03/18/2019 Active Problems:   History of rectal cancer, T2N0, Nov2010   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  Wean off PCA as tolerated -PT/OT evals -VTE prophylaxis- SCDs, etc -mobilize as tolerated to help recovery  At some point would benefit from re-eval of remaining parastomal hernia for repair in future.  She is leaning towards that  & wishes to d/w [facility] about it in the future.  We had a long discussion about options.  Do nothing (which I don't think is safe), laparoscopic parastomal incisional hernia repairs with mesh, takedown of ileostomy with possible conversion to have permanent end colostomy relocated in the left upper quadrant away from everything at [facility] for her cancer,, she wishes to work with [facility]/me to consider further surgery.  Her mother still alive in her 79s, so she feels like she has been more decades of life left in her.   45 minutes spent in review, evaluation, examination, counseling, and coordination of care.  More than 50% of that time was spent in counseling.  03/21/2019    Subjective: (Chief complaint)  Using PCA Tol liquids  Objective:  Vital signs:  Vitals:   03/20/19 2329 03/21/19 0434 03/21/19 0445 03/21/19 0718  BP:   105/69   Pulse:   83   Resp: 12 16 18 16   Temp:   98.8 F (37.1 C)   TempSrc:   Oral   SpO2: 98% 99% 100% 98%  Weight:      Height:        Last BM Date: 03/21/19  Intake/Output   Yesterday:  08/23 0701 - 08/24 0700 In: 1489.3 [P.O.:600; I.V.:889.3] Out: [id number] [Urine:1000; Emesis/NG output:100; Drains:15; Stool:750] This shift:  No intake/output data recorded.  Bowel function:  Flatus: YES  BM:  YES  Drain: Serosanguinous   Physical Exam:  General: Pt awake/alert/oriented x4 in no acute distress.  Tired but no toxic Eyes: PERRL, normal EOM.  Sclera clear.  No icterus Neuro: CN II-XII intact w/o focal sensory/motor deficits. Lymph: No head/neck/groin lymphadenopathy Psych:  No delerium/psychosis/paranoia HENT: Normocephalic, Mucus membranes moist.  No thrush Neck: Supple, No tracheal deviation Chest: No chest wall pain w good excursion CV:  Pulses intact.  Regular rhythm MS: Normal AROM mjr joints.  No obvious deformity  Abdomen: Soft.  Mildy distended.  Mildly tender at incisions only.  Ileostomy w prolapse & gas/stool.  No evidence of  peritonitis.  No incarcerated hernias.  Ext:   No deformity.  No mjr edema.  No cyanosis Skin: No petechiae / purpura  Results:   Cultures: Recent Results (from the past 720 hour(s))  SARS Coronavirus 2 [facility] order, Performed in [facility] hospital lab) Nasopharyngeal Nasopharyngeal Swab     Status: None   Collection Time: 03/17/19  9:23 PM   Specimen: Nasopharyngeal Swab  Result Value Ref Range Status   SARS Coronavirus 2 NEGATIVE NEGATIVE Final    Comment: (NOTE) If result is NEGATIVE SARS-CoV-2 target nucleic acids are NOT DETECTED. The SARS-CoV-2 RNA is generally detectable in upper and lower  respiratory specimens during the acute phase of infection. The lowest  concentration of SARS-CoV-2 viral copies this assay can detect is 250  copies / mL. A negative result does not preclude SARS-CoV-2 infection  and should not be used as the sole basis for treatment or other  patient management decisions.  A negative result may occur with  improper specimen collection / handling, submission of specimen other  than nasopharyngeal swab, presence of viral mutation(s) within the  areas targeted by this assay, and inadequate number of viral copies  (<250 copies / mL). A negative result must be combined with clinical  observations, patient history, and epidemiological information. If result is POSITIVE SARS-CoV-2 target nucleic acids are DETECTED. The SARS-CoV-2 RNA is generally detectable in upper and lower  respiratory specimens dur ing the acute phase of infection.  Positive  results are indicative of active infection with SARS-CoV-2.  Clinical  correlation with patient history and other diagnostic information is  necessary to determine patient infection status.  Positive results do  not rule out bacterial infection or co-infection with other viruses. If result is PRESUMPTIVE POSTIVE SARS-CoV-2 nucleic acids MAY BE PRESENT.   A presumptive positive result was obtained on the  submitted specimen  and confirmed on repeat testing.  While 2019 novel coronavirus  (SARS-CoV-2) nucleic acids may be present in the submitted sample  additional confirmatory testing may be necessary for epidemiological  and / or clinical management purposes  to differentiate between  SARS-CoV-2 and other Sarbecovirus currently known to infect humans.  If clinically indicated additional testing with an alternate test  methodology [id number]) is advised. The SARS-CoV-2 RNA is generally  detectable in upper and lower respiratory sp ecimens during the acute  phase of infection. The expected result is Negative. Fact Sheet for Patients:  StrictlyIdeas.no Fact Sheet for Healthcare Providers: BankingDealers.co.za This test is not yet approved or cleared by the [place] FDA and has been authorized for detection and/or diagnosis of SARS-CoV-2 by FDA under an Emergency Use Authorization (EUA).  This EUA will remain in effect (meaning this test can be used) for the duration of the COVID-19 declaration under Section 564(b)(1) of the Act, 21 U.S.C. section 360bbb-3(b)(1), unless the authorization is terminated or revoked sooner. Performed at [facility], [place] [address], [place], [place] [address]     Labs: No results found for this or any previous visit (from the past 48 hour(s)).  Imaging / Studies: No results found.  Medications / Allergies: per chart  Antibiotics: Anti-infectives (From admission, onward)  Start     Dose/Rate Route Frequency Ordered Stop   03/19/19 0600  ceFAZolin (ANCEF) IVPB 2g/100 mL premix     2 g 200 mL/hr over 30 Minutes Intravenous On call to O.R. 03/18/19 1453 03/18/19 1636        Note: Portions of this report may have been transcribed using voice recognition software. Every effort was made to ensure accuracy; however, inadvertent computerized transcription errors may be present.   Any  transcriptional errors that result from this process are unintentional.     Adin Hector, MD, FACS, MASCRS Gastrointestinal and Minimally Invasive Surgery    [address], [place], [place] [address] [phone] Main / Paging [phone] Fax

## 2019-03-21 NOTE — Consult Note (Signed)
[facility] Nurse ostomy consult note Stoma type/location: LLQ end ileostomy Stomal assessment/size: 1 and 1/8 inch prolapsed stoma Peristomal assessment: with parastomal hernia Treatment options for stomal/peristomal skin: piece of skin barrier ring, occasional use of stoma powder for mild contact dermatitis Output: 325 mls emptied from ostomy pouch.  Ostomy pouching: 2pc. 2 and 3/4 inch pouching system  Education provided:  Patient is well known to this Probation officer and we spent quite some time discussing her two ileostomy revisions, the lung cancer diagnosis (5 years ago) and now the right-sided hernia at the former parastomal site which resulted in last week's emergent surgery. She tells me she spoke with Dr. Johney Maine this morning and that following this recovery, she may elect to have her current stoma revised due to the prolapse and a LLQ parastomal hernia formation. Enrolled patient in [facility] program: No. Patient is well established with an ostomy supplier.  Stoma powder and rings are provided to her, she has several skin barriers and pouches (that are opaque) with her from home. She uses [employer] branded products, but not the ones we stock in house.  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  No needs are identified at this time.  Patient is independent in care. She is resuming a soft diet and does not anticipate any needs for the remainder of her admission. She knows how to get in touch with me if she does.  [place] nursing team will not follow, but will remain available to this patient, the nursing and medical teams.  Please re-consult if needed. Thanks, Maudie Flakes, MSN, RN, [place], Arther Abbott  Pager# [phone]

## 2019-03-22 NOTE — TOC Progression Note (Signed)
Transition of Care [facility]) - Progression Note    Patient Details  Name: Emily Holloway MRN: [id number] Date of Birth: 03-28-1956  [facility]) CM/SW Contact  Leeroy Cha, RN Phone Number: 03/22/2019, 4:29 PM  Clinical Narrative:    Request for hhc pt and ot sent to [facility] with [facility].        Expected Discharge Plan and Services                                                 Social Determinants of Health (SDOH) Interventions    Readmission Risk Interventions No flowsheet data found.

## 2019-03-22 NOTE — Progress Notes (Addendum)
4 Days Post-Op  Subjective: CC: Abdominal pain Patient reports she has some pain around her incision but otherwise feels her pain is controllable. Has not been using her PCA. Tolerating a soft diet. Had 1/2 a muffin and some grits this AM. Having bowel function. She reports she lives at home. Is having to walk in the halls with a walker currently because of feeling weak. OT saw and recommended HH and PT consult. She does have her ex-husband who she is still close with that lives close by and could come help her at home.   Objective: Vital signs in last 24 hours: Temp:  [97.9 F (36.6 C)-98.6 F (37 C)] 97.9 F (36.6 C) (08/25 0523) Pulse Rate:  [79-86] 81 (08/25 0523) Resp:  [14-18] 18 (08/25 1130) BP: (88-111)/(56-71) 105/71 (08/25 0523) SpO2:  [93 %-100 %] 99 % (08/25 1130) FiO2 (%):  [93 %-100 %] 99 % (08/25 1130) Last BM Date: 03/22/19  Intake/Output from previous day: 08/24 0701 - 08/25 0700 In: 1316.4 [P.O.:850; I.V.:466.4] Out: 2105 [Urine:1200; Drains:35; Stool:870] Intake/Output this shift: Total I/O In: 280 [P.O.:240; I.V.:40] Out: 1120 [Urine:400; Drains:20; Stool:700]  PE: Gen:  Alert, NAD, pleasant Pulm:  Normal rate and effort Abd: Soft, ND, appropriately tender, +BS. Honeycomb dressing removed. Ex-lap incision with staples in place and appears c/d/i without signs of infection. Drain with SS output, 20 cc/24 hours. Ileostomy with watery brown output, peristomal hernia. Ext:  No LE edema  Psych: A&Ox3  Skin: no rashes noted, warm and dry  Lab Results:  Recent Labs    03/21/19 0826  WBC 4.4  HGB 11.8*  HCT 38.3  PLT 206   BMET Recent Labs    03/21/19 0826  NA 136  K 3.4*  CL 100  CO2 30  GLUCOSE 132*  BUN 5*  CREATININE 0.55  CALCIUM 7.9*   PT/INR No results for input(s): LABPROT, INR in the last 72 hours. CMP     Component Value Date/Time   NA 136 03/21/2019 0826   K 3.4 (L) 03/21/2019 0826   CL 100 03/21/2019 0826   CO2 30  03/21/2019 0826   GLUCOSE 132 (H) 03/21/2019 0826   BUN 5 (L) 03/21/2019 0826   CREATININE 0.55 03/21/2019 0826   CREATININE 0.66 04/14/2012 1043   CALCIUM 7.9 (L) 03/21/2019 0826   PROT 7.6 03/17/2019 1604   ALBUMIN 4.0 03/17/2019 1604   AST 21 03/17/2019 1604   ALT 23 03/17/2019 1604   ALKPHOS 78 03/17/2019 1604   BILITOT 0.7 03/17/2019 1604   GFRNONAA >60 03/21/2019 0826   GFRAA >60 03/21/2019 0826   Lipase     Component Value Date/Time   LIPASE 28 03/17/2019 1604       Studies/Results: No results found.  Anti-infectives: Anti-infectives (From admission, onward)   Start     Dose/Rate Route Frequency Ordered Stop   03/19/19 0600  ceFAZolin (ANCEF) IVPB 2g/100 mL premix     2 g 200 mL/hr over 30 Minutes Intravenous On call to O.R. 03/18/19 1453 03/18/19 1636       Assessment/Plan History of colorectal cancer, s/p multiple abdominal surgeries by Dr. Johney Maine Peristomal hernia - follow up with Dr. Johney Maine per his note   SBO secondary to incarcerated incisional hernia - s/p Ex Lap, LOA, primary closure of incisional hernia - 03/18/2019 - POD 4 - Honeycomb dressing removed - PT/OT consult. OT recommended HH.  - D/c PCA  FEN - Soft, d/c IVF VTE - SCDs, Lovenox  ID - Ancef peri-op Foley - D/c'd POD 1 Follow up -  Dr. Johney Maine     LOS: 5 days    Jillyn Ledger , [facility] 03/22/2019, 11:59 AM Pager: [phone]

## 2019-03-22 NOTE — Discharge Instructions (Signed)

## 2019-03-22 NOTE — Evaluation (Signed)
Physical Therapy Evaluation Patient Details Name: Emily Holloway MRN: [id number] DOB: 04-Feb-1956 Today's Date: 03/22/2019   History of Present Illness  63yo white female who has h/o colorectal cancer treated in 2010 with surgery.  She has had a total of 7 abdominal surgeries for takedown and re-creation of ostomies.  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  She presents with 1 day history of abdominal pain with nausea and vomiting. Now s/p Exploratory laparotomy, lysis of adhesions, primary closure of incisional hernia on 03/18/2019.  Clinical Impression  On eval, pt was Min guard-Min  assist for mobility. She walked ~150 feet with a RW. Mild-mod pain with activity, depending on the task. Discussed HHPT f/u and RW use-pt politely declined both during PT session. Encouraged pt to continue ambulating as tolerated. Will follow during hospital stay.     Follow Up Recommendations Home health PT(pt politely declined PT f/u during PT session)    Equipment Recommendations  Rolling walker with 5" wheels(pt politely declined RW during PT session)    Recommendations for Other Services       Precautions / Restrictions Precautions Precautions: Fall Precaution Comments: ileostomy, jp drain Restrictions Weight Bearing Restrictions: No      Mobility  Bed Mobility               General bed mobility comments: OOB in recliner  Transfers Overall transfer level: Needs assistance Equipment used: None Transfers: Sit to/from Stand Sit to Stand: Min guard;Min assist         General transfer comment: Visitor provided 1 HHA.  Ambulation/Gait Ambulation/Gait assistance: Min guard Gait Distance (Feet): 150 Feet Assistive device: Rolling walker (2 wheeled) Gait Pattern/deviations: Step-through pattern;Decreased stride length     General Gait Details: close guard for safety. slow gait speed.  Stairs            Wheelchair Mobility    Modified Rankin (Stroke Patients Only)       Balance  Overall balance assessment: Needs assistance           Standing balance-Leahy Scale: Fair                               Pertinent Vitals/Pain Pain Assessment: 0-10 Pain Score: 5  Pain Location: abdomen Pain Descriptors / Indicators: Tightness;Sore;Guarding Pain Intervention(s): Limited activity within patient's tolerance;Monitored during session    Home Living Family/patient expects to be discharged to:: Private residence Living Arrangements: Alone Available Help at Discharge: Available PRN/intermittently;Friend(s) Type of Home: House Home Access: Stairs to enter   [employer] of Steps: 1 Home Layout: One level Home Equipment: Hand held shower head;Shower seat;Grab bars - tub/shower;Adaptive equipment Additional Comments: Pt is separated from spouse but he helps out sometimes per her report "he's there on the weekend". Pt also reports church friends who have offered to help as well.     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, newspaper        Extremity/Trunk Assessment   Upper Extremity Assessment Upper Extremity Assessment: Defer to OT evaluation    Lower Extremity Assessment Lower Extremity Assessment: Generalized weakness    Cervical / Trunk Assessment Cervical / Trunk Assessment: Normal  Communication   Communication: No difficulties  Cognition Arousal/Alertness: Awake/alert Behavior During Therapy: WFL for tasks assessed/performed Overall Cognitive Status: Within Functional Limits for tasks assessed  General Comments      Exercises     Assessment/Plan    PT Assessment Patient needs continued PT services  PT Problem List Decreased mobility;Decreased activity tolerance;Decreased balance;Pain       PT Treatment Interventions DME instruction;Therapeutic activities;Therapeutic exercise;Gait  training;Patient/family education;Balance training;Functional mobility training    PT Goals (Current goals can be found in the Care Plan section)  Acute Rehab PT Goals Patient Stated Goal: to regain PLOF/independence PT Goal Formulation: With patient Time For Goal Achievement: 04/05/19 Potential to Achieve Goals: Good    Frequency Min 3X/week   Barriers to discharge        Co-evaluation               AM-PAC PT "6 Clicks" Mobility  Outcome Measure Help needed turning from your back to your side while in a flat bed without using bedrails?: A Little Help needed moving from lying on your back to sitting on the side of a flat bed without using bedrails?: A Little Help needed moving to and from a bed to a chair (including a wheelchair)?: A Little Help needed standing up from a chair using your arms (e.g., wheelchair or bedside chair)?: A Little Help needed to walk in hospital room?: A Little Help needed climbing 3-5 steps with a railing? : A Little 6 Click Score: 18    End of Session   Activity Tolerance: Patient tolerated treatment well Patient left: in chair;with call bell/phone within reach;with family/visitor present   PT Visit Diagnosis: Unsteadiness on feet (R26.81);Pain Pain - part of body: (abdomen)    Time: 1539-1600 PT Time Calculation (min) (ACUTE ONLY): 21 min   Charges:   PT Evaluation $PT Eval Moderate Complexity: [place], PT Acute Rehabilitation Services Pager: [phone] Office: [phone]

## 2019-03-22 NOTE — Progress Notes (Signed)
PCA d/c wasted 16 ml of morphine with Guadlupe Spanish RN.

## 2019-03-22 NOTE — Evaluation (Signed)
Occupational Therapy Evaluation Patient Details Name: Emily Holloway MRN: [id number] DOB: 06-16-1956 Today's Date: 03/22/2019    History of Present Illness 63yo white female who has h/o colorectal cancer treated in 2010 with surgery.  She has had a total of 7 abdominal surgeries for takedown and re-creation of ostomies.  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  She presents with 1 day history of abdominal pain with nausea and vomiting. Now s/p Exploratory laparotomy, lysis of adhesions, primary closure of incisional hernia on 03/18/2019.   Clinical Impression   Pt admitted with the above diagnoses and presents with below problem list. Pt will benefit from continued acute OT to address the below listed deficits and maximize independence with basic ADLs prior to d/c home. PTA pt was independent with ADLs, works from home currently. Pt is currently min guard with functional mobility/transfers, min guard to min A with LB ADLs. Completed household distance functional mobility (equivalent of one end of her house to another and back) with fairly good toleration.      Follow Up Recommendations  Home health OT;Other (comment)(discussed arranging intial 24/7 or close to it)    Equipment Recommendations  None recommended by OT    Recommendations for Other Services PT consult     Precautions / Restrictions Precautions Precautions: Fall Precaution Comments: ileostomy Restrictions Weight Bearing Restrictions: No      Mobility Bed Mobility               General bed mobility comments: OOB with nurse at start of session  Transfers Overall transfer level: Needs assistance Equipment used: Rolling walker (2 wheeled) Transfers: Sit to/from Stand Sit to Stand: Min guard         General transfer comment: from [facility], to/from recliner    Balance Overall balance assessment: Needs assistance         Standing balance support: Bilateral upper extremity supported;No upper extremity supported Standing  balance-Leahy Scale: Fair Standing balance comment: able to static stand with no external support, external support of dynamic standing tasks and ambulation                           ADL either performed or assessed with clinical judgement   ADL Overall ADL's : Needs assistance/impaired Eating/Feeding: Set up;Sitting   Grooming: Set up;Sitting   Upper Body Bathing: Set up;Sitting   Lower Body Bathing: Min guard;Sit to/from stand;Minimal assistance   Upper Body Dressing : Set up;Sitting   Lower Body Dressing: Min guard;Sit to/from stand;Minimal assistance   Toilet Transfer: Min guard;Ambulation   Toileting- Clothing Manipulation and Hygiene: Min guard;Sit to/from stand Toileting - Clothing Manipulation Details (indicate cue type and reason): pt received on [facility] with nurse present. Pt able to complete pericare in sit<>stnad Tub/ Shower Transfer: Walk-in shower;Min guard;Ambulation;Rolling walker   Functional mobility during ADLs: Min guard;Rolling walker General ADL Comments: pt completed pericare, toilet transfer, and household distance functional mobility as detailed above. Discussed energy conservation during ADLs     Vision         Perception     Praxis      Pertinent Vitals/Pain Pain Assessment: 0-10 Pain Score: 3  Pain Location: abdomen Pain Descriptors / Indicators: Tightness;Sore;Guarding Pain Intervention(s): Limited activity within patient's tolerance;Monitored during session;Repositioned     Hand Dominance     Extremity/Trunk Assessment Upper Extremity Assessment Upper Extremity Assessment: Overall WFL for tasks assessed   Lower Extremity Assessment Lower Extremity Assessment: Defer to PT evaluation  Communication Communication Communication: No difficulties   Cognition Arousal/Alertness: Awake/alert Behavior During Therapy: WFL for tasks assessed/performed Overall Cognitive Status: Within Functional Limits for tasks assessed                                      General Comments       Exercises     Shoulder Instructions      Home Living Family/patient expects to be discharged to:: Private residence Living Arrangements: Alone Available Help at Discharge: Available PRN/intermittently;Friend(s) Type of Home: House Home Access: Stairs to enter [employer] of Steps: 1   Home Layout: One level     Bathroom Shower/Tub: Occupational psychologist: Handicapped height     [place]: Hand held shower head;Shower seat;Grab bars - tub/shower;Adaptive equipment Adaptive Equipment: Reacher Additional Comments: Pt is separated from spouse but he helps out sometimes per her report "he's there on the weekend". Pt also reports church friends who have offered to help as well.       Prior Functioning/Environment Level of Independence: Independent        Comments: working from home currently         OT Problem List: Impaired balance (sitting and/or standing);Decreased activity tolerance;Decreased knowledge of use of DME or AE;Decreased knowledge of precautions;Pain      OT Treatment/Interventions: Self-care/ADL training;Therapeutic exercise;Energy conservation;DME and/or AE instruction;Therapeutic activities;Patient/family education;Balance training    OT Goals(Current goals can be found in the care plan section) Acute Rehab OT Goals Patient Stated Goal: today: be off pain pump. overall: home OT Goal Formulation: With patient Time For Goal Achievement: 04/05/19 Potential to Achieve Goals: Good ADL Goals Pt Will Perform Grooming: with modified independence;sitting;standing Pt Will Perform Lower Body Bathing: with modified independence;sit to/from stand Pt Will Perform Lower Body Dressing: with modified independence;sit to/from stand Pt Will Transfer to Toilet: with modified independence;ambulating Pt Will Perform Toileting - Clothing Manipulation and hygiene: with modified  independence;sit to/from stand Pt Will Perform Tub/Shower Transfer: Shower transfer;with supervision;ambulating;3 in 1;rolling walker;with modified independence  OT Frequency: Min 2X/week   Barriers to D/C:            Co-evaluation              AM-PAC OT "6 Clicks" Daily Activity     Outcome Measure Help from another person eating meals?: None Help from another person taking care of personal grooming?: None Help from another person toileting, which includes using toliet, bedpan, or urinal?: A Little Help from another person bathing (including washing, rinsing, drying)?: A Little Help from another person to put on and taking off regular upper body clothing?: None Help from another person to put on and taking off regular lower body clothing?: A Little 6 Click Score: 21   End of Session Equipment Utilized During Treatment: Rolling walker  Activity Tolerance: Patient tolerated treatment well;Patient limited by fatigue Patient left: in chair;with call bell/phone within reach  OT Visit Diagnosis: Unsteadiness on feet (R26.81);Muscle weakness (generalized) (M62.81);Pain                Time: 1100-1125 OT Time Calculation (min): 25 min Charges:  OT General Charges $OT Visit: 1 Visit OT Evaluation $OT Eval Low Complexity: 1 Low OT Treatments $Self Care/Home Management : 8-22 mins  Tyrone Schimke, OT Acute Rehabilitation Services Pager: [phone] Office: [phone]   Hortencia Pilar 03/22/2019, 11:45 AM

## 2019-03-23 MED ORDER — ONDANSETRON 4 MG PO TBDP: 4.0000 mg | ORAL_TABLET | Freq: Four times a day (QID) | ORAL | 0 refills | Status: DC | PRN

## 2019-03-23 MED ORDER — POTASSIUM CHLORIDE CRYS ER 20 MEQ PO TBCR
30.0000 meq | EXTENDED_RELEASE_TABLET | Freq: Two times a day (BID) | ORAL | Status: DC
  Administered 2019-03-23: 30 meq via ORAL
  Filled 2019-03-23: qty 1

## 2019-03-23 MED ORDER — HYDROCODONE-ACETAMINOPHEN 7.5-325 MG PO TABS: 1.0000 | ORAL_TABLET | Freq: Four times a day (QID) | ORAL | 0 refills | Status: DC | PRN

## 2019-03-23 MED ORDER — METHOCARBAMOL 500 MG PO TABS: 1000.0000 mg | ORAL_TABLET | Freq: Four times a day (QID) | ORAL | 0 refills | Status: DC | PRN

## 2019-03-23 NOTE — Progress Notes (Addendum)
5 Days Post-Op  Subjective: CC: Abdominal pain Doing well off PCA. Pain controlled with oral medications. Still having some soreness over incision and drain site. Having output from ileostomy. Tolerating diet without N/V. Mobilizing with walker.   Objective: Vital signs in last 24 hours: Temp:  [97.7 F (36.5 C)-98.7 F (37.1 C)] 97.7 F (36.5 C) (08/26 0545) Pulse Rate:  [79-81] 81 (08/26 0545) Resp:  [16-18] 18 (08/26 0545) BP: (96-114)/(65-71) 96/65 (08/26 0545) SpO2:  [94 %-99 %] 94 % (08/26 0545) FiO2 (%):  [99 %] 99 % (08/25 1130) Last BM Date: 03/22/19  Intake/Output from previous day: 08/25 0701 - 08/26 0700 In: 1187.4 [P.O.:1130; I.V.:57.4] Out: 1909 [Urine:650; Drains:34; [id number] Intake/Output this shift: No intake/output data recorded.  PE: Gen:  Alert, NAD, pleasant Pulm:  Normal rate and effort Abd: Soft, ND, appropriately tender, +BS.  Ex-lap incision with staples in place and appears c/d/i without signs of infection. Drain with SS output, 34cc/24 hours. Ileostomy with watery brown output, peristomal hernia. 1225cc/24 hours.  Ext:  No LE edema  Psych: A&Ox3  Skin: no rashes noted, warm and dry  Lab Results:  Recent Labs    03/21/19 0826  WBC 4.4  HGB 11.8*  HCT 38.3  PLT 206   BMET Recent Labs    03/21/19 0826  NA 136  K 3.4*  CL 100  CO2 30  GLUCOSE 132*  BUN 5*  CREATININE 0.55  CALCIUM 7.9*   PT/INR No results for input(s): LABPROT, INR in the last 72 hours. CMP     Component Value Date/Time   NA 136 03/21/2019 0826   K 3.4 (L) 03/21/2019 0826   CL 100 03/21/2019 0826   CO2 30 03/21/2019 0826   GLUCOSE 132 (H) 03/21/2019 0826   BUN 5 (L) 03/21/2019 0826   CREATININE 0.55 03/21/2019 0826   CREATININE 0.66 04/14/2012 1043   CALCIUM 7.9 (L) 03/21/2019 0826   PROT 7.6 03/17/2019 1604   ALBUMIN 4.0 03/17/2019 1604   AST 21 03/17/2019 1604   ALT 23 03/17/2019 1604   ALKPHOS 78 03/17/2019 1604   BILITOT 0.7 03/17/2019 1604    GFRNONAA >60 03/21/2019 0826   GFRAA >60 03/21/2019 0826   Lipase     Component Value Date/Time   LIPASE 28 03/17/2019 1604       Studies/Results: No results found.  Anti-infectives: Anti-infectives (From admission, onward)   Start     Dose/Rate Route Frequency Ordered Stop   03/19/19 0600  ceFAZolin (ANCEF) IVPB 2g/100 mL premix     2 g 200 mL/hr over 30 Minutes Intravenous On call to O.R. 03/18/19 1453 03/18/19 1636       Assessment/Plan  History of colorectal cancer, s/p multiple abdominal surgeries by Dr. Johney Maine Peristomal hernia - follow up with Dr. Johney Maine per his note   SBO secondary to incarcerated incisional hernia - s/p Ex Lap, LOA, primary closure of incisional hernia - 03/18/2019 - POD 5 - Ileostomy output 1.2L. Monitor. Decrease the amount of sugary drink intake.  - PT/OT recommended HH.  - Will check with MD on drain and timing of removal - Possible d/c later today  FEN - Soft VTE - SCDs, Lovenox  ID - Ancef peri-op Foley - D/c'd POD 1 Follow up -  Dr. Johney Maine (and nurse visit for staple removal)  Plan: Discussed with case manager. HH PT/OT will be arranged later today. Patient is working on being able to get a ride home later today. Will discuss  with MD on timing of drain removal. Likely leave in until follows up for staple removal on 9/4.     LOS: 6 days    Jillyn Ledger , [facility] 03/23/2019, 8:48 AM Pager: [phone]

## 2019-03-23 NOTE — Progress Notes (Signed)
Discharge instructions given to pt and all questions were answered.  

## 2019-03-23 NOTE — TOC Progression Note (Addendum)
Transition of Care [facility]) - Progression Note    Patient Details  Name: Emily Holloway MRN: [id number] Date of Birth: 01-Feb-1956  Transition of Care [facility]) CM/SW Contact  Leeroy Cha, RN Phone Number: 03/23/2019, 10:11 AM  Clinical Narrative:    No area hhc providers will take bcbs commercial for hhc.  Will need oopt md notified via amion. tct-Will Creig Hines pa-understand that pt will have to do pt has an outpt.       Expected Discharge Plan and Services                                                 Social Determinants of Health (SDOH) Interventions    Readmission Risk Interventions No flowsheet data found.

## 2019-03-23 NOTE — Discharge Summary (Signed)
Patient ID: Emily Holloway [id number] 1955/11/11 63 y.o.  Admit date: 03/17/2019 Discharge date: 03/23/2019  Admitting Diagnosis: Small bowel obstruction related to her right-sided incisional hernia  Discharge Diagnosis Patient Active Problem List   Diagnosis Date Noted  . 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 03/18/2019 03/21/2019  . 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 SBO (small bowel obstruction) ([place]) 03/17/2019  . 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 Anemia in chronic illness 04/06/2012  . 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 IBS (irritable bowel syndrome) 03/16/2012  . History of rectal cancer, T2N0, Nov2010 03/27/2011  . Recurrent rectovaginal fistula, s/p re-diversion with end ileostomy Aug2013 02/03/2011    Consultants [place]  Reason for Admission: The patient is a 63 year old white female who has a history of colorectal cancer treated in 2010 with surgery.  She has had a total of 7 abdominal surgeries for takedown and re-creation of ostomies.  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  Her last surgery I believe was in 2014.  She presents with 1 day history of abdominal pain with nausea and vomiting.  She has had known ventral and parastomal hernias since 2013.  She had an episode of right-sided abdominal pain a couple weeks ago but it resolved on its own.  She presents again with right-sided abdominal pain and has had 3 or 4 episodes of vomiting in the last day or so.  She came to the emergency department where a CT scan shows large hernias on both sides and some element of obstruction at the right-sided hernia site.  She denies any fevers or chills.  Her ileostomy has been productive today.  Procedures Dr. Harlow Asa - Exploratory laparotomy, lysis of adhesions, primary closure of incisional hernia - 03/18/2019  Hospital Course:  Patient was admitted to general surgery service for findings as noted above. She was  taken to the OR by Dr. Harlow Asa as above on 03/18/2019 where she was found to have SBO secondary to an incarcerated incisional hernia. He did place a drain in the subq to prevent seroma formation. THe patient tolerated the procedure well and was transferred back to the floor. She was placed on a PCA for pain. On POD 2, NGT was d/c'd and patient was put on CLD. Her diet was advanced and tolerated. She was weaned off PCA. Patient was evaluated by PT/OT who recommended HH. Case management was consulted to arrange this. They were unable to arrange [facility], but did arrange for outpatient PT/OT. Her JP drain output was followed and continued to decreased. THis was removed on 8/26. On POD 5, the patient was voiding well, tolerating diet, ambulating well, pain well controlled, vital signs stable, incisions c/d/i, ileostomy functioning and felt stable for discharge home. Patient to follow up as below.   Physical Exam: Please see progress note from earlier today   Allergies as of 03/23/2019      Reactions   Oxycodone Nausea Only   Sulfonamide Derivatives    REACTION: itching      Medication List    TAKE these medications   HYDROcodone-acetaminophen 7.5-325 MG tablet Commonly known as: NORCO Take 1 tablet by mouth every 6 (six) hours as needed for severe pain.   methocarbamol 500 MG tablet Commonly known as: ROBAXIN Take 2 tablets (1,000 mg total) by mouth every 6 (six) hours as needed for muscle spasms.   ondansetron 4 MG disintegrating tablet Commonly known as: ZOFRAN-ODT Take 1 tablet (4 mg total) by  mouth every 6 (six) hours as needed for nausea.            Durable Medical Equipment  (From admission, onward)         Start     Ordered   03/23/19 0802  For home use only DME Walker rolling  Once    Question:  Patient needs a walker to treat with the following condition  Answer:  Post-op pain   03/23/19 0802           Follow-up Information    Adelaine Roppolo Boston, MD. Go on 04/12/2019.    Specialty: General Surgery Why: 9/15 at 8:30am. Please arrive 30 minutes prior your appointment. Please bring a copy of your photo id and insurance card.  Contact information: [address] [place] [address] [phone]        [facility], [place]. Go on 04/01/2019.   Specialty: General Surgery Why: This will be a nurse visit for staple removal. Please arrive 30 minutes early. Please bring a copy of your photo id and insurance card.  Contact information: [address] [place] [phone]          Signed: Alferd Apa, [facility] 03/23/2019, 3:46 PM Pager: [phone]

## 2019-04-21 ENCOUNTER — Other Ambulatory Visit: Payer: Self-pay

## 2019-04-21 ENCOUNTER — Encounter: Payer: Self-pay | Admitting: Physical Therapy

## 2019-04-21 ENCOUNTER — Ambulatory Visit: Payer: BC Managed Care – PPO | Attending: Surgery | Admitting: Physical Therapy

## 2019-04-21 DIAGNOSIS — R279 Unspecified lack of coordination: Secondary | ICD-10-CM | POA: Insufficient documentation

## 2019-04-21 DIAGNOSIS — M6281 Muscle weakness (generalized): Secondary | ICD-10-CM | POA: Insufficient documentation

## 2019-04-21 DIAGNOSIS — R252 Cramp and spasm: Secondary | ICD-10-CM
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
## 2019-04-21 NOTE — Patient Instructions (Signed)
Access Code: [email or web address]  URL: [email or web address]  Date: 04/21/2019  Prepared by: Jari Favre   Exercises  Supine Single Knee to Chest Stretch - 5 reps - 1 sets - 10 sec hold - 2x daily - 7x weekly  Supine Double Knee to Chest - 5 reps - 1 sets - 10 sec hold - 1x daily - 7x weekly  Supine Diaphragmatic Breathing with Pelvic Floor Lengthening - 10 reps - 1 sets - 3x daily - 7x weekly

## 2019-04-25 ENCOUNTER — Encounter: Payer: Self-pay | Admitting: Physical Therapy

## 2019-04-25 ENCOUNTER — Ambulatory Visit: Payer: BC Managed Care – PPO | Admitting: Physical Therapy

## 2019-04-25 ENCOUNTER — Other Ambulatory Visit: Payer: Self-pay

## 2019-04-25 DIAGNOSIS — R252 Cramp and spasm: Secondary | ICD-10-CM | POA: Diagnosis not present

## 2019-04-25 DIAGNOSIS — M6281 Muscle weakness (generalized): Secondary | ICD-10-CM | POA: Diagnosis not present

## 2019-04-25 DIAGNOSIS — R279 Unspecified lack of coordination: Secondary | ICD-10-CM
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
## 2019-04-26 ENCOUNTER — Encounter: Payer: Self-pay | Admitting: Gynecology

## 2019-04-27 ENCOUNTER — Other Ambulatory Visit: Payer: Self-pay

## 2019-04-27 ENCOUNTER — Encounter: Payer: Self-pay | Admitting: Physical Therapy

## 2019-04-27 ENCOUNTER — Ambulatory Visit: Payer: BC Managed Care – PPO | Admitting: Physical Therapy

## 2019-04-27 DIAGNOSIS — R252 Cramp and spasm: Secondary | ICD-10-CM

## 2019-04-27 DIAGNOSIS — R279 Unspecified lack of coordination: Secondary | ICD-10-CM

## 2019-04-27 DIAGNOSIS — M6281 Muscle weakness (generalized): Secondary | ICD-10-CM
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
## 2019-04-27 NOTE — Patient Instructions (Signed)
Access Code: [email or web address]  URL: [email or web address]  Date: 04/27/2019  Prepared by: Jari Favre   Exercises  Supine Single Knee to Chest Stretch - 5 reps - 1 sets - 10 sec hold - 2x daily - 7x weekly  Supine Diaphragmatic Breathing with Pelvic Floor Lengthening - 10 reps - 1 sets - 3x daily - 7x weekly  Seated Hip Abduction with Pelvic Floor Contraction and Resistance Loop - 15 reps - 1 sets - 2 sec hold - 1x daily - 7x weekly  Seated Pelvic Floor Contraction - 15 reps - 1 sets - 2 sec hold - 1x daily - 7x weekly  Standing Pelvic Floor Contraction - 15 reps - 1 sets - 2 sec hold - 1x daily - 7x weekly

## 2019-05-02 ENCOUNTER — Other Ambulatory Visit: Payer: Self-pay

## 2019-05-02 ENCOUNTER — Ambulatory Visit: Payer: BC Managed Care – PPO | Attending: Surgery | Admitting: Physical Therapy

## 2019-05-02 ENCOUNTER — Encounter: Payer: Self-pay | Admitting: Physical Therapy

## 2019-05-02 DIAGNOSIS — R279 Unspecified lack of coordination: Secondary | ICD-10-CM | POA: Diagnosis not present

## 2019-05-02 DIAGNOSIS — M6281 Muscle weakness (generalized): Secondary | ICD-10-CM | POA: Insufficient documentation

## 2019-05-02 DIAGNOSIS — R252 Cramp and spasm: Secondary | ICD-10-CM
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
## 2019-05-04 ENCOUNTER — Ambulatory Visit: Payer: BC Managed Care – PPO | Admitting: Physical Therapy

## 2019-05-04 ENCOUNTER — Other Ambulatory Visit: Payer: Self-pay

## 2019-05-04 ENCOUNTER — Encounter: Payer: Self-pay | Admitting: Physical Therapy

## 2019-05-04 DIAGNOSIS — R279 Unspecified lack of coordination: Secondary | ICD-10-CM | POA: Diagnosis not present

## 2019-05-04 DIAGNOSIS — R252 Cramp and spasm: Secondary | ICD-10-CM | POA: Diagnosis not present

## 2019-05-04 DIAGNOSIS — M6281 Muscle weakness (generalized): Secondary | ICD-10-CM
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
## 2019-05-10 ENCOUNTER — Ambulatory Visit: Payer: BC Managed Care – PPO | Admitting: Physical Therapy

## 2019-05-10 ENCOUNTER — Other Ambulatory Visit: Payer: Self-pay

## 2019-05-10 ENCOUNTER — Encounter: Payer: Self-pay | Admitting: Physical Therapy

## 2019-05-10 DIAGNOSIS — R252 Cramp and spasm: Secondary | ICD-10-CM | POA: Diagnosis not present

## 2019-05-10 DIAGNOSIS — M6281 Muscle weakness (generalized): Secondary | ICD-10-CM | POA: Diagnosis not present

## 2019-05-10 DIAGNOSIS — R279 Unspecified lack of coordination: Secondary | ICD-10-CM | POA: Diagnosis not present
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
## 2019-05-17 ENCOUNTER — Encounter: Payer: Self-pay | Admitting: Physical Therapy

## 2019-05-17 ENCOUNTER — Ambulatory Visit: Payer: BC Managed Care – PPO | Admitting: Physical Therapy

## 2019-05-17 ENCOUNTER — Other Ambulatory Visit: Payer: Self-pay

## 2019-05-17 DIAGNOSIS — R279 Unspecified lack of coordination: Secondary | ICD-10-CM

## 2019-05-17 DIAGNOSIS — R252 Cramp and spasm: Secondary | ICD-10-CM | POA: Diagnosis not present

## 2019-05-17 DIAGNOSIS — M6281 Muscle weakness (generalized): Secondary | ICD-10-CM
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
## 2019-05-17 NOTE — Patient Instructions (Signed)
[id number] updated

## 2019-05-23 ENCOUNTER — Encounter: Payer: Self-pay | Admitting: Physical Therapy

## 2019-05-23 ENCOUNTER — Ambulatory Visit: Payer: BC Managed Care – PPO | Admitting: Physical Therapy

## 2019-05-23 ENCOUNTER — Other Ambulatory Visit: Payer: Self-pay

## 2019-05-23 DIAGNOSIS — M6281 Muscle weakness (generalized): Secondary | ICD-10-CM | POA: Diagnosis not present

## 2019-05-23 DIAGNOSIS — R252 Cramp and spasm: Secondary | ICD-10-CM

## 2019-05-23 DIAGNOSIS — R279 Unspecified lack of coordination: Secondary | ICD-10-CM

## 2019-05-23 NOTE — Patient Instructions (Signed)
Access Code: [email or web address]  URL: [email or web address]  Date: 05/23/2019  Prepared by: Jari Favre   Exercises  Supine Single Knee to Chest Stretch - 5 reps - 1 sets - 10 sec hold - 2x daily - 7x weekly  Supine Diaphragmatic Breathing with Pelvic Floor Lengthening - 10 reps - 1 sets - 3x daily - 7x weekly  Seated Hip Abduction with Pelvic Floor Contraction and Resistance Loop - 15 reps - 1 sets - 3 sec hold - 1x daily - 7x weekly  Seated Pelvic Floor Contraction - 15 reps - 1 sets - 3 sec hold - 1x daily - 7x weekly  Standing Pelvic Floor Contraction - 15 reps - 1 sets - 3 sec hold - 1x daily - 7x weekly  Walking with Pelvic Floor Contraction - 20 reps - 1 sets - 3 sec hold - 1x daily - 7x weekly  Sit to Stand with Pelvic Floor Contraction - 10 reps - 2 sets - 1x daily - 7x weekly  Seated Figure 4 Piriformis Stretch - 10 reps - 3 sets - 1x daily - 7x weekly  Seated Hamstring Stretch - 3 reps - 1 sets - 30 sec hold - 1x daily - 7x weekly  Supine Bridge with Pelvic Floor Contraction - 10 reps - 3 sets - 3 sec hold - 1x daily - 7x weekly
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
## 2019-05-31 ENCOUNTER — Ambulatory Visit: Payer: BC Managed Care – PPO | Attending: Surgery | Admitting: Physical Therapy

## 2019-05-31 ENCOUNTER — Encounter: Payer: Self-pay | Admitting: Physical Therapy

## 2019-05-31 ENCOUNTER — Other Ambulatory Visit: Payer: Self-pay

## 2019-05-31 DIAGNOSIS — R279 Unspecified lack of coordination: Secondary | ICD-10-CM | POA: Insufficient documentation

## 2019-05-31 DIAGNOSIS — R252 Cramp and spasm: Secondary | ICD-10-CM | POA: Diagnosis not present

## 2019-05-31 DIAGNOSIS — M6281 Muscle weakness (generalized): Secondary | ICD-10-CM | POA: Insufficient documentation
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
## 2019-05-31 NOTE — Patient Instructions (Signed)
Relaxation Exercises with the Urge to Void   When you experience an urge to void:  FIRST  Stop and stand very still    Sit down if you can    Don't move    You need to stay very still to maintain control  SECOND Squeeze your pelvic floor muscles 5 times, like a quick flick, to keep from leaking  THIRD Relax  Take a deep breath and then let it out  Try to make the urge go away by using relaxation and visualization techniques  FINALLY When you feel the urge go away somewhat, walk normally to the bathroom.   If the urge gets suddenly stronger on the way, you may stop again and relax to regain control.  [facility] [address], [address] [place], [place] [address] Phone # [phone] Fax [phone]  

## 2019-06-07 ENCOUNTER — Ambulatory Visit: Payer: BC Managed Care – PPO | Admitting: Physical Therapy

## 2019-06-07 ENCOUNTER — Other Ambulatory Visit: Payer: Self-pay

## 2019-06-07 DIAGNOSIS — R252 Cramp and spasm: Secondary | ICD-10-CM | POA: Diagnosis not present

## 2019-06-07 DIAGNOSIS — R279 Unspecified lack of coordination: Secondary | ICD-10-CM

## 2019-06-07 DIAGNOSIS — M6281 Muscle weakness (generalized): Secondary | ICD-10-CM | POA: Diagnosis not present
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
## 2019-06-08 DIAGNOSIS — L818 Other specified disorders of pigmentation: Secondary | ICD-10-CM | POA: Diagnosis not present

## 2019-06-08 DIAGNOSIS — L570 Actinic keratosis: Secondary | ICD-10-CM | POA: Diagnosis not present

## 2019-06-08 DIAGNOSIS — X32XXXD Exposure to sunlight, subsequent encounter: Secondary | ICD-10-CM | POA: Diagnosis not present

## 2019-06-08 DIAGNOSIS — D2271 Melanocytic nevi of right lower limb, including hip: Secondary | ICD-10-CM | POA: Diagnosis not present

## 2019-06-08 DIAGNOSIS — D225 Melanocytic nevi of trunk: Secondary | ICD-10-CM | POA: Diagnosis not present

## 2019-06-08 DIAGNOSIS — D485 Neoplasm of uncertain behavior of skin: Secondary | ICD-10-CM | POA: Diagnosis not present

## 2019-06-08 DIAGNOSIS — L821 Other seborrheic keratosis: Secondary | ICD-10-CM | POA: Diagnosis not present

## 2019-06-14 ENCOUNTER — Ambulatory Visit: Payer: BC Managed Care – PPO | Admitting: Physical Therapy

## 2019-06-14 ENCOUNTER — Encounter: Payer: Self-pay | Admitting: Physical Therapy

## 2019-06-14 ENCOUNTER — Other Ambulatory Visit: Payer: Self-pay

## 2019-06-14 DIAGNOSIS — M6281 Muscle weakness (generalized): Secondary | ICD-10-CM

## 2019-06-14 DIAGNOSIS — R252 Cramp and spasm: Secondary | ICD-10-CM

## 2019-06-14 DIAGNOSIS — R279 Unspecified lack of coordination: Secondary | ICD-10-CM | POA: Diagnosis not present
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
## 2019-06-15 DIAGNOSIS — Z932 Ileostomy status: Secondary | ICD-10-CM | POA: Diagnosis not present

## 2019-07-05 ENCOUNTER — Other Ambulatory Visit: Payer: Self-pay | Admitting: Surgery

## 2019-07-05 ENCOUNTER — Ambulatory Visit: Payer: Self-pay | Admitting: Surgery

## 2019-07-08 ENCOUNTER — Other Ambulatory Visit: Payer: Self-pay | Admitting: Surgery

## 2019-07-08 DIAGNOSIS — N823 Fistula of vagina to large intestine: Secondary | ICD-10-CM

## 2019-07-11 ENCOUNTER — Encounter: Payer: BC Managed Care – PPO | Admitting: Physical Therapy

## 2019-07-12 ENCOUNTER — Encounter: Payer: Self-pay | Admitting: Physical Therapy

## 2019-07-12 ENCOUNTER — Other Ambulatory Visit: Payer: Self-pay

## 2019-07-12 ENCOUNTER — Ambulatory Visit: Payer: BC Managed Care – PPO | Attending: Surgery | Admitting: Physical Therapy

## 2019-07-12 DIAGNOSIS — R252 Cramp and spasm: Secondary | ICD-10-CM | POA: Insufficient documentation

## 2019-07-12 DIAGNOSIS — R279 Unspecified lack of coordination: Secondary | ICD-10-CM | POA: Diagnosis not present

## 2019-07-12 DIAGNOSIS — M6281 Muscle weakness (generalized): Secondary | ICD-10-CM
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
## 2019-08-08 ENCOUNTER — Encounter: Payer: BC Managed Care – PPO | Admitting: Physical Therapy

## 2019-08-09 ENCOUNTER — Ambulatory Visit
Admission: RE | Admit: 2019-08-09 | Discharge: 2019-08-09 | Disposition: A | Discharge status: Home / Self Care | Payer: BC Managed Care – PPO | Source: Ambulatory Visit | Attending: Surgery | Admitting: Surgery

## 2019-08-09 DIAGNOSIS — N823 Fistula of vagina to large intestine: Secondary | ICD-10-CM | POA: Diagnosis not present

## 2019-08-10 DIAGNOSIS — Z932 Ileostomy status: Secondary | ICD-10-CM | POA: Diagnosis not present

## 2019-08-12 ENCOUNTER — Other Ambulatory Visit: Payer: Self-pay | Admitting: Surgery

## 2019-08-12 DIAGNOSIS — N823 Fistula of vagina to large intestine: Secondary | ICD-10-CM

## 2019-08-15 ENCOUNTER — Ambulatory Visit: Payer: BC Managed Care – PPO | Admitting: Physical Therapy

## 2019-08-25 ENCOUNTER — Encounter: Payer: Self-pay | Admitting: Physical Therapy

## 2019-08-25 ENCOUNTER — Other Ambulatory Visit: Payer: Self-pay

## 2019-08-25 ENCOUNTER — Ambulatory Visit: Payer: BC Managed Care – PPO | Attending: Surgery | Admitting: Physical Therapy

## 2019-08-25 DIAGNOSIS — M6281 Muscle weakness (generalized): Secondary | ICD-10-CM | POA: Diagnosis not present

## 2019-08-25 DIAGNOSIS — R252 Cramp and spasm: Secondary | ICD-10-CM | POA: Insufficient documentation

## 2019-08-25 DIAGNOSIS — R279 Unspecified lack of coordination: Secondary | ICD-10-CM | POA: Diagnosis not present
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
## 2019-09-06 DIAGNOSIS — Z932 Ileostomy status: Secondary | ICD-10-CM | POA: Diagnosis not present

## 2019-09-09 ENCOUNTER — Other Ambulatory Visit: Payer: Self-pay

## 2019-09-09 ENCOUNTER — Ambulatory Visit
Admission: RE | Admit: 2019-09-09 | Discharge: 2019-09-09 | Disposition: A | Discharge status: Home / Self Care | Payer: BC Managed Care – PPO | Source: Ambulatory Visit | Attending: Surgery | Admitting: Surgery

## 2019-09-09 ENCOUNTER — Other Ambulatory Visit: Payer: Self-pay | Admitting: Surgery

## 2019-09-09 DIAGNOSIS — N823 Fistula of vagina to large intestine: Secondary | ICD-10-CM

## 2019-09-09 MED ORDER — GADOBENATE DIMEGLUMINE 529 MG/ML IV SOLN
13.0000 mL | Freq: Once | INTRAVENOUS | Status: AC | PRN
  Administered 2019-09-09: 15:00:00 13 mL via INTRAVENOUS

## 2019-09-09 NOTE — Addendum Note (Signed)
Encounter addended by: Maura Crandall on: 09/09/2019 3:14 PM  Actions taken: Imaging Exam begun, Order list changed, MAR administration accepted

## 2019-09-13 ENCOUNTER — Ambulatory Visit: Payer: Self-pay | Admitting: Surgery

## 2019-09-13 DIAGNOSIS — K9419 Other complications of enterostomy: Secondary | ICD-10-CM | POA: Diagnosis not present

## 2019-09-13 DIAGNOSIS — K624 Stenosis of anus and rectum: Secondary | ICD-10-CM | POA: Diagnosis not present

## 2019-09-13 DIAGNOSIS — K435 Parastomal hernia without obstruction or  gangrene: Secondary | ICD-10-CM | POA: Diagnosis not present

## 2019-09-13 DIAGNOSIS — K430 Incisional hernia with obstruction, without gangrene: Secondary | ICD-10-CM | POA: Diagnosis not present

## 2019-09-13 NOTE — H&P (Addendum)
MATTISEN WYNDER Documented: 09/13/2019 11:25 AM Location: [facility] Patient #: [id number] DOB: 04-30-1956 Married / Language: Cleophus Molt / Race: White Female  History of Present Illness Adin Hector MD; 09/13/2019 12:33 PM) The patient is a 64 year old female who presents for a hernia surgery post-op. Note for "Hernia surgery post-op": ` ` ` The patient returns s/p emergent lysis of adhesions with reduction of incarcerated incisional hernia and primary repair by Dr. Harlow Asa 03/18/2019      The patient returns to clinic after surgery, gradually improving. Pain from the incisions is fading away. Denies nausea, constipation/diarrhea, worsening fatigue, high fevers, or other concerns. 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 It still has moderate bulging there. She is emptying the bag 6-8 times a day. Appetite better. Energy level better. Not taking any antidiarrheals. Soreness gone down. We had a long conversation in the hospital after the urgent surgery. Wished to reestablish with me again. She is concerned that she has persistent bulging around her ileostomy. She is does not want to go through the chance of having worsening hernia or another episode of incarceration at this one. Feels like surgery needed to fix this.  Patient diagnosis very distal rectal cancer status post laparoscopic low anterior resection with J puch coloanal anastomosis and loop ileostomy diversion. November 2010. Wounds recurrence. Had Loop ileostomy takedown. Had difficulty with diarrhea and developed a rectovaginal fistula. She was rediverted. Biopsy disproves cancer. An advancement flap closure. Martius labial fat pad repair with radiology. Ileostomy takedown parent recurrent symptoms and concern for fistula. Converted to hand ileostomy and left lower quadrant. 2013. Sent for second opinion at [facility] Dr Morton Stall. No evidence of rectovaginal fistula on 2014. However  she was found to have a solitary lung mass was resected consistent with metastatic disease. She decided hold off on reconsidering any ostomy takedown. Post-adjuvant chemotherapy. No evidence of any dizziness recurrence for the past 6 years. She did have some bulging around her ileostomy when I saw her 3 years ago. Then she developed bulging at her old right lower quadrant ostomy site/incisional hernia. She came in obstructed requiring emergency surgery by our group 02/2019. She comes in for follow-up. She has had underwhelming colonoscopies 2015 and 2018.  We have discussed options. Her end ileostomy parastomal hernias gotten larger. She is undergone pelvic for physical therapy with improvement. She had an MRI of the pelvis showed no evidence of rectovaginal fistula nor local recurrence. Labial fat pad in place. Thinning but no definite evidence of recurrent fistula. We did a methylene blue retention enema transanally. No evidence of any staining on the vaginal tampon after holding for 10 minutes.   ` ` ADDENDUM REPORT: 09/12/2019 11:44 ADDENDUM: These results were discussed with Dr. Johney Maine by telephone. The above-described right puborectalis muscule defect with herniation of fat is consistent with patient's past surgical history of a Martius flap procedure. Electronically Signed By: Marlaine Hind M.D. On: 09/12/2019 11:44 Addended by Earle Gell, MD on 09/12/2019 11:47 AM  Study Result CLINICAL DATA: Tenesmus. 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 Previous low anterior resection for rectal carcinoma. Evaluate for rectocele, rectal fistula, and recurrent carcinoma. Creatinine was obtained on site at [facility] at [address] Results: Creatinine 0.7 mg/dL. EXAM: MRI PELVIS WITHOUT AND WITH CONTRAST TECHNIQUE: Multiplanar multisequence MR imaging of the pelvis was performed both before and after administration of intravenous contrast. Exam was  initially performed on 08/09/2019 without IV contrast following the pelvic  organ prolapse/defecography protocol, after administration of ultrasound gel per rectum . The patient subsequently returned on 09/09/2019 for exam performed without and with IV contrast following the protocol for evaluation for fistula and recurrent carcinoma. CONTRAST: 13 mL MultiHance COMPARISON: None. FINDINGS: Lower Urinary Tract: Unremarkable urinary bladder. A small defect is seen in the right puborectalis muscle with herniation of fat along the right lateral aspect of the distal urethra and inferior vagina. Bowel: Postop changes are seen from previous low anterior resection. Cicatrization of the rectum to the left anterior mesorectal fascia. The anterior rectal wall is inseparable from the posterior vaginal wall, and there is a traction diverticulum of the anterior rectal wall dislocation at this location, however, no fluid or gas is seen within the vagina. There is no evidence of perianal fistula or abscess. There is no evidence recurrent soft tissue mass involving the rectum or anus. Incidental note is made of a large left lower quadrant parastomal hernia at colostomy site. Vascular/Lymphatic: Multiple small lymph nodes are seen within the perirectal fat, largest measuring 6 mm in short axis (09/09/2019, image 13/series 10). No extra mesorectal lymphadenopathy identified. Reproductive: Small postmenopausal uterus and ovaries are seen. No evidence of pelvic mass or free fluid. Other: None. Musculoskeletal: No significant abnormality identified. Measurements at Rest: H line: 4.7 cm M line: 1.4 cm Levator plate angle: 14 degrees Measurements during Defecation: H line: 5.8 cm M line: 3.5 cm Levator plate angle: 30 degrees ANTERIOR COMPARTMENT Bladder base descent below PCL: None Cystocele grade: None Anterior urethral hypermobility >30 degrees: No MIDDLE COMPARTMENT Uterus descent  below PCL: None CUL-DE-SAC Enterocele/Peritoneocele/Sigmoidocele: None POSTERIOR COMPARTMENT Rectocele: None Rectocele Grade: None Intussception: No Other Functional Abnormalities noted during defecation: Incomplete rectal emptying Paradoxical puborectal contraction/Anismus: No Defects in Levator Ani Musculature: None IMPRESSION: 1. No evidence of recurrent soft tissue mass involving the rectum or anus. Nonspecific shotty perirectal lymph nodes, largest measuring 6 mm. 2. No evidence of perianal fistula or abscess. 3. Postop changes, with cicatrization of rectum to left anterior mesorectal fascia and posterior vaginal wall. A traction diverticulum of the anterior rectal wall is seen at this location, however there is no definite evidence of persistent rectovaginal fistula. 4. Defect in the right puborectalis muscle, with herniation of fat along the right lateral aspect of the distal urethra and inferior vagina. 5. Incomplete rectal emptying with defecation, but no evidence of cystocele, uterine prolapse, or rectocele. 6. Large left lower quadrant parastomal hernia. Electronically Signed: By: Marlaine Hind M.D. On: 09/12/2019 10:27  Result History  MR PELVIS W WO CONTRAST (Order [id number]) on 09/12/2019 - Order Result History Report - Result Edited <[email or web address]> MyChart Results Release  MyChart Status: Active Results Release <[email or web address]> Encounter-Level Documents - 08/09/2019:  Electronic signature on 08/09/2019 10:53 AM - E-signed <[email or web address]> Scan on 08/09/2019 11:38 AM by Default, Provider, MD <[email or web address]>Scan on 08/09/2019 11:38 AM by Default, Provider, MD Scan on 08/05/2019 2:02 PM by Vernon Prey: benefits <[email or web address]>Scan on 08/05/2019 2:02 PM by Vernon Prey: benefits Scan on 08/05/2019 2:02 PM by Vernon PreyJosem Kaufmann <[email or web address]>Scan on 08/05/2019 2:02 PM by Vernon PreyJosem Kaufmann Scan on  07/08/2019 2:51 PM by Default, Provider, MD <[email or web address]>Scan on 07/08/2019 2:51 PM by Default, Provider, MD   Order-Level Documents:  There are no order-level documents. Orders Requiring a Screening Form  Procedure Order Status Form Status MR PELVIS W WO CONTRAST <[email or web address]> Completed Completed Vitals  Height Weight BMI (Calculated)  Protocol Documents  Imaging Protocol <[email or web address]://[email or web address]> Imaging  Imaging Information <[email or web address]://[email or web address]> Resulted by:  Signed  Date/Time Phone Pager Earle Gell 09/12/2019 10:27 AM [id number] [phone] Study Notes <epic://[id number]  Samuel Jester D on 09/09/2019 2:48 PM MR Pelvis wo/w: 13 ml multihance Labs @315 , Creat=0.7, GFR=94 Pt came back for repeat study today. Per Dr. Kris Hartmann needs to be fistula protocol. Images attached to prior exam. [place]  Braden, Lowry Bowl, RT on 08/09/2019 11:39 AM Mri pelvis/defacography 120 cc gel inserted. Max tolerated. Hx of colon cancer resection. Recurrent rectovaginal fistula. Patient evacuated as much gel as possible. Repeated max evacuate series several times. bb       Operative Note  Pre-operative Diagnosis: Incarcerated incisional hernia with small bowel obstruction  Post-operative Diagnosis: same  Surgeon: Armandina Gemma, MD  Assistant: Romana Juniper, MD   Procedure: Exploratory laparotomy, lysis of adhesions, primary closure of incisional hernia  Anesthesia: General  Estimated Blood Loss: 75 cc  Drains: 19 French Blake drain to subcutaneous space  Specimen: None  Indications: Patient is a 64 year old female admitted from the emergency department with signs and symptoms of small bowel obstruction secondary to an incarcerated incisional hernia. Patient has a complex past surgical history. Patient was treated with nasogastric decompression intravenous fluid hydration. An attempt was made by the admitting surgeon to  reduce the hernia. Patient remained symptomatic with pain at the site. Patient was therefore prepared and brought to the operating room for exploration.  Procedure Details: The patient was seen in the pre-op holding area. The risks, benefits, complications, treatment options, and expected outcomes were previously discussed with the patient. The patient agreed with the proposed plan and has signed the informed consent form. The patient was brought to the operating room by the surgical team, identified as Charolotte Eke and the procedure verified. A "time out" was completed and the above information confirmed.  Following administration of general anesthesia, the patient was prepped and draped in the usual aseptic fashion. After ascertaining that an adequate level of anesthesia been achieved, the previous oblique incision in the right lower quadrant of the abdominal wall was reopened with a #10 blade. Dissection was carried into subcutaneous tissues using the electrocautery for hemostasis. A large hernia sac is present. This is dissected out of the subcutaneous tissues with the electrocautery. Dissection was carried down to the fascia circumferentially. Hernia sac is opened. It contained several loops of small intestine which appeared moderately dilated. Hernia sac is dissected away from the bowel. Sharp dissection of adhesions is performed with the Metzenbaum scissors. The fascia is identified and the fascia is incised along the previous incision with the electrocautery. This allows for some mobilization of the small bowel. Lysis of adhesions was then performed circumferentially around the margins of the hernia defect allowing for delivery of several loops of small bowel for inspection. The point of obstruction appears to be an area that was densely adhesed to the edge of the hernia defect. 1 small serosal tear was created and this was closed with interrupted 3-0 silk simple sutures. Several  loops of small bowel were mobilized and lysis of adhesions was performed. There was clearly a transition point at the level of the hernia defect. Following lysis of adhesions there was easy propulsion of succus through the small bowel into the decompressed distal loops of small bowel. Viscera were then returned to the peritoneal cavity.  Skin flaps are elevated circumferentially. Hernia sac was excised and discarded. Fascial edges were reapproximated with interrupted #1 Novafil simple sutures. Relaxing incisions were not performed due to the relative attenuation of the abdominal wall. A 19 [place]  Keenan Bachelor drain is brought in from a lateral stab wound and placed in the subcutaneous space to prevent seroma formation. It is secured to the skin with a 2-0 nylon suture. Subcutaneous tissues are closed with interrupted 2-0 Vicryl sutures. Skin is closed with stainless steel staples. Drain is placed to bulb suction.  Honeycomb dressing is applied to the incision. Drain sponges placed at the base of the drain. A new ostomy bag is placed over the ileostomy in the left lower quadrant. Patient is awakened from anesthesia and brought to the recovery room. The patient tolerated the procedure well.   Armandina Gemma, MD [facility] Office: [place] [place], [id number]    OPERATIVE PROCEDURE REPORT - 03/07/2014   PATIENT: Yolandra, Masella MR #: [id number]  BIRTHDATE: 05-Dec-1955 GENDER: Female  ATTENDING: Lenise Arena M.D. REFERRING MD:  ASSISTANT: Morganne Doepker and Garment/textile technologist GIT    PROCEDURE: 1. surveillance colonoscopy   INDICATIONS: 1. Screening, high risk, personal history of rectal cancer    MEDICATIONS: See Nurse Report. Fentanyl 100 mcg IV Versed 4 mg IV   DESCRIPTION OF PROCEDURE:  After the  risks, benefits, and alternatives of the procedure were thoroughly  explained, Informed consent was obtained. A digital rectal exam was performed  and revealed tenderness of the rectum. The [id number] endoscope was  introduced through the anus and advanced to the mid transverse colon. The prep  was poor. The instrument was then slowly withdrawn as the colon was fully  examined.      COLON FINDINGS: Non-bleeding mucosal ulceration, continuous across the area  examined, was present in the rectum. Multiple sessile polyps ranging between  3-54mm in size were found in the descending colon. Snare cautery Polypectomy  was performed. The resections were incomplete and the polyp tissue was  partially retrieved. There was evidence of a hyperemic appearing prior  surgical anastomosis in the rectum. Retroflexion not performed.. The scope  was then completely withdrawn from the patient and the procedure terminated.    LIMITATIONS: Without limitations  COMPLICATIONS: There were no complications.   ENDOSCOPIC IMPRESSION: 1. Non-bleeding mucosal ulceration in the rectum  2. Multiple sessile polyps ranging between 3-53mm in size were found in the  descending colon; Polypectomy was performed  3. There was evidence of prior surgical anastomosis in the rectum  4. Retroflexion not performed.   RECOMMENDATION: Await biopsy results  PATHOLOGY: Pathology - Biopsy Taken  REPEAT EXAM: Return in 3 year(s) for Colonoscopy.    CPT CODES: 60454 Colonoscopy, flexible, proximal to splenic flexure; with  ablation of tumor(s), polyp(s), or other lesion(s) not amenable to removal by  hot biopsy forceps, bipolar cautery or snare technique  ICD9 CODES: 1. V10.06 Personal history of malignant neoplasm of rectum,  rectosigmoid junction and anus  2. 569.41 Ulcer of rectum and anus  3. 211.3 Benign neoplasm of colon  4.  V45.3 Postsurgical intestinal bypass or anastomosis status    __________________________________  Lenise Arena M.D.  eSigned: Lenise Arena M.D. 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  Diagnostic Studies History Adin Hector, MD; 09/13/2019 12:16 PM) Colonoscopy 1-5 years ago Mammogram 1-3 years ago  Allergies Adin Hector, MD; 09/13/2019 12:16 PM) Sulfa Antibiotics Allergies Reconciled  Medication History Andreas Blower, [place]; 09/13/2019 11:26 AM) No Current Medications Medications Reconciled  Social History Adin Hector, MD; 09/13/2019 12:16 PM) Alcohol use Occasional alcohol use. Caffeine use Coffee. Illicit drug use Remotely quit drug use. Tobacco use Former smoker.  Family History Adin Hector, MD; 09/13/2019 12:16 PM) Cancer Brother, Father. Colon Cancer Mother. Diabetes Mellitus Brother, Father. Hypertension Mother. Thyroid problems Mother.  Pregnancy / Birth History Adin Hector, MD; 09/13/2019 12:16 PM) Age at menarche 62 years. Age of menopause 83-55 Gravida 0 Para 0  Other Problems Adin Hector, MD; 09/13/2019 12:16 PM) Colon Cancer Ulcerative Colitis    Vitals (Armen Ferguson CMA; 09/13/2019 11:26 AM) 09/13/2019 11:25 AM Weight: 153.5 lb Height: 62in Body Surface Area: 1.71 m Body Mass Index: 28.08 kg/m  Temp.: 98.29F  Pulse: 90 (Regular)  P.OX: 99% (Room air) BP: 136/84 (Sitting,  Left Arm, Standard)        Physical Exam Adin Hector MD; 09/13/2019 12:25 PM)  General Mental Status-Alert. General Appearance-Not in acute distress. Voice-Normal. Note: Relaxed. Nontoxic.  Integumentary Global Assessment Upon inspection and palpation of skin surfaces of the - Distribution of scalp and body hair is normal. General Characteristics Overall examination of the patient's skin reveals - no rashes and no suspicious lesions.  Head and Neck Head-normocephalic, atraumatic with no lesions or palpable masses. Face Global Assessment - atraumatic, no absence of expression. Neck Global Assessment - no abnormal movements, no decreased range of motion. Trachea-midline. Thyroid Gland Characteristics - non-tender.  Eye Eyeball - Left-Extraocular movements intact, No Nystagmus - Left. Eyeball - Right-Extraocular movements intact, No Nystagmus - Right. Upper Eyelid - Left-No Cyanotic - Left. Upper Eyelid - Right-No Cyanotic - Right.  Chest and Lung Exam Inspection Accessory muscles - No use of accessory muscles in breathing.  Abdomen Note: Large left lower quadrant parastomal hernia. Sensitive reducible. Some mucosal prolapse of her end ileostomy. He will keep his seal. Midline incision and right lower quadrant incisions closed without hernias No active bleeding. No cellulitis. No guarding/rebound tenderness  Female Genitourinary Note: Introitus somewhat narrowed but no evidence of any recurrent rectovaginal fistula. Flat scar on posterior vaginal wall/introitus. No methylene blue staining after retention enema in introitus or rectovaginal septum. No drainage or abnormality. No mucus. No exposed staples. No evidence of fistula  Rectal Note: Perianal skin clear. Normal sphincter tone.  Anal skin tag. Tolerance digital rectal exam to 3 cm mild coloanal stricturing but allows index finger more proximally. Some thinness on the mid  rectovaginal septum but no fistula felt  Peripheral Vascular Upper Extremity Inspection - Left - Not Gangrenous, No Petechiae. Inspection - Right - Not Gangrenous, No Petechiae.  Neurologic Neurologic evaluation reveals -normal attention span and ability to concentrate, able to name objects and repeat phrases. Appropriate fund of knowledge and normal coordination.  Neuropsychiatric Mental status exam performed with findings of-able to articulate well with normal speech/language, rate, volume and coherence and no evidence of hallucinations, delusions, obsessions or homicidal/suicidal ideation. Orientation-oriented X3.  Musculoskeletal Global Assessment Gait and Station - normal gait and station.  Lymphatic General Lymphatics Description - No Generalized lymphadenopathy.    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 Recent episode  of incarceration at old incisional hernia. I think she needs something definitively done to prevent incarceration/strangulation there.  Ideally take down ileostomy. Ileum to ascending colon. Robotic lysis of adhesions. Intracorporeal anastomosis of end ileum to RLQ ascending colon. Increased risk of need to convert to open or hand-assisted. Ideally try to avoid any new incisions given her history of recurrent hernias  Primary repair of the hernia. Then can plan ventral hernia repairs with mesh on any potential recurrent hernias.  While she's had evidence of rectovaginal fistula in past, there is no evidence in the past 6 years. Nothing obvious on EUA 2014, physical exam 2020-2021. Nothing on MRI. No evidence with methylene blue enema/vaginal packing. She has tolerated self Hagar anorectal dilatations as well as pelvic floor physical therapy. She is tolerating retention enemas. She had abnormal defecation but ultimately was able to  evacuate contrast gel on MRI defecography.  She has had no evidence of local recurrence 10 years out from her initial operation. She has had normal colonoscopies in 2015 & 2018, arguing against any major stricturing at the coloanal anastomosis.. She has normal sphincter tone. She wants any surgery by me. I discussed with my colorectal partner, Dr. Dema Severin again. Would have colonoscopy available intraoperatively  Should she have intolerance with ostomy takedown, convert to a colostomy through left upper quadrant site to minimize recurrence and diarrhea issues.   PREOP COLON - ENCOUNTER FOR PREOPERATIVE EXAMINATION FOR GENERAL SURGICAL PROCEDURE (Z01.818)  Current Plans You are being scheduled for surgery- Our schedulers will call you.  You should hear from our office's scheduling department within 5 working days about the location, date, and time of surgery. We try to make accommodations for patient's preferences in scheduling surgery, but sometimes the OR schedule or the surgeon's schedule prevents [place] from making those accommodations.  If you have not heard from our office [phone]) in 5 working days, call the office and ask for your surgeon's nurse.  If you have other questions about your diagnosis, plan, or surgery, call the office and ask for your surgeon's nurse.  The anatomy & physiology of the digestive tract was discussed. The pathophysiology was discussed. Possibility of remaining with an ostomy permanently was discussed. I offered ostomy takedown. Laparoscopic & open techniques were discussed.  Risks such as bleeding, infection, abscess, leak, reoperation, possible re-ostomy, injury to other organs, hernia, heart attack, death, and other risks were discussed. I noted a good likelihood this will help address the problem. Goals of post-operative recovery were discussed as well. We will work to minimize complications. Questions were answered. The patient expresses  understanding & wishes to proceed with surgery.  Written instructions provided Pt Education - Pamphlet Given - Laparoscopic Colorectal Surgery: discussed with patient and provided information. Pt Education - CCS Colectomy post-op instructions: discussed with patient and provided information.  ANAL STRICTURE (K62.4) Impression: Improved with pelvic floor physical therapy and self dilatations. No evidence of recurrent fistula.  If + on intraop EUA/coloonscopy, then switch to end colostomy to LUQ (hopefully unlikely)  Current Plans Pt Education - CCS Pelvic Floor Exercises (Kegels) and Dysfunction HCI (Arnetra Terris) Referred to Physical Therapy, for evaluation and follow up (Physical Therapy).  INCISIONAL HERNIA, INCARCERATED (K43.0) Impression: Recovering status post primary repair of incarcerated hernia causing bowel obstruction. She is a moderate risk of hernia recurrence. If that happens, would recommend mesh repair. We will see.   RECTAL ADENOCARCINOMA (C20) Story: DIAGNOSIS: Initial stage I rectal adenocarcinoma (T2N0M0) Biopsy proven lung metastasis, s/p wedge resection  TREATMENT:  1. Underwent LAR in Nov 2010 with temp loop ileostomy 2. Ileostomy reversal Jan 2011 3. 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 4. Found to have a new lung nodule on CT imaging on 03/10/13 which was biopsy proven recurrent disease on 04/18/13 5. Underwent thoracoscopic wedge resection of right upper lobe on 05/30/13 6. Begin pseudo-adjuvant chemotherapy with single agent Xeloda (~1000mg /m^2 BID) 07/05/13. Complicated with grade 3 hand foot syndrome on 08/19/2013 3rd day of cycle 3. Dose reduced 50% when resumed 09/22/2013. Completed 4 cycles (3 months of treatment) on 10/20/2013.  Adin Hector, MD, FACS, MASCRS Gastrointestinal and Minimally Invasive Surgery  [facility] [address], [place], [place] [address] [phone] Main /  Paging [phone] Fax

## 2019-10-03 ENCOUNTER — Ambulatory Visit
Admission: RE | Admit: 2019-10-03 | Discharge: 2019-10-03 | Disposition: A | Discharge status: Home / Self Care | Payer: BC Managed Care – PPO | Source: Ambulatory Visit

## 2019-10-03 ENCOUNTER — Other Ambulatory Visit: Payer: Self-pay

## 2019-10-03 ENCOUNTER — Other Ambulatory Visit: Payer: Self-pay | Admitting: Family Medicine

## 2019-10-03 DIAGNOSIS — Z1231 Encounter for screening mammogram for malignant neoplasm of breast: Secondary | ICD-10-CM

## 2019-10-20 DIAGNOSIS — Z932 Ileostomy status: Secondary | ICD-10-CM | POA: Diagnosis not present

## 2019-10-29 ENCOUNTER — Other Ambulatory Visit: Payer: Self-pay

## 2019-10-29 ENCOUNTER — Emergency Department (HOSPITAL_COMMUNITY): Payer: BC Managed Care – PPO

## 2019-10-29 ENCOUNTER — Emergency Department (HOSPITAL_COMMUNITY)
Admission: EM | Admit: 2019-10-29 | Discharge: 2019-10-29 | Disposition: A | Discharge status: Home / Self Care | Payer: BC Managed Care – PPO | Attending: Emergency Medicine | Admitting: Emergency Medicine

## 2019-10-29 DIAGNOSIS — K565 Intestinal adhesions [bands], unspecified as to partial versus complete obstruction: Secondary | ICD-10-CM | POA: Diagnosis not present

## 2019-10-29 DIAGNOSIS — R1031 Right lower quadrant pain: Secondary | ICD-10-CM | POA: Diagnosis not present

## 2019-10-29 DIAGNOSIS — R112 Nausea with vomiting, unspecified: Secondary | ICD-10-CM | POA: Diagnosis not present

## 2019-10-29 DIAGNOSIS — Z87891 Personal history of nicotine dependence: Secondary | ICD-10-CM | POA: Insufficient documentation

## 2019-10-29 DIAGNOSIS — R1110 Vomiting, unspecified: Secondary | ICD-10-CM | POA: Diagnosis not present

## 2019-10-29 DIAGNOSIS — K5651 Intestinal adhesions [bands], with partial obstruction: Secondary | ICD-10-CM | POA: Insufficient documentation

## 2019-10-29 LAB — LIPASE, BLOOD: Lipase: 23 U/L (ref 11–51)

## 2019-10-29 LAB — COMPREHENSIVE METABOLIC PANEL
ALT: 17 U/L (ref 0–44)
AST: 17 U/L (ref 15–41)
Albumin: 4.3 g/dL (ref 3.5–5.0)
Alkaline Phosphatase: 81 U/L (ref 38–126)
Anion gap: 8 (ref 5–15)
BUN: 15 mg/dL (ref 8–23)
CO2: 25 mmol/L (ref 22–32)
Calcium: 9.5 mg/dL (ref 8.9–10.3)
Chloride: 101 mmol/L (ref 98–111)
Creatinine, Ser: 0.56 mg/dL (ref 0.44–1.00)
GFR calc Af Amer: >60 mL/min (ref >60)
GFR calc non Af Amer: >60 mL/min (ref >60)
Glucose, Bld: 131 mg/dL — ABNORMAL HIGH (ref 70–99)
Potassium: 4.2 mmol/L (ref 3.5–5.1)
Sodium: 134 mmol/L — ABNORMAL LOW (ref 135–145)
Total Bilirubin: 0.9 mg/dL (ref 0.3–1.2)
Total Protein: 8 g/dL (ref 6.5–8.1)

## 2019-10-29 LAB — CBC WITH DIFFERENTIAL/PLATELET
Abs Immature Granulocytes: 0.04 10*3/uL (ref 0.00–0.07)
Basophils Absolute: 0 10*3/uL (ref 0.0–0.1)
Basophils Relative: 0 %
Eosinophils Absolute: 0 10*3/uL (ref 0.0–0.5)
Eosinophils Relative: 0 %
HCT: 44.8 % (ref 36.0–46.0)
Hemoglobin: 14.5 g/dL (ref 12.0–15.0)
Immature Granulocytes: 0 %
Lymphocytes Relative: 5 %
Lymphs Abs: 0.7 10*3/uL (ref 0.7–4.0)
MCH: 31.3 pg (ref 26.0–34.0)
MCHC: 32.4 g/dL (ref 30.0–36.0)
MCV: 96.6 fL (ref 80.0–100.0)
Monocytes Absolute: 0.5 10*3/uL (ref 0.1–1.0)
Monocytes Relative: 3 %
Neutro Abs: 14 10*3/uL — ABNORMAL HIGH (ref 1.7–7.7)
Neutrophils Relative %: 92 %
Platelets: 265 10*3/uL (ref 150–400)
RBC: 4.64 MIL/uL (ref 3.87–5.11)
RDW: 13 % (ref 11.5–15.5)
WBC: 15.3 10*3/uL — ABNORMAL HIGH (ref 4.0–10.5)
nRBC: 0 % (ref 0.0–0.2)

## 2019-10-29 LAB — URINALYSIS, ROUTINE W REFLEX MICROSCOPIC
Bilirubin Urine: NEGATIVE
Glucose, UA: NEGATIVE mg/dL
Hgb urine dipstick: NEGATIVE
Ketones, ur: NEGATIVE mg/dL
Leukocytes,Ua: NEGATIVE
Nitrite: NEGATIVE
Protein, ur: NEGATIVE mg/dL
Specific Gravity, Urine: 1.02 (ref 1.005–1.030)
pH: 6 (ref 5.0–8.0)

## 2019-10-29 MED ORDER — SODIUM CHLORIDE 0.9 % IV BOLUS
500.0000 mL | Freq: Once | INTRAVENOUS | Status: AC
  Administered 2019-10-29: 500 mL via INTRAVENOUS

## 2019-10-29 MED ORDER — MORPHINE SULFATE (PF) 4 MG/ML IV SOLN
4.0000 mg | Freq: Once | INTRAVENOUS | Status: AC
  Administered 2019-10-29: 4 mg via INTRAVENOUS
  Filled 2019-10-29: qty 1

## 2019-10-29 MED ORDER — IOHEXOL 300 MG/ML  SOLN
100.0000 mL | Freq: Once | INTRAMUSCULAR | Status: AC | PRN
  Administered 2019-10-29: 100 mL via INTRAVENOUS

## 2019-10-29 MED ORDER — SODIUM CHLORIDE (PF) 0.9 % IJ SOLN
INTRAMUSCULAR | Status: AC
  Filled 2019-10-29: qty 50

## 2019-10-29 MED ORDER — ONDANSETRON 4 MG PO TBDP: 4.0000 mg | ORAL_TABLET | Freq: Three times a day (TID) | ORAL | 0 refills | Status: DC | PRN

## 2019-10-29 MED ORDER — ONDANSETRON HCL 4 MG/2ML IJ SOLN
4.0000 mg | Freq: Once | INTRAMUSCULAR | Status: AC
  Administered 2019-10-29: 4 mg via INTRAVENOUS
  Filled 2019-10-29: qty 2

## 2019-10-29 NOTE — Discharge Instructions (Addendum)
As discussed, you have a partial bowel obstruction. Continue bowel rest for the next few days by eating/drinking only liquids. Call your surgeon on Monday to discuss further evaluation and diet. Return to the ER if you develop worsening pain, inability to pass any stool, or worsening nausea/vomiting. I am sending you home with nausea medication. Take as prescribed. Return to the ER for new or worsening symptoms.

## 2019-10-29 NOTE — ED Notes (Signed)
PT ambulated to and from restroom without assistance.
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
## 2019-10-29 NOTE — ED Triage Notes (Signed)
Patient reports severe abdominal pain starting around 1800 on 10/28/19. Patient says she has history of abdominal issues. Started vomiting at 1 am.

## 2019-11-14 NOTE — Patient Instructions (Signed)
DUE TO COVID-19 ONLY ONE VISITOR IS ALLOWED TO COME WITH YOU AND STAY IN THE WAITING ROOM ONLY DURING PRE OP AND PROCEDURE DAY OF SURGERY. THE 1 VISITOR MAY VISIT WITH YOU AFTER SURGERY IN YOUR PRIVATE ROOM DURING VISITING HOURS ONLY!  YOU NEED TO HAVE A COVID 19 TEST ON_2/24______ @_11 :55______, THIS TEST MUST BE DONE BEFORE SURGERY, COME  [place] , [address].  ([place]) ONCE YOUR COVID TEST IS COMPLETED, PLEASE BEGIN THE QUARANTINE INSTRUCTIONS AS OUTLINED IN YOUR HANDOUT.                Afrin Stastny    Your procedure is scheduled on: 11/23/19   Report to [facility] Main  Entrance   Report to Short stay 5:30 AM     Call this number if you have problems the morning of surgery [phone]   Follow instructions for bowel  Prep from Dr. Clyda Greener office if given any. Drink plenty of fluids the day of prep.    Drink 2 ensure drinks at 10:00 PM.  No food after midnight.  Drink the 3rd ensure drink at 4:30 AM then nothing more by mouth   BRUSH YOUR TEETH MORNING OF SURGERY AND RINSE YOUR MOUTH OUT, NO CHEWING GUM CANDY OR MINTS.     Take these medicines the morning of surgery with A SIP OF WATER: None                                 You may not have any metal on your body including hair pins and              piercings  Do not wear jewelry, make-up, lotions, powders or perfumes, deodorant             Do not wear nail polish on your fingernails.  Do not shave  48 hours prior to surgery.            Do not bring valuables to the hospital. [place].  Contacts, dentures or bridgework may not be worn into surgery.      Special Instructions: N/A              Please read over the following fact sheets you were given: _____________________________________________________________________             [facility] - Preparing for Surgery Before surgery, you can play an  important role .  Because skin is not sterile, your skin needs to be as free of germs as possible.   You can reduce the number of germs on your skin by washing with CHG (chlorahexidine gluconate) soap before surgery.   CHG is an antiseptic cleaner which kills germs and bonds with the skin to continue killing germs even after washing. Please DO NOT use if you have an allergy to CHG or antibacterial soaps .  If your skin becomes reddened/irritated stop using the CHG and inform your nurse when you arrive at Short Stay. Do not shave (including legs and underarms) for at least 48 hours prior to the first CHG shower.    Please follow these instructions carefully:  1.  Shower with CHG Soap the night before surgery and the  morning of Surgery.  2.  If you choose to wash your hair, wash  your hair first as usual with your  normal  shampoo.  3.  After you shampoo, rinse your hair and body thoroughly to remove the  shampoo.                                        4.  Use CHG as you would any other liquid soap.  You can apply chg directly  to the skin and wash                       Gently with a scrungie or clean washcloth.  5.  Apply the CHG Soap to your body ONLY FROM THE NECK DOWN.   Do not use on face/ open                           Wound or open sores. Avoid contact with eyes, ears mouth and genitals (private parts).                       Wash face,  Genitals (private parts) with your normal soap.             6.  Wash thoroughly, paying special attention to the area where your surgery  will be performed.  7.  Thoroughly rinse your body with warm water from the neck down.  8.  DO NOT shower/wash with your normal soap after using and rinsing off  the CHG Soap.             9.  Pat yourself dry with a clean towel.            10.  Wear clean pajamas.            11.  Place clean sheets on your bed the night of your first shower and do not  sleep with pets. Day of Surgery : Do not apply any  lotions/deodorants the morning of surgery.  Please wear clean clothes to the hospital/surgery center.  FAILURE TO FOLLOW THESE INSTRUCTIONS MAY RESULT IN THE CANCELLATION OF YOUR SURGERY PATIENT SIGNATURE_________________________________  NURSE SIGNATURE__________________________________  ________________________________________________________________________   Adam Phenix  An incentive spirometer is a tool that can help keep your lungs clear and active. This tool measures how well you are filling your lungs with each breath. Taking long deep breaths may help reverse or decrease the chance of developing breathing (pulmonary) problems (especially infection) following:  A long period of time when you are unable to move or be active. BEFORE THE PROCEDURE   If the spirometer includes an indicator to show your best effort, your nurse or respiratory therapist will set it to a desired goal.  If possible, sit up straight or lean slightly forward. Try not to slouch.  Hold the incentive spirometer in an upright position. INSTRUCTIONS FOR USE  1. Sit on the edge of your bed if possible, or sit up as far as you can in bed or on a chair. 2. Hold the incentive spirometer in an upright position. 3. Breathe out normally. 4. Place the mouthpiece in your mouth and seal your lips tightly around it. 5. Breathe in slowly and as deeply as possible, raising the piston or the ball toward the top of the column. 6. Hold your breath for 3-5 seconds or for as long as possible. Allow the piston or  ball to fall to the bottom of the column. 7. Remove the mouthpiece from your mouth and breathe out normally. 8. Rest for a few seconds and repeat Steps 1 through 7 at least 10 times every 1-2 hours when you are awake. Take your time and take a few normal breaths between deep breaths. 9. The spirometer may include an indicator to show your best effort. Use the indicator as a goal to work toward during each  repetition. 10. After each set of 10 deep breaths, practice coughing to be sure your lungs are clear. If you have an incision (the cut made at the time of surgery), support your incision when coughing by placing a pillow or rolled up towels firmly against it. Once you are able to get out of bed, walk around indoors and cough well. You may stop using the incentive spirometer when instructed by your caregiver.  RISKS AND COMPLICATIONS  Take your time so you do not get dizzy or light-headed.  If you are in pain, you may need to take or ask for pain medication before doing incentive spirometry. It is harder to take a deep breath if you are having pain. AFTER USE  Rest and breathe slowly and easily.  It can be helpful to keep track of a log of your progress. Your caregiver can provide you with a simple table to help with this. If you are using the spirometer at home, follow these instructions: [place] IF:   You are having difficultly using the spirometer.  You have trouble using the spirometer as often as instructed.  Your pain medication is not giving enough relief while using the spirometer.  You develop fever of 100.5 F (38.1 C) or higher. SEEK IMMEDIATE MEDICAL CARE IF:   You cough up bloody sputum that had not been present before.  You develop fever of 102 F (38.9 C) or greater.  You develop worsening pain at or near the incision site. MAKE SURE YOU:   Understand these instructions.  Will watch your condition.  Will get help right away if you are not doing well or get worse. Document Released: 11/24/2006 Document Revised: 10/06/2011 Document Reviewed: 01/25/2007 [facility] Patient Information 2014 [place], [place].   ________________________________________________________________________

## 2019-11-15 ENCOUNTER — Encounter (HOSPITAL_COMMUNITY): Payer: Self-pay

## 2019-11-15 ENCOUNTER — Encounter (HOSPITAL_COMMUNITY)
Admission: RE | Admit: 2019-11-15 | Discharge: 2019-11-15 | Disposition: A | Discharge status: Home / Self Care | Payer: BC Managed Care – PPO | Source: Ambulatory Visit | Attending: Surgery | Admitting: Surgery

## 2019-11-15 ENCOUNTER — Other Ambulatory Visit: Payer: Self-pay

## 2019-11-15 DIAGNOSIS — Z01812 Encounter for preprocedural laboratory examination: Secondary | ICD-10-CM | POA: Diagnosis not present

## 2019-11-15 LAB — BASIC METABOLIC PANEL
Anion gap: 7 (ref 5–15)
BUN: 16 mg/dL (ref 8–23)
CO2: 22 mmol/L (ref 22–32)
Calcium: 9.3 mg/dL (ref 8.9–10.3)
Chloride: 114 mmol/L — ABNORMAL HIGH (ref 98–111)
Creatinine, Ser: 0.67 mg/dL (ref 0.44–1.00)
GFR calc Af Amer: >60 mL/min (ref >60)
GFR calc non Af Amer: >60 mL/min (ref >60)
Glucose, Bld: 82 mg/dL (ref 70–99)
Potassium: 4.7 mmol/L (ref 3.5–5.1)
Sodium: 143 mmol/L (ref 135–145)

## 2019-11-15 LAB — CBC
HCT: 43 % (ref 36.0–46.0)
Hemoglobin: 13.8 g/dL (ref 12.0–15.0)
MCH: 31.7 pg (ref 26.0–34.0)
MCHC: 32.1 g/dL (ref 30.0–36.0)
MCV: 98.9 fL (ref 80.0–100.0)
Platelets: 273 10*3/uL (ref 150–400)
RBC: 4.35 MIL/uL (ref 3.87–5.11)
RDW: 12.9 % (ref 11.5–15.5)
WBC: 6.1 10*3/uL (ref 4.0–10.5)
nRBC: 0 % (ref 0.0–0.2)

## 2019-11-15 LAB — HEMOGLOBIN A1C
Hgb A1c MFr Bld: 5.7 % — ABNORMAL HIGH (ref 4.8–5.6)
Mean Plasma Glucose: 116.89 mg/dL

## 2019-11-15 NOTE — Progress Notes (Signed)
PCP - Dr. Cipriano Mile Cardiologist - no  Chest x-ray - 02/04/20 EKG - no Stress Test - no ECHO - no Cardiac Cath - no  Sleep Study - no CPAP -   Fasting Blood Sugar - NA Checks Blood Sugar _____ times a day  Blood Thinner Instructions:NA Aspirin Instructions: Last Dose:  Anesthesia review:   Patient denies shortness of breath, fever, cough and chest pain at PAT appointment  yes Patient verbalized understanding of instructions that were given to them at the PAT appointment. Patient was also instructed that they will need to review over the PAT instructions again at home before surgery. yes

## 2019-11-15 NOTE — Consult Note (Signed)
[place] Nurse requested for preoperative stoma site marking  Discussed surgical procedure and stoma creation with patient, she has current stoma and had another ileostomy previously.   Examined patient sitting and standing in order to place the marking in the patient's visual field, she has a very large hernia that limits the availably of abdominal opportunities. She is aware the new stoma if needed will need to be higher in hopes when hernia is reduced it will not be too low to manage independently.  Marked for colostomy in the LLQ  _4cm ___ cm to the left of the umbilicus and [id number] above/below the umbilicus.  Patient's abdomen cleansed with CHG wipes at site markings, allowed to air dry prior to marking.Covered mark with thin film transparent dressing to preserve mark until date of surgery.   [place] Nurse team will follow up with patient after surgery for continue ostomy care and teaching.  [place] MSN, [place], [place], [place]

## 2019-11-18 ENCOUNTER — Encounter (INDEPENDENT_AMBULATORY_CARE_PROVIDER_SITE_OTHER): Payer: BLUE CROSS/BLUE SHIELD | Admitting: Ophthalmology

## 2019-11-19 ENCOUNTER — Other Ambulatory Visit (HOSPITAL_COMMUNITY)
Admission: RE | Admit: 2019-11-19 | Discharge: 2019-11-19 | Disposition: A | Discharge status: Home / Self Care | Payer: BC Managed Care – PPO | Source: Ambulatory Visit | Attending: Surgery | Admitting: Surgery

## 2019-11-19 DIAGNOSIS — Z01812 Encounter for preprocedural laboratory examination: Secondary | ICD-10-CM | POA: Insufficient documentation

## 2019-11-19 DIAGNOSIS — Z20822 Contact with and (suspected) exposure to covid-19: Secondary | ICD-10-CM | POA: Diagnosis not present

## 2019-11-20 LAB — SARS CORONAVIRUS 2 (TAT 6-24 HRS): SARS Coronavirus 2: NEGATIVE

## 2019-11-22 MED ORDER — BUPIVACAINE LIPOSOME 1.3 % IJ SUSP
20.0000 mL | INTRAMUSCULAR | Status: DC
  Filled 2019-11-22: qty 20

## 2019-11-23 ENCOUNTER — Inpatient Hospital Stay (HOSPITAL_COMMUNITY): Payer: BC Managed Care – PPO

## 2019-11-23 ENCOUNTER — Encounter (HOSPITAL_COMMUNITY)
Admission: RE | Disposition: A | Discharge status: Home Health | Payer: Self-pay | Source: Home / Self Care | Attending: Surgery

## 2019-11-23 ENCOUNTER — Inpatient Hospital Stay (HOSPITAL_COMMUNITY)
Admission: RE | Admit: 2019-11-23 | Discharge: 2019-12-05 | DRG: 336 | Disposition: A | Discharge status: Home Health | Payer: BC Managed Care – PPO | Attending: Surgery | Admitting: Surgery

## 2019-11-23 ENCOUNTER — Other Ambulatory Visit: Payer: Self-pay

## 2019-11-23 ENCOUNTER — Encounter (HOSPITAL_COMMUNITY): Payer: Self-pay | Admitting: Surgery

## 2019-11-23 DIAGNOSIS — C7800 Secondary malignant neoplasm of unspecified lung: Secondary | ICD-10-CM | POA: Diagnosis not present

## 2019-11-23 DIAGNOSIS — N823 Fistula of vagina to large intestine: Secondary | ICD-10-CM | POA: Diagnosis present

## 2019-11-23 DIAGNOSIS — Z6830 Body mass index (BMI) 30.0-30.9, adult: Secondary | ICD-10-CM

## 2019-11-23 DIAGNOSIS — Z20822 Contact with and (suspected) exposure to covid-19: Secondary | ICD-10-CM | POA: Diagnosis not present

## 2019-11-23 DIAGNOSIS — R000 Tachycardia, unspecified: Secondary | ICD-10-CM | POA: Diagnosis not present

## 2019-11-23 DIAGNOSIS — K6389 Other specified diseases of intestine: Secondary | ICD-10-CM | POA: Diagnosis present

## 2019-11-23 DIAGNOSIS — Z800 Family history of malignant neoplasm of digestive organs: Secondary | ICD-10-CM

## 2019-11-23 DIAGNOSIS — R109 Unspecified abdominal pain: Secondary | ICD-10-CM | POA: Diagnosis not present

## 2019-11-23 DIAGNOSIS — K432 Incisional hernia without obstruction or gangrene: Secondary | ICD-10-CM | POA: Diagnosis not present

## 2019-11-23 DIAGNOSIS — Z87442 Personal history of urinary calculi: Secondary | ICD-10-CM | POA: Diagnosis present

## 2019-11-23 DIAGNOSIS — K56609 Unspecified intestinal obstruction, unspecified as to partial versus complete obstruction: Secondary | ICD-10-CM | POA: Diagnosis not present

## 2019-11-23 DIAGNOSIS — D638 Anemia in other chronic diseases classified elsewhere: Secondary | ICD-10-CM | POA: Diagnosis present

## 2019-11-23 DIAGNOSIS — Z87891 Personal history of nicotine dependence: Secondary | ICD-10-CM | POA: Diagnosis not present

## 2019-11-23 DIAGNOSIS — M8580 Other specified disorders of bone density and structure, unspecified site: Secondary | ICD-10-CM | POA: Diagnosis not present

## 2019-11-23 DIAGNOSIS — K660 Peritoneal adhesions (postprocedural) (postinfection): Secondary | ICD-10-CM | POA: Diagnosis present

## 2019-11-23 DIAGNOSIS — C20 Malignant neoplasm of rectum: Secondary | ICD-10-CM | POA: Diagnosis not present

## 2019-11-23 DIAGNOSIS — E669 Obesity, unspecified: Secondary | ICD-10-CM | POA: Diagnosis not present

## 2019-11-23 DIAGNOSIS — K589 Irritable bowel syndrome without diarrhea: Secondary | ICD-10-CM | POA: Diagnosis not present

## 2019-11-23 DIAGNOSIS — K567 Ileus, unspecified: Secondary | ICD-10-CM | POA: Diagnosis not present

## 2019-11-23 DIAGNOSIS — Z85048 Personal history of other malignant neoplasm of rectum, rectosigmoid junction, and anus: Secondary | ICD-10-CM | POA: Diagnosis present

## 2019-11-23 DIAGNOSIS — K435 Parastomal hernia without obstruction or  gangrene: Principal | ICD-10-CM | POA: Diagnosis present

## 2019-11-23 DIAGNOSIS — R4589 Other symptoms and signs involving emotional state: Secondary | ICD-10-CM

## 2019-11-23 DIAGNOSIS — K9189 Other postprocedural complications and disorders of digestive system: Secondary | ICD-10-CM | POA: Diagnosis not present

## 2019-11-23 DIAGNOSIS — Z432 Encounter for attention to ileostomy: Secondary | ICD-10-CM | POA: Diagnosis not present

## 2019-11-23 DIAGNOSIS — K439 Ventral hernia without obstruction or gangrene: Secondary | ICD-10-CM | POA: Diagnosis not present

## 2019-11-23 DIAGNOSIS — Z932 Ileostomy status: Secondary | ICD-10-CM | POA: Diagnosis not present

## 2019-11-23 HISTORY — PX: XI ROBOTIC ASSISTED COLOSTOMY TAKEDOWN: SHX6828

## 2019-11-23 HISTORY — PX: PARASTOMAL HERNIA REPAIR: SHX2162

## 2019-11-23 HISTORY — PX: LYSIS OF ADHESION: SHX5961

## 2019-11-23 HISTORY — PX: COLONOSCOPY: SHX5424

## 2019-11-23 SURGERY — CLOSURE, COLOSTOMY, ROBOT-ASSISTED
Anesthesia: General | Site: Rectum

## 2019-11-23 MED ORDER — ROCURONIUM BROMIDE 10 MG/ML (PF) SYRINGE
PREFILLED_SYRINGE | INTRAVENOUS | Status: AC
  Filled 2019-11-23: qty 10

## 2019-11-23 MED ORDER — SODIUM CHLORIDE 0.9 % IV SOLN
2.0000 g | Freq: Two times a day (BID) | INTRAVENOUS | Status: AC
  Administered 2019-11-24: 01:00:00 2 g via INTRAVENOUS
  Filled 2019-11-23: qty 2

## 2019-11-23 MED ORDER — ALVIMOPAN 12 MG PO CAPS
12.0000 mg | ORAL_CAPSULE | ORAL | Status: AC
  Administered 2019-11-23: 07:00:00 12 mg via ORAL
  Filled 2019-11-23: qty 1

## 2019-11-23 MED ORDER — SUGAMMADEX SODIUM 200 MG/2ML IV SOLN
INTRAVENOUS | Status: DC | PRN
  Administered 2019-11-23: 200 mg via INTRAVENOUS

## 2019-11-23 MED ORDER — PROCHLORPERAZINE MALEATE 10 MG PO TABS
10.0000 mg | ORAL_TABLET | Freq: Four times a day (QID) | ORAL | Status: DC | PRN
  Filled 2019-11-23: qty 1

## 2019-11-23 MED ORDER — HYDROMORPHONE HCL 1 MG/ML IJ SOLN
0.2500 mg | INTRAMUSCULAR | Status: DC | PRN
  Administered 2019-11-23 (×2): 0.25 mg via INTRAVENOUS

## 2019-11-23 MED ORDER — ALVIMOPAN 12 MG PO CAPS
12.0000 mg | ORAL_CAPSULE | Freq: Two times a day (BID) | ORAL | Status: DC
  Administered 2019-11-24 – 2019-11-27 (×6): 12 mg via ORAL
  Filled 2019-11-23 (×6): qty 1

## 2019-11-23 MED ORDER — ONDANSETRON HCL 4 MG/2ML IJ SOLN
4.0000 mg | Freq: Four times a day (QID) | INTRAMUSCULAR | Status: DC | PRN
  Administered 2019-11-24 – 2019-11-30 (×3): 4 mg via INTRAVENOUS
  Filled 2019-11-23 (×4): qty 2

## 2019-11-23 MED ORDER — GABAPENTIN 300 MG PO CAPS
300.0000 mg | ORAL_CAPSULE | ORAL | Status: AC
  Administered 2019-11-23: 07:00:00 300 mg via ORAL
  Filled 2019-11-23: qty 1

## 2019-11-23 MED ORDER — BUPIVACAINE HCL (PF) 0.25 % IJ SOLN
INTRAMUSCULAR | Status: DC | PRN
  Administered 2019-11-23: 50 mL

## 2019-11-23 MED ORDER — LIDOCAINE 2% (20 MG/ML) 5 ML SYRINGE
INTRAMUSCULAR | Status: DC | PRN
  Administered 2019-11-23: 100 mg via INTRAVENOUS

## 2019-11-23 MED ORDER — EPHEDRINE SULFATE-NACL 50-0.9 MG/10ML-% IV SOSY
PREFILLED_SYRINGE | INTRAVENOUS | Status: DC | PRN
  Administered 2019-11-23: 10 mg via INTRAVENOUS

## 2019-11-23 MED ORDER — HYDROCORT-PRAMOXINE (PERIANAL) 2.5-1 % EX CREA
1.0000 "application " | TOPICAL_CREAM | Freq: Four times a day (QID) | CUTANEOUS | Status: DC | PRN
  Filled 2019-11-23: qty 30

## 2019-11-23 MED ORDER — MAGIC MOUTHWASH
15.0000 mL | Freq: Four times a day (QID) | ORAL | Status: DC | PRN
  Filled 2019-11-23: qty 15

## 2019-11-23 MED ORDER — TRAMADOL HCL 50 MG PO TABS
50.0000 mg | ORAL_TABLET | Freq: Four times a day (QID) | ORAL | Status: DC | PRN
  Administered 2019-11-27: 07:00:00 100 mg via ORAL
  Filled 2019-11-23: qty 1
  Filled 2019-11-23: qty 2

## 2019-11-23 MED ORDER — BUPIVACAINE HCL 0.25 % IJ SOLN
INTRAMUSCULAR | Status: AC
  Filled 2019-11-23: qty 1

## 2019-11-23 MED ORDER — ENALAPRILAT 1.25 MG/ML IV SOLN
0.6250 mg | Freq: Four times a day (QID) | INTRAVENOUS | Status: DC | PRN
  Filled 2019-11-23: qty 1

## 2019-11-23 MED ORDER — SODIUM CHLORIDE 0.9 % IV SOLN
2.0000 g | INTRAVENOUS | Status: DC
  Filled 2019-11-23: qty 2

## 2019-11-23 MED ORDER — STERILE WATER FOR IRRIGATION IR SOLN
Status: DC | PRN
  Administered 2019-11-23: 1000 mL

## 2019-11-23 MED ORDER — ONDANSETRON HCL 4 MG/2ML IJ SOLN
INTRAMUSCULAR | Status: AC
  Filled 2019-11-23: qty 2

## 2019-11-23 MED ORDER — PROPOFOL 10 MG/ML IV BOLUS
INTRAVENOUS | Status: DC | PRN
  Administered 2019-11-23: 100 mg via INTRAVENOUS

## 2019-11-23 MED ORDER — DEXAMETHASONE SODIUM PHOSPHATE 10 MG/ML IJ SOLN
INTRAMUSCULAR | Status: DC | PRN
  Administered 2019-11-23: 10 mg via INTRAVENOUS

## 2019-11-23 MED ORDER — PHENYLEPHRINE HCL (PRESSORS) 10 MG/ML IV SOLN
INTRAVENOUS | Status: AC
  Filled 2019-11-23: qty 1

## 2019-11-23 MED ORDER — ENOXAPARIN SODIUM 40 MG/0.4ML ~~LOC~~ SOLN
40.0000 mg | Freq: Once | SUBCUTANEOUS | Status: AC
  Administered 2019-11-23: 07:00:00 40 mg via SUBCUTANEOUS
  Filled 2019-11-23: qty 0.4

## 2019-11-23 MED ORDER — LACTATED RINGERS IV SOLN: INTRAVENOUS | Status: DC

## 2019-11-23 MED ORDER — ZINC OXIDE 40 % EX OINT
TOPICAL_OINTMENT | Freq: Two times a day (BID) | CUTANEOUS | Status: DC
  Administered 2019-11-24 – 2019-11-28 (×3): 1 via TOPICAL
  Filled 2019-11-23: qty 57

## 2019-11-23 MED ORDER — LIP MEDEX EX OINT
1.0000 "application " | TOPICAL_OINTMENT | Freq: Two times a day (BID) | CUTANEOUS | Status: DC
  Administered 2019-11-24 – 2019-12-05 (×22): 1 via TOPICAL
  Filled 2019-11-23 (×4): qty 7

## 2019-11-23 MED ORDER — PHENYLEPHRINE 40 MCG/ML (10ML) SYRINGE FOR IV PUSH (FOR BLOOD PRESSURE SUPPORT)
PREFILLED_SYRINGE | INTRAVENOUS | Status: DC | PRN
  Administered 2019-11-23 (×3): 80 ug via INTRAVENOUS

## 2019-11-23 MED ORDER — CHLORHEXIDINE GLUCONATE CLOTH 2 % EX PADS: 6.0000 | MEDICATED_PAD | Freq: Once | CUTANEOUS | Status: DC

## 2019-11-23 MED ORDER — ALUM & MAG HYDROXIDE-SIMETH 200-200-20 MG/5ML PO SUSP: 30.0000 mL | Freq: Four times a day (QID) | ORAL | Status: DC | PRN

## 2019-11-23 MED ORDER — LIDOCAINE IN D5W 4-5 MG/ML-% IV SOLN
INTRAVENOUS | Status: DC | PRN
  Administered 2019-11-23: .22 mg/min via INTRAVENOUS

## 2019-11-23 MED ORDER — PROPOFOL 10 MG/ML IV BOLUS
INTRAVENOUS | Status: AC
  Filled 2019-11-23: qty 20

## 2019-11-23 MED ORDER — DIPHENHYDRAMINE HCL 12.5 MG/5ML PO ELIX: 12.5000 mg | ORAL_SOLUTION | Freq: Four times a day (QID) | ORAL | Status: DC | PRN

## 2019-11-23 MED ORDER — LACTATED RINGERS IR SOLN
Status: DC | PRN
  Administered 2019-11-23: 1000 mL

## 2019-11-23 MED ORDER — KETAMINE HCL 10 MG/ML IJ SOLN
INTRAMUSCULAR | Status: AC
  Filled 2019-11-23: qty 1

## 2019-11-23 MED ORDER — CELECOXIB 200 MG PO CAPS
200.0000 mg | ORAL_CAPSULE | ORAL | Status: AC
  Administered 2019-11-23: 07:00:00 200 mg via ORAL
  Filled 2019-11-23: qty 1

## 2019-11-23 MED ORDER — HYDROMORPHONE HCL 1 MG/ML IJ SOLN
INTRAMUSCULAR | Status: AC
  Filled 2019-11-23: qty 1

## 2019-11-23 MED ORDER — GLYCOPYRROLATE 0.2 MG/ML IJ SOLN
INTRAMUSCULAR | Status: DC | PRN
  Administered 2019-11-23: .2 mg via INTRAVENOUS

## 2019-11-23 MED ORDER — LIDOCAINE 2% (20 MG/ML) 5 ML SYRINGE
INTRAMUSCULAR | Status: AC
  Filled 2019-11-23: qty 5

## 2019-11-23 MED ORDER — LOPERAMIDE HCL 2 MG PO CAPS: 2.0000 mg | ORAL_CAPSULE | Freq: Three times a day (TID) | ORAL | Status: DC | PRN

## 2019-11-23 MED ORDER — BUPIVACAINE LIPOSOME 1.3 % IJ SUSP
INTRAMUSCULAR | Status: DC | PRN
  Administered 2019-11-23: 20 mL

## 2019-11-23 MED ORDER — DEXAMETHASONE SODIUM PHOSPHATE 10 MG/ML IJ SOLN
INTRAMUSCULAR | Status: AC
  Filled 2019-11-23: qty 1

## 2019-11-23 MED ORDER — ROCURONIUM BROMIDE 10 MG/ML (PF) SYRINGE
PREFILLED_SYRINGE | INTRAVENOUS | Status: DC | PRN
  Administered 2019-11-23 (×6): 10 mg via INTRAVENOUS
  Administered 2019-11-23: 70 mg via INTRAVENOUS

## 2019-11-23 MED ORDER — SODIUM CHLORIDE 0.9 % IV SOLN
Freq: Three times a day (TID) | INTRAVENOUS | Status: DC | PRN
  Administered 2019-11-24: 250 mL/h via INTRAVENOUS

## 2019-11-23 MED ORDER — PROCHLORPERAZINE EDISYLATE 10 MG/2ML IJ SOLN: 5.0000 mg | Freq: Four times a day (QID) | INTRAMUSCULAR | Status: DC | PRN

## 2019-11-23 MED ORDER — DIPHENHYDRAMINE HCL 50 MG/ML IJ SOLN: 12.5000 mg | Freq: Four times a day (QID) | INTRAMUSCULAR | Status: DC | PRN

## 2019-11-23 MED ORDER — FENTANYL CITRATE (PF) 250 MCG/5ML IJ SOLN
INTRAMUSCULAR | Status: AC
  Filled 2019-11-23: qty 5

## 2019-11-23 MED ORDER — KETAMINE HCL 10 MG/ML IJ SOLN
INTRAMUSCULAR | Status: DC | PRN
Start: 2019-11-23 — End: 2019-11-23
  Administered 2019-11-23: 20 mg via INTRAVENOUS
  Administered 2019-11-23: 30 mg via INTRAVENOUS
  Administered 2019-11-23: 20 mg via INTRAVENOUS

## 2019-11-23 MED ORDER — ALBUMIN HUMAN 5 % IV SOLN: INTRAVENOUS | Status: DC | PRN

## 2019-11-23 MED ORDER — GABAPENTIN 100 MG PO CAPS
200.0000 mg | ORAL_CAPSULE | Freq: Three times a day (TID) | ORAL | Status: DC
  Administered 2019-11-23 – 2019-11-28 (×16): 200 mg via ORAL
  Filled 2019-11-23 (×16): qty 2

## 2019-11-23 MED ORDER — METOPROLOL TARTRATE 5 MG/5ML IV SOLN: 5.0000 mg | Freq: Four times a day (QID) | INTRAVENOUS | Status: DC | PRN

## 2019-11-23 MED ORDER — ENOXAPARIN SODIUM 40 MG/0.4ML ~~LOC~~ SOLN
40.0000 mg | SUBCUTANEOUS | Status: DC
  Administered 2019-11-24 – 2019-12-05 (×12): 40 mg via SUBCUTANEOUS
  Filled 2019-11-23 (×12): qty 0.4

## 2019-11-23 MED ORDER — LORAZEPAM 2 MG/ML IJ SOLN: 0.5000 mg | Freq: Three times a day (TID) | INTRAMUSCULAR | Status: DC | PRN

## 2019-11-23 MED ORDER — ONDANSETRON HCL 4 MG PO TABS
4.0000 mg | ORAL_TABLET | Freq: Four times a day (QID) | ORAL | Status: DC | PRN
  Administered 2019-11-28 – 2019-11-30 (×2): 4 mg via ORAL
  Filled 2019-11-23 (×2): qty 1

## 2019-11-23 MED ORDER — PHENYLEPHRINE HCL-NACL 10-0.9 MG/250ML-% IV SOLN
INTRAVENOUS | Status: DC | PRN
Start: 2019-11-23 — End: 2019-11-23
  Administered 2019-11-23: 50 ug/min via INTRAVENOUS

## 2019-11-23 MED ORDER — HYDROMORPHONE HCL 1 MG/ML IJ SOLN
0.5000 mg | INTRAMUSCULAR | Status: DC | PRN
  Administered 2019-11-23 – 2019-11-26 (×6): 1 mg via INTRAVENOUS
  Administered 2019-11-26: 02:00:00 0.5 mg via INTRAVENOUS
  Administered 2019-11-26 – 2019-11-27 (×3): 1 mg via INTRAVENOUS
  Filled 2019-11-23 (×11): qty 1

## 2019-11-23 MED ORDER — MIDAZOLAM HCL 5 MG/5ML IJ SOLN
INTRAMUSCULAR | Status: DC | PRN
  Administered 2019-11-23: 2 mg via INTRAVENOUS

## 2019-11-23 MED ORDER — WITCH HAZEL-GLYCERIN EX PADS
1.0000 "application " | MEDICATED_PAD | CUTANEOUS | Status: DC | PRN
  Filled 2019-11-23: qty 100

## 2019-11-23 MED ORDER — MIDAZOLAM HCL 2 MG/2ML IJ SOLN
INTRAMUSCULAR | Status: AC
  Filled 2019-11-23: qty 2

## 2019-11-23 MED ORDER — LIDOCAINE HCL 2 % IJ SOLN
INTRAMUSCULAR | Status: AC
  Filled 2019-11-23: qty 20

## 2019-11-23 MED ORDER — PSYLLIUM 95 % PO PACK
1.0000 | PACK | Freq: Two times a day (BID) | ORAL | Status: DC
  Administered 2019-11-23 – 2019-11-26 (×5): 1 via ORAL
  Filled 2019-11-23 (×8): qty 1

## 2019-11-23 MED ORDER — 0.9 % SODIUM CHLORIDE (POUR BTL) OPTIME
TOPICAL | Status: DC | PRN
  Administered 2019-11-23: 1000 mL

## 2019-11-23 MED ORDER — FENTANYL CITRATE (PF) 100 MCG/2ML IJ SOLN
INTRAMUSCULAR | Status: DC | PRN
  Administered 2019-11-23 (×3): 50 ug via INTRAVENOUS

## 2019-11-23 MED ORDER — ONDANSETRON HCL 4 MG/2ML IJ SOLN
INTRAMUSCULAR | Status: DC | PRN
  Administered 2019-11-23: 4 mg via INTRAVENOUS

## 2019-11-23 MED ORDER — ACETAMINOPHEN 500 MG PO TABS
1000.0000 mg | ORAL_TABLET | Freq: Four times a day (QID) | ORAL | Status: DC
  Administered 2019-11-24 – 2019-11-27 (×11): 1000 mg via ORAL
  Filled 2019-11-23 (×12): qty 2

## 2019-11-23 MED ORDER — ENSURE SURGERY PO LIQD
237.0000 mL | Freq: Two times a day (BID) | ORAL | Status: DC
  Administered 2019-11-24 – 2019-12-01 (×10): 237 mL via ORAL
  Filled 2019-11-23 (×16): qty 237

## 2019-11-23 MED ORDER — ACETAMINOPHEN 500 MG PO TABS
1000.0000 mg | ORAL_TABLET | ORAL | Status: AC
  Administered 2019-11-23: 07:00:00 1000 mg via ORAL
  Filled 2019-11-23: qty 2

## 2019-11-23 SURGICAL SUPPLY — 115 items
APL PRP STRL LF DISP 70% ISPRP (MISCELLANEOUS)
APPLIER CLIP 5 13 M/L LIGAMAX5 (MISCELLANEOUS)
APPLIER CLIP ROT 10 11.4 M/L (STAPLE)
APR CLP MED LRG 11.4X10 (STAPLE)
APR CLP MED LRG 5 ANG JAW (MISCELLANEOUS)
BLADE EXTENDED COATED 6.5IN (ELECTRODE) IMPLANT
CANNULA REDUC XI 12-8 STAPL (CANNULA) ×3
CANNULA REDUC XI 12-8MM STAPL (CANNULA) ×1
CANNULA REDUCER 12-8 DVNC XI (CANNULA) IMPLANT
CELLS DAT CNTRL 66122 CELL SVR (MISCELLANEOUS) IMPLANT
CHLORAPREP W/TINT 26 (MISCELLANEOUS) IMPLANT
CLIP APPLIE 5 13 M/L LIGAMAX5 (MISCELLANEOUS) IMPLANT
CLIP APPLIE ROT 10 11.4 M/L (STAPLE) IMPLANT
COVER SURGICAL LIGHT HANDLE (MISCELLANEOUS) ×8 IMPLANT
COVER TIP SHEARS 8 DVNC (MISCELLANEOUS) ×2 IMPLANT
COVER TIP SHEARS 8MM DA VINCI (MISCELLANEOUS) ×4
COVER WAND RF STERILE (DRAPES) ×4 IMPLANT
DECANTER SPIKE VIAL GLASS SM (MISCELLANEOUS) ×4 IMPLANT
DEVICE TROCAR PUNCTURE CLOSURE (ENDOMECHANICALS) IMPLANT
DRAIN CHANNEL 19F RND (DRAIN) IMPLANT
DRAPE ARM DVNC X/XI (DISPOSABLE) ×8 IMPLANT
DRAPE COLUMN DVNC XI (DISPOSABLE) ×2 IMPLANT
DRAPE DA VINCI XI ARM (DISPOSABLE) ×16
DRAPE DA VINCI XI COLUMN (DISPOSABLE) ×4
DRAPE SURG IRRIG POUCH 19X23 (DRAPES) ×4 IMPLANT
DRSG OPSITE POSTOP 4X10 (GAUZE/BANDAGES/DRESSINGS) ×3 IMPLANT
DRSG OPSITE POSTOP 4X6 (GAUZE/BANDAGES/DRESSINGS) IMPLANT
DRSG OPSITE POSTOP 4X8 (GAUZE/BANDAGES/DRESSINGS) IMPLANT
DRSG TEGADERM 2-3/8X2-3/4 SM (GAUZE/BANDAGES/DRESSINGS) ×8 IMPLANT
DRSG TEGADERM 4X4.75 (GAUZE/BANDAGES/DRESSINGS) IMPLANT
ELECT PENCIL ROCKER SW 15FT (MISCELLANEOUS) ×4 IMPLANT
ELECT REM PT RETURN 15FT ADLT (MISCELLANEOUS) ×4 IMPLANT
ENDOLOOP SUT PDS II  0 18 (SUTURE)
ENDOLOOP SUT PDS II 0 18 (SUTURE) IMPLANT
EVACUATOR SILICONE 100CC (DRAIN) IMPLANT
GAUZE SPONGE 2X2 8PLY STRL LF (GAUZE/BANDAGES/DRESSINGS) ×2 IMPLANT
GLOVE ECLIPSE 8.0 STRL XLNG CF (GLOVE) ×12 IMPLANT
GLOVE INDICATOR 8.0 STRL GRN (GLOVE) ×12 IMPLANT
GOWN STRL REUS W/TWL XL LVL3 (GOWN DISPOSABLE) ×20 IMPLANT
GRASPER SUT TROCAR 14GX15 (MISCELLANEOUS) IMPLANT
HOLDER FOLEY CATH W/STRAP (MISCELLANEOUS) ×4 IMPLANT
IRRIG SUCT STRYKERFLOW 2 WTIP (MISCELLANEOUS) ×4
IRRIGATION SUCT STRKRFLW 2 WTP (MISCELLANEOUS) ×2 IMPLANT
KIT PROCEDURE DA VINCI SI (MISCELLANEOUS)
KIT PROCEDURE DVNC SI (MISCELLANEOUS) IMPLANT
KIT SIGMOIDOSCOPE (SET/KITS/TRAYS/PACK) IMPLANT
KIT TURNOVER KIT A (KITS) ×2 IMPLANT
NDL INSUFFLATION 14GA 120MM (NEEDLE) ×2 IMPLANT
NEEDLE INSUFFLATION 14GA 120MM (NEEDLE) ×4 IMPLANT
PACK CARDIOVASCULAR III (CUSTOM PROCEDURE TRAY) ×4 IMPLANT
PAD POSITIONING PINK XL (MISCELLANEOUS) ×4 IMPLANT
PORT LAP GEL ALEXIS MED 5-9CM (MISCELLANEOUS) ×4 IMPLANT
PROTECTOR NERVE ULNAR (MISCELLANEOUS) ×8 IMPLANT
RELOAD STAPLE 45 BLU REG DVNC (STAPLE) IMPLANT
RELOAD STAPLE 45 GRN THCK DVNC (STAPLE) IMPLANT
RELOAD STAPLE 60 2.5 WHT DVNC (STAPLE) IMPLANT
RELOAD STAPLE 60 4.3 GRN DVNC (STAPLE) IMPLANT
RELOAD STAPLER 2.5X60 WHT DVNC (STAPLE) ×2 IMPLANT
RELOAD STAPLER 4.3X60 GRN DVNC (STAPLE) IMPLANT
RETRACTOR WND ALEXIS 18 MED (MISCELLANEOUS) IMPLANT
RTRCTR WOUND ALEXIS 18CM MED (MISCELLANEOUS)
SCISSORS LAP 5X35 DISP (ENDOMECHANICALS) ×4 IMPLANT
SEAL CANN UNIV 5-8 DVNC XI (MISCELLANEOUS) ×6 IMPLANT
SEAL XI 5MM-8MM UNIVERSAL (MISCELLANEOUS) ×12
SEALER VESSEL DA VINCI XI (MISCELLANEOUS) ×4
SEALER VESSEL EXT DVNC XI (MISCELLANEOUS) ×2 IMPLANT
SOLUTION ELECTROLUBE (MISCELLANEOUS) ×4 IMPLANT
SPONGE GAUZE 2X2 STER 10/PKG (GAUZE/BANDAGES/DRESSINGS) ×2
SPONGE LAP 18X18 RF (DISPOSABLE) ×2 IMPLANT
STAPLER 45 BLU RELOAD XI (STAPLE) IMPLANT
STAPLER 45 BLUE RELOAD XI (STAPLE)
STAPLER 45 DA VINCI SURE FORM (STAPLE)
STAPLER 45 GREEN RELOAD XI (STAPLE)
STAPLER 45 GRN RELOAD XI (STAPLE) IMPLANT
STAPLER 45 SUREFORM DVNC (STAPLE) IMPLANT
STAPLER 60 DA VINCI SURE FORM (STAPLE) ×4
STAPLER 60 SUREFORM DVNC (STAPLE) IMPLANT
STAPLER CANNULA SEAL DVNC XI (STAPLE) ×2 IMPLANT
STAPLER CANNULA SEAL XI (STAPLE) ×4
STAPLER PROXIMATE 75MM BLUE (STAPLE) ×2 IMPLANT
STAPLER RELOAD 2.5X60 WHITE (STAPLE) ×4
STAPLER RELOAD 2.5X60 WHT DVNC (STAPLE) ×2
STAPLER RELOAD 4.3X60 GREEN (STAPLE)
STAPLER RELOAD 4.3X60 GRN DVNC (STAPLE)
STOPCOCK 4 WAY LG BORE MALE ST (IV SETS) ×8 IMPLANT
SURGILUBE 2OZ TUBE FLIPTOP (MISCELLANEOUS) IMPLANT
SUT MNCRL AB 4-0 PS2 18 (SUTURE) ×4 IMPLANT
SUT PDS AB 1 CT1 27 (SUTURE) ×8 IMPLANT
SUT PROLENE 0 CT 2 (SUTURE) IMPLANT
SUT PROLENE 2 0 KS (SUTURE) IMPLANT
SUT PROLENE 2 0 SH DA (SUTURE) IMPLANT
SUT SILK 2 0 (SUTURE)
SUT SILK 2 0 SH CR/8 (SUTURE) IMPLANT
SUT SILK 2-0 18XBRD TIE 12 (SUTURE) IMPLANT
SUT SILK 3 0 (SUTURE)
SUT SILK 3 0 SH CR/8 (SUTURE) ×4 IMPLANT
SUT SILK 3-0 18XBRD TIE 12 (SUTURE) IMPLANT
SUT V-LOC BARB 180 2/0GR6 GS22 (SUTURE)
SUT VIC AB 3-0 SH 18 (SUTURE) IMPLANT
SUT VIC AB 3-0 SH 27 (SUTURE)
SUT VIC AB 3-0 SH 27XBRD (SUTURE) IMPLANT
SUT VICRYL 0 UR6 27IN ABS (SUTURE) ×4 IMPLANT
SUT VLOC BARB 180 ABS3/0GR12 (SUTURE) ×8
SUTURE V-LC BRB 180 2/0GR6GS22 (SUTURE) IMPLANT
SUTURE VLOC BRB 180 ABS3/0GR12 (SUTURE) IMPLANT
SYR 10ML ECCENTRIC (SYRINGE) ×4 IMPLANT
SYS LAPSCP GELPORT 120MM (MISCELLANEOUS)
SYSTEM LAPSCP GELPORT 120MM (MISCELLANEOUS) IMPLANT
TOWEL OR NON WOVEN STRL DISP B (DISPOSABLE) ×4 IMPLANT
TRAY COLON PACK (CUSTOM PROCEDURE TRAY) ×4 IMPLANT
TRAY FOLEY MTR SLVR 14FR STAT (SET/KITS/TRAYS/PACK) ×2 IMPLANT
TROCAR ADV FIXATION 5X100MM (TROCAR) ×4 IMPLANT
TUBING CONNECTING 10 (TUBING) ×5 IMPLANT
TUBING CONNECTING 10' (TUBING) ×1
TUBING INSUFFLATION 10FT LAP (TUBING) ×4 IMPLANT

## 2019-11-23 NOTE — Op Note (Signed)
11/23/2019  3:12 PM  PATIENT:  Emily Holloway  64 y.o. 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  This assistant was needed for exposure, dissection, suctioning, retraction, instrument exchange, etc.   ANESTHESIA:   local and general  Anorectal block  Nerve block provided with liposomal bupivacaine (Experel) mixed with 0.25% bupivacaine as a Bilateral TAP block x 21mL each side at the level of the transverse abdominis & preperitoneal spaces along the flank at the anterior axillary line, from subcostal ridge to iliac crest under laparoscopic guidance    EBL:  Total I/O In: 2750 [I.V.:2500; IV Piggyback:250] Out: 400 [Blood:400]  Delay start of Pharmacological VTE agent (>24hrs) due to surgical blood loss or risk of bleeding:  no  DRAINS: none   SPECIMEN:  Ileostomy  DISPOSITION OF SPECIMEN:  PATHOLOGY  COUNTS:  YES  PLAN OF CARE:  Admit to inpatient   PATIENT DISPOSITION:  PACU - hemodynamically stable.  INDICATION:   Pleasant woman with history of rectal cancer status post laparoscopic low anterior resection with coloanal stapled anastomosis.  Colonic J-pouch.  Ileal diversion.  Complicated recovery with diarrhea, incontinence, delayed rectovaginal fistula.  Rediversion.  Ultimately converted to an end ileostomy.  Parastomal hernia requiring emergent reduction repair.  Had long discussion of options.  Recommended ileostomy takedown since there has been no evidence of rectovaginal fistula.  She is undergone MRI of the pelvis which shows acceptable defecation, no recurrent cancer, no fistula.  No evidence of fistula on UA in office.  Tolerated pelvic floor physical therapy.  No coloanal stricturing.  She wished to proceed.  Pleasant patient status post colectomy with end ostomy.  The patient has recovered from that surgery and has understandably requested ostomy takedown.  Medically stabilized and felt reasonable to proceed.   I discussed the procedure with the patient:  The anatomy & physiology of the digestive tract was discussed.  The pathophysiology was discussed.  Possibility of remaining with an ostomy permanently was discussed.  I offered ostomy takedown.  Laparoscopic & open techniques were discussed.   Risks such as bleeding, infection, abscess, leak, reoperation, possible re-ostomy, injury to other organs, hernia, heart attack, death, and other risks were discussed.   I noted a good likelihood this will help address the problem.  Goals of post-operative recovery were discussed as well.  We will work to minimize complications.  Questions were answered.  The patient expresses understanding & wishes to proceed with surgery.  OR FINDINGS: Very dense adhesions of small intestine the anterior abdominal wall.  Moderate sized parastomal hernia left lower quadrant with omentum transverse colon and small bowel  within it.  Dense  interloop adhesions with evidence of probable partial small bowel obstruction due to heavy kinking.  8 x 6 cm fascial defect at the parastomal hernia closed mainly vertically primarily.  She has a end-to-end stapled intracorporeal, isoperistaltic anastomosis of ileum to ascending colon.  It rests in the right lower quadrant.  Intact sphincter tone.  Staple line anastomosis at the white line of [place] within 1 cm of the anal verge.  No stricturing.  Colonoscopy notes some diversion colitis.  Some old retained barium and mucin especially on the right side.  Poor prep but no major obvious lesion.  See Dr. Orest Dikes separate colonoscopy report.  DESCRIPTION:   Informed consent was confirmed.  The patient underwent general anaesthesia without difficulty.  The patient was positioned appropriately.  VTE prevention in place.  Pursestring placed around end ileostomy with silk suture the patient's abdomen was clipped, prepped, & draped in a sterile fashion.  Surgical timeout confirmed our plan.  I did digital rectal exam to confirm normal sphincter tone with minimal stricturing at the coloanal stapled anastomosis.  Easily dilated up.  Intact.  Again confirmed no distal rectovaginal fistula.  Thickening where the fistula used to be.  Mid rectovaginal septum still thinned out but no major rectocele.  No obvious contraindication to ileostomy takedown.  Peritoneal entry with a laparoscopic port was obtained using [place] spring needle entry technique in the left upper abdomen as the patient was positioned in reverse Trendelenburg.  I induced carbon dioxide insufflation.  No change in end tidal CO2 measurements.  Full symmetrical abdominal distention.  Initial port was carefully placed.  Camera inspection revealed no injury.  Extra ports were carefully placed under direct laparoscopic visualization.  XI robot carefully docked.  Proceeded with lysis of adhesions to make sure there were no adhesions on the anterior  abdominal wall, to the ostomy, and to the pelvis.  This took 3 hours.  Extremely dense adhesions especially to the distal anterior abdominal wall for giant parastomal hernia and retroperitoneum.  Inspected small bowel loops to make sure no significant interloop adhesions or transition zones noted.  We then proceeded to free off interloop adhesions.  Found a couple tightly kinked areas and freed those off.  Fortunately, there were not low pelvic adhesions.  That had already been closed off in the past.  Eventually got the mesentery solved the small bowel was in the upper abdomen.  Freed adhesions of the distal ileum going up into the left parastomal hernia.  I then focused on sweeping the entire small bowel off the right retroperitoneum to expose the remaining colon.  I could find his ascending colon and follow it more proximally.  I found the Prolene sutures that I had placed previously.  Mobilized the descending colon a lateral medial fashion as well for some mobility.  We then undocked the colon.  I made a biconcave curvilinear incision transversely around the ostomy.  I got into the subcutaneous tissues and her giant hernia sac around her parastomal hernia..  I used careful focused sharp dissection.  Some focused cautery dissection as well.  That helped to free adhesions to the subcutaneous tisses & fascia.  I was able to enter into the peritoneum focally.  I did a gentle finger sweep.  Gradually came around circumferentially and freed the bowel from remaining adhesions to the abdominal wall.  I resected a few centimeters more proximally and transected with a GIA-75 stapler.  I took the intervening end ileostomy mesentery with  clamps and silk ties.  We then eviscerated the small intestine through the large parastomal hernia fascial defect.  Ran the small bowel more proximally.  Freed off further interloop adhesions sharply.  Found one small serosal thinned out area that I brought together with a silk suture  transversely.  We ran the small bowel all the way to the ligament of Treitz and saw no abnormalities.  Mesentery was quite open and splayed healthy.  No enterotomies or there issues.  We returned the small bowel proximal to distal.  This allowed the ileum to sweep from the left lower quadrant across the upper pelvis to the right lower quadrant for implant intracorporeal anastomosis.  We redocked the robot.  Again ran the small bowel robotically for direct orientation.  No twisting or volvulus.  Healthy and viable. I did a side-to-side stapled anastomosis of ileum to proximal ascending colon using a 84mm white load robotic stapler in an isoperistaltic fashion.  (Distal stump of ileum to mid transverse colon for the distal end of the anastomosis.  Proximal end of colon stump to more proximal ileum for the proximal end of the anastomosis).  I sewed the common staple channel wound with an absorbable suture ( 2-0 V-lock) in a running [place] fashion from each corner and meeting in the center.  I did meticulous inspection prove an airtight closure.  I protected the anastomosis line with an anterior omentopexy of greater omentum using V lock suture.  We did reinspection of the abdomen.  Hemostasis was good.   Ureters, retroperitoneum, and bowel uninjured.  The anastomosis looked healthy.   We irrigation of several liters of isotonic fluid until he had a more clear return.  Dr Dema Severin scrubbed down and performed colonoscopy.  Please see his separate note.  I clamped the ileum just proximal anastomosis.  He did colonoscopy with carbon dioxide insufflation.  Left colon had obvious diversion colitis.  Some old mucin and possible barium.  More prominent more proximally, but he was able to go all the way to the ileocolonic anastomosis.  It was widely patent.  Good hemostasis.  Mucosa viable.  There was no evidence of any bubbling or leak consistent with an airtight seal.  He desufflated with withdrawal.  While visualization  was poor there was no major abnormalities.  Please see his separate note.    Endoluminal gas was evacuated.  Ports & wound protector removed.  We changed gloves & redraped the patient per colon SSI prevention protocol.  We aspirated the antibiotic irrigation.  Hemostasis was good.  Sterile unused instruments were used from this point.  I closed the skin at the port sites using Monocryl stitch and sterile dressing.  I was able to free of hernia sac and identified the fascial edges of the parastomal defect in the left lower quadrant.  It was somewhat oblique but mainly vertical.  We approximated this using #1 PDS suture in a running fashion.  I excised some redundant hernia sac and skin.  This help provide a mostly vertical but somewhat oblique wound.  Closed it with 2-0 Vicryl sutures at Scarpa's fascia.  I closed the skin with some interrupted Monocryl stitches. I placed antibiotic-soaked wicks into the closure at the corners x2.  I placed sterile dressings.     Patient extubated.  Stable to recovery room.  Comfortable.  I discussed postop care with the patient in detail the office & in the holding area. Instructions are written.  I discussed operative findings, updated the patient's  status, discussed probable steps to recovery, and gave postoperative recommendations to the patient's spouse.  Recommendations were made.  Questions were answered.  He was very thankful for our care.  He expressed understanding & appreciation.    Adin Hector, M.D., F.A.C.S. Gastrointestinal and Minimally Invasive Surgery [facility] [address] [place], [place] [address] [phone] Main / Paging

## 2019-11-23 NOTE — Plan of Care (Signed)
  Problem: Education: Goal: Knowledge of General Education information will improve Description: Including pain rating scale, medication(s)/side effects and non-pharmacologic comfort measures Outcome: Progressing   Problem: Pain Managment: Goal: General experience of comfort will improve Outcome: Progressing   

## 2019-11-23 NOTE — Discharge Instructions (Signed)
SURGERY: POST OP INSTRUCTIONS (Surgery for small bowel obstruction, colon resection, etc)   ######################################################################  EAT Gradually transition to a high fiber diet with a fiber supplement over the next few days after discharge  WALK Walk an hour a day.  Control your pain to do that.    CONTROL PAIN Control pain so that you can walk, sleep, tolerate sneezing/coughing, go up/down stairs.  HAVE A BOWEL MOVEMENT DAILY Keep your bowels regular to avoid problems.  OK to try a laxative to override constipation.  OK to use an antidairrheal to slow down diarrhea.  Call if not better after 2 tries  CALL IF YOU HAVE PROBLEMS/CONCERNS Call if you are still struggling despite following these instructions. Call if you have concerns not answered by these instructions  ######################################################################   DIET Follow a light diet the first few days at home.  Start with a bland diet such as soups, liquids, starchy foods, low fat foods, etc.  If you feel full, bloated, or constipated, stay on a ful liquid or pureed/blenderized diet for a few days until you feel better and no longer constipated. Be sure to drink plenty of fluids every day to avoid getting dehydrated (feeling dizzy, not urinating, etc.). Gradually add a fiber supplement to your diet over the next week.  Gradually get back to a regular solid diet.  Avoid fast food or heavy meals the first week as you are more likely to get nauseated. It is expected for your digestive tract to need a few months to get back to normal.  It is common for your bowel movements and stools to be irregular.  You will have occasional bloating and cramping that should eventually fade away.  Until you are eating solid food normally, off all pain medications, and back to regular activities; your bowels will not be normal. Focus on eating a low-fat, high fiber diet the rest of your life  (See Getting to Good Bowel Health, below).  CARE of your INCISION or WOUND It is good for closed incision and even open wounds to be washed every day.  Shower every day.  Short baths are fine.  Wash the incisions and wounds clean with soap & water.    If you have a closed incision(s), wash the incision with soap & water every day.  You may leave closed incisions open to air if it is dry.   You may cover the incision with clean gauze & replace it after your daily shower for comfort. If you have skin tapes (Steristrips) or skin glue (Dermabond) on your incision, leave them in place.  They will fall off on their own like a scab.  You may trim any edges that curl up with clean scissors.  If you have staples, set up an appointment for them to be removed in the office in 10 days after surgery.  If you have a drain, wash around the skin exit site with soap & water and place a new dressing of gauze or band aid around the skin every day.  Keep the drain site clean & dry.    If you have an open wound with packing, see wound care instructions.  In general, it is encouraged that you remove your dressing and packing, shower with soap & water, and replace your dressing once a day.  Pack the wound with clean gauze moistened with normal (0.9%) saline to keep the wound moist & uninfected.  Pressure on the dressing for 30 minutes will stop most wound   bleeding.  Eventually your body will heal & pull the open wound closed over the next few months.  Raw open wounds will occasionally bleed or secrete yellow drainage until it heals closed.  Drain sites will drain a little until the drain is removed.  Even closed incisions can have mild bleeding or drainage the first few days until the skin edges scab over & seal.   If you have an open wound with a wound vac, see wound vac care instructions.     ACTIVITIES as tolerated Start light daily activities --- self-care, walking, climbing stairs-- beginning the day after surgery.   Gradually increase activities as tolerated.  Control your pain to be active.  Stop when you are tired.  Ideally, walk several times a day, eventually an hour a day.   Most people are back to most day-to-day activities in a few weeks.  It takes 4-8 weeks to get back to unrestricted, intense activity. If you can walk 30 minutes without difficulty, it is safe to try more intense activity such as jogging, treadmill, bicycling, low-impact aerobics, swimming, etc. Save the most intensive and strenuous activity for last (Usually 4-8 weeks after surgery) such as sit-ups, heavy lifting, contact sports, etc.  Refrain from any intense heavy lifting or straining until you are off narcotics for pain control.  You will have off days, but things should improve week-by-week. DO NOT PUSH THROUGH PAIN.  Let pain be your guide: If it hurts to do something, don't do it.  Pain is your body warning you to avoid that activity for another week until the pain goes down. You may drive when you are no longer taking narcotic prescription pain medication, you can comfortably wear a seatbelt, and you can safely make sudden turns/stops to protect yourself without hesitating due to pain. You may have sexual intercourse when it is comfortable. If it hurts to do something, stop.  MEDICATIONS Take your usually prescribed home medications unless otherwise directed.   Blood thinners:  Usually you can restart any strong blood thinners after the second postoperative day.  It is OK to take aspirin right away.     If you are on strong blood thinners (warfarin/Coumadin, Plavix, Xerelto, Eliquis, Pradaxa, etc), discuss with your surgeon, medicine PCP, and/or cardiologist for instructions on when to restart the blood thinner & if blood monitoring is needed (PT/INR blood check, etc).     PAIN CONTROL Pain after surgery or related to activity is often due to strain/injury to muscle, tendon, nerves and/or incisions.  This pain is usually  short-term and will improve in a few months.  To help speed the process of healing and to get back to regular activity more quickly, DO THE FOLLOWING THINGS TOGETHER: 1. Increase activity gradually.  DO NOT PUSH THROUGH PAIN 2. Use Ice and/or Heat 3. Try Gentle Massage and/or Stretching 4. Take over the counter pain medication 5. Take Narcotic prescription pain medication for more severe pain  Good pain control = faster recovery.  It is better to take more medicine to be more active than to stay in bed all day to avoid medications. 1.  Increase activity gradually Avoid heavy lifting at first, then increase to lifting as tolerated over the next 6 weeks. Do not "push through" the pain.  Listen to your body and avoid positions and maneuvers than reproduce the pain.  Wait a few days before trying something more intense Walking an hour a day is encouraged to help your body recover faster   and more safely.  Start slowly and stop when getting sore.  If you can walk 30 minutes without stopping or pain, you can try more intense activity (running, jogging, aerobics, cycling, swimming, treadmill, sex, sports, weightlifting, etc.) Remember: If it hurts to do it, then don't do it! 2. Use Ice and/or Heat You will have swelling and bruising around the incisions.  This will take several weeks to resolve. Ice packs or heating pads (6-8 times a day, 30-60 minutes at a time) will help sooth soreness & bruising. Some people prefer to use ice alone, heat alone, or alternate between ice & heat.  Experiment and see what works best for you.  Consider trying ice for the first few days to help decrease swelling and bruising; then, switch to heat to help relax sore spots and speed recovery. Shower every day.  Short baths are fine.  It feels good!  Keep the incisions and wounds clean with soap & water.   3. Try Gentle Massage and/or Stretching Massage at the area of pain many times a day Stop if you feel pain - do not  overdo it 4. Take over the counter pain medication This helps the muscle and nerve tissues become less irritable and calm down faster Choose ONE of the following over-the-counter anti-inflammatory medications: Acetaminophen 500mg tabs (Tylenol) 1-2 pills with every meal and just before bedtime (avoid if you have liver problems or if you have acetaminophen in you narcotic prescription) Naproxen 220mg tabs (ex. Aleve, Naprosyn) 1-2 pills twice a day (avoid if you have kidney, stomach, IBD, or bleeding problems) Ibuprofen 200mg tabs (ex. Advil, Motrin) 3-4 pills with every meal and just before bedtime (avoid if you have kidney, stomach, IBD, or bleeding problems) Take with food/snack several times a day as directed for at least 2 weeks to help keep pain / soreness down & more manageable. 5. Take Narcotic prescription pain medication for more severe pain A prescription for strong pain control is often given to you upon discharge (for example: oxycodone/Percocet, hydrocodone/Norco/Vicodin, or tramadol/Ultram) Take your pain medication as prescribed. Be mindful that most narcotic prescriptions contain Tylenol (acetaminophen) as well - avoid taking too much Tylenol. If you are having problems/concerns with the prescription medicine (does not control pain, nausea, vomiting, rash, itching, etc.), please call us [phone] to see if we need to switch you to a different pain medicine that will work better for you and/or control your side effects better. If you need a refill on your pain medication, you must call the office before 4 pm and on weekdays only.  By federal law, prescriptions for narcotics cannot be called into a pharmacy.  They must be filled out on paper & picked up from our office by the patient or authorized caretaker.  Prescriptions cannot be filled after 4 pm nor on weekends.    WHEN TO CALL US [phone] Severe uncontrolled or worsening pain  Fever over 101 F (38.5 C) Concerns with  the incision: Worsening pain, redness, rash/hives, swelling, bleeding, or drainage Reactions / problems with new medications (itching, rash, hives, nausea, etc.) Nausea and/or vomiting Difficulty urinating Difficulty breathing Worsening fatigue, dizziness, lightheadedness, blurred vision Other concerns If you are not getting better after two weeks or are noticing you are getting worse, contact our office [phone] for further advice.  We may need to adjust your medications, re-evaluate you in the office, send you to the emergency room, or see what other things we can do to help. The   clinic staff is available to answer your questions during regular business hours (8:30am-5pm).  Please don't hesitate to call and ask to speak to one of our nurses for clinical concerns.    A surgeon from [facility] is always on call at the hospitals 24 hours/day If you have a medical emergency, go to the nearest emergency room or call 911.  FOLLOW UP in our office One the day of your discharge from the hospital (or the next business weekday), please call [facility] to set up or confirm an appointment to see your surgeon in the office for a follow-up appointment.  Usually it is 2-3 weeks after your surgery.   If you have skin staples at your incision(s), let the office know so we can set up a time in the office for the nurse to remove them (usually around 10 days after surgery). Make sure that you call for appointments the day of discharge (or the next business weekday) from the hospital to ensure a convenient appointment time. IF YOU HAVE DISABILITY OR FAMILY LEAVE FORMS, BRING THEM TO THE OFFICE FOR PROCESSING.  DO NOT GIVE THEM TO YOUR DOCTOR.  [facility] [address], [address], [place],   [address] ? [phone] - Main [phone] - Toll Free,  [phone] - Fax [email or web address]  GETTING TO GOOD BOWEL HEALTH. It is  expected for your digestive tract to need a few months to get back to normal.  It is common for your bowel movements and stools to be irregular.  You will have occasional bloating and cramping that should eventually fade away.  Until you are eating solid food normally, off all pain medications, and back to regular activities; your bowels will not be normal.   Avoiding constipation The goal: ONE SOFT BOWEL MOVEMENT A DAY!    Drink plenty of fluids.  Choose water first. TAKE A FIBER SUPPLEMENT EVERY DAY THE REST OF YOUR LIFE During your first week back home, gradually add back a fiber supplement every day Experiment which form you can tolerate.   There are many forms such as powders, tablets, wafers, gummies, etc Psyllium bran (Metamucil), methylcellulose (Citrucel), Miralax or Glycolax, Benefiber, Flax Seed.  Adjust the dose week-by-week (1/2 dose/day to 6 doses a day) until you are moving your bowels 1-2 times a day.  Cut back the dose or try a different fiber product if it is giving you problems such as diarrhea or bloating. Sometimes a laxative is needed to help jump-start bowels if constipated until the fiber supplement can help regulate your bowels.  If you are tolerating eating & you are farting, it is okay to try a gentle laxative such as double dose MiraLax, prune juice, or Milk of Magnesia.  Avoid using laxatives too often. Stool softeners can sometimes help counteract the constipating effects of narcotic pain medicines.  It can also cause diarrhea, so avoid using for too long. If you are still constipated despite taking fiber daily, eating solids, and a few doses of laxatives, call our office. Controlling diarrhea Try drinking liquids and eating bland foods for a few days to avoid stressing your intestines further. Avoid dairy products (especially milk & ice cream) for a short time.  The intestines often can lose the ability to digest lactose when stressed. Avoid foods that cause gassiness or  bloating.  Typical foods include beans and other legumes, cabbage, broccoli, and dairy foods.  Avoid greasy, spicy, fast foods.  Every person has   some sensitivity to other foods, so listen to your body and avoid those foods that trigger problems for you. Probiotics (such as active yogurt, Align, etc) may help repopulate the intestines and colon with normal bacteria and calm down a sensitive digestive tract Adding a fiber supplement gradually can help thicken stools by absorbing excess fluid and retrain the intestines to act more normally.  Slowly increase the dose over a few weeks.  Too much fiber too soon can backfire and cause cramping & bloating. It is okay to try and slow down diarrhea with a few doses of antidiarrheal medicines.   Bismuth subsalicylate (ex. Kayopectate, Pepto Bismol) for a few doses can help control diarrhea.  Avoid if pregnant.   Loperamide (Imodium) can slow down diarrhea.  Start with one tablet (2mg) first.  Avoid if you are having fevers or severe pain.  ILEOSTOMY PATIENTS WILL HAVE CHRONIC DIARRHEA since their colon is not in use.    Drink plenty of liquids.  You will need to drink even more glasses of water/liquid a day to avoid getting dehydrated. Record output from your ileostomy.  Expect to empty the bag every 3-4 hours at first.  Most people with a permanent ileostomy empty their bag 4-6 times at the least.   Use antidiarrheal medicine (especially Imodium) several times a day to avoid getting dehydrated.  Start with a dose at bedtime & breakfast.  Adjust up or down as needed.  Increase antidiarrheal medications as directed to avoid emptying the bag more than 8 times a day (every 3 hours). Work with your wound ostomy nurse to learn care for your ostomy.  See ostomy care instructions. TROUBLESHOOTING IRREGULAR BOWELS 1) Start with a soft & bland diet. No spicy, greasy, or fried foods.  2) Avoid gluten/wheat or dairy products from diet to see if symptoms improve. 3) Miralax  17gm or flax seed mixed in 8oz. water or juice-daily. May use 2-4 times a day as needed. 4) Gas-X, Phazyme, etc. as needed for gas & bloating.  5) Prilosec (omeprazole) over-the-counter as needed 6)  Consider probiotics (Align, Activa, etc) to help calm the bowels down  Call your doctor if you are getting worse or not getting better.  Sometimes further testing (cultures, endoscopy, X-ray studies, CT scans, bloodwork, etc.) may be needed to help diagnose and treat the cause of the diarrhea. [facility] [address], [address], [place], [place]  [address] [phone] - Main.    [phone]  - Toll Free.   [phone] - Fax [email or web address]   Pelvic floor muscle training exercises ("Kegels") can help strengthen the muscles under the uterus, bladder, and bowel (large intestine). They can help both men and women who have problems with urine leakage or bowel control.  A pelvic floor muscle training exercise is like pretending that you have to urinate, and then holding it. You relax and tighten the muscles that control urine flow. It's important to find the right muscles to tighten.  The next time you have to urinate, start to go and then stop. Feel the muscles in your vagina, bladder, or anus get tight and move up. These are the pelvic floor muscles. If you feel them tighten, you've done the exercise right. If you are still not sure whether you are tightening the right muscles, keep in mind that all of the muscles of the pelvic floor relax and contract at the same time. Because these muscles control the bladder, rectum, and vagina, the following tips   may help: Women: Insert a finger into your vagina. Tighten the muscles as if you are holding in your urine, then let go. You should feel the muscles tighten and move up and down.  Men: Insert a finger into your rectum. Tighten the muscles as if you are holding in your urine, then let go. You should feel the  muscles tighten and move up and down. These are the same muscles you would tighten if you were trying to prevent yourself from passing gas.  It is very important that you keep the following muscles relaxed while doing pelvic floor muscle training exercises: Abdominal  Buttocks (the deeper, anal sphincter muscle should contract)  Thigh   A woman can also strengthen these muscles by using a vaginal cone, which is a weighted device that is inserted into the vagina. Then you try to tighten the pelvic floor muscles to hold the device in place. If you are unsure whether you are doing the pelvic floor muscle training correctly, you can use biofeedback and electrical stimulation to help find the correct muscle group to work. Biofeedback is a method of positive reinforcement. Electrodes are placed on the abdomen and along the anal area. Some therapists place a sensor in the vagina in women or anus in men to monitor the contraction of pelvic floor muscles.  A monitor will display a graph showing which muscles are contracting and which are at rest. The therapist can help find the right muscles for performing pelvic floor muscle training exercises.   PERFORMING PELVIC FLOOR EXERCISES: 1. Begin by emptying your bladder. 2. Tighten the pelvic floor muscles and hold for a count of 10. 3. Relax the muscles completely for a count of 10. 4. Do 10 repititions, 3 to 5 times a day (morning, afternoon, and night). You can do these exercises at any time and any place. Most people prefer to do the exercises while lying down or sitting in a chair. After 4 - 6 weeks, most people notice some improvement. It may take as long as 3 months to see a major change. After a couple of weeks, you can also try doing a single pelvic floor contraction at times when you are likely to leak (for example, while getting out of a chair). A word of caution: Some people feel that they can speed up the progress by increasing the number of  repetitions and the frequency of exercises. However, over-exercising can instead cause muscle fatigue and increase urine leakage. If you feel any discomfort in your abdomen or back while doing these exercises, you are probably doing them wrong. Breathe deeply and relax your body when you are doing these exercises. Make sure you are not tightening your stomach, thigh, buttock, or chest muscles. When done the right way, pelvic floor muscle exercises have been shown to be very effective at improving urinary continence.  Pelvic Floor Pain / Incontinence  Do you suffer from pelvic pain or incontinence? Do you have pain in the pelvis, low back or hips that is associated with sitting, walking, urination or intercourse? Have you experienced leaking of urine or feces when coughing, sneezing or laughing? Do you have pain in the pelvic area associated with cancer?  These are conditions that are common with pelvic floor muscle dysfunction. Over time, due to stress, scar tissue, surgeries and the natural course of aging, our muscles may become weak or overstressed and can spasm. This can lead to pain, weakness, incontinence or decreased quality of life.  Men and   women with pelvic floor dysfunction frequently describe:  A "falling out" feeling. Pain or burning in the abdomen, tailbone or perineal area. Constipation or bowel elimination problems or difficulty initiating urination. Unresolved low back or hip pain. Frequency and urgency when going to the bathroom. Leaking of urine or feces. Pain with intercourse.  [email or web address]  To make a referral or for more information about [facility], call  [place] ([place]) - [phone]  ([place]) - [phone] [place] ([facility]) - [phone]  

## 2019-11-23 NOTE — Anesthesia Preprocedure Evaluation (Signed)
Anesthesia Evaluation  Patient identified by MRN, date of birth, ID band Patient awake    Reviewed: Allergy & Precautions, NPO status , Patient's Chart, lab work & pertinent test results  Airway Mallampati: II  TM Distance: >3 FB Neck ROM: Full    Dental no notable dental hx.    Pulmonary neg pulmonary ROS, former smoker,    Pulmonary exam normal breath sounds clear to auscultation       Cardiovascular negative cardio ROS Normal cardiovascular exam Rhythm:Regular Rate:Normal     Neuro/Psych negative neurological ROS  negative psych ROS   GI/Hepatic negative GI ROS, Neg liver ROS,   Endo/Other  negative endocrine ROS  Renal/GU negative Renal ROS  negative genitourinary   Musculoskeletal negative musculoskeletal ROS (+)   Abdominal   Peds negative pediatric ROS (+)  Hematology negative hematology ROS (+)   Anesthesia Other Findings   Reproductive/Obstetrics negative OB ROS                             Anesthesia Physical Anesthesia Plan  ASA: II  Anesthesia Plan: General   Post-op Pain Management:    Induction: Intravenous  PONV Risk Score and Plan: 3 and Ondansetron, Dexamethasone, Scopolamine patch - Pre-op and Treatment may vary due to age or medical condition  Airway Management Planned: Oral ETT  Additional Equipment:   Intra-op Plan:   Post-operative Plan: Extubation in OR  Informed Consent: I have reviewed the patients History and Physical, chart, labs and discussed the procedure including the risks, benefits and alternatives for the proposed anesthesia with the patient or authorized representative who has indicated his/her understanding and acceptance.     Dental advisory given  Plan Discussed with: CRNA and Surgeon  Anesthesia Plan Comments:         Anesthesia Quick Evaluation

## 2019-11-23 NOTE — Anesthesia Procedure Notes (Signed)
Procedure Name: Intubation Date/Time: 11/23/2019 8:36 AM Performed by: Gerald Leitz, CRNA Pre-anesthesia Checklist: Patient identified, Patient being monitored, Timeout performed, Emergency Drugs available and Suction available Patient Re-evaluated:Patient Re-evaluated prior to induction Oxygen Delivery Method: Circle system utilized Preoxygenation: Pre-oxygenation with 100% oxygen Induction Type: IV induction Ventilation: Mask ventilation without difficulty Laryngoscope Size: Mac and 3 Grade View: Grade I Tube type: Oral Tube size: 7.0 mm Number of attempts: 1 Placement Confirmation: ETT inserted through vocal cords under direct vision,  positive ETCO2 and breath sounds checked- equal and bilateral Secured at: 21 cm Tube secured with: Tape Dental Injury: Teeth and Oropharynx as per pre-operative assessment

## 2019-11-23 NOTE — H&P (Signed)
Emily Holloway    DOB: 09/28/1955  Married / Language: Cleophus Molt / Race: White  Female  History of Present Illness  Patient Care Team: Carol Ada, MD as PCP - General (Family Medicine) Gatha Mayer, MD as Consulting Physician (Gastroenterology) Michael Boston, MD (General Surgery) Bjorn Loser, MD as Consulting Physician (Urology) Karle Starch, MD as Consulting Physician (Medical Oncology) Cheryll Cockayne, MD as Consulting Physician (Colon and Rectal Surgery) Aviva Signs, MD as Consulting Physician (Gastroenterology)   The patient is a 64 year old female who presents for a hernia surgery post-op. Note for "Hernia surgery post-op": `  `  `  The patient returns s/p emergent lysis of adhesions with reduction of incarcerated incisional hernia and primary repair by Dr. Harlow Asa 03/18/2019  The patient returns to clinic after surgery, gradually improving. Pain from the incisions is fading away. Denies nausea, constipation/diarrhea, worsening fatigue, high fevers, or other concerns.  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 It still has moderate bulging there. She is emptying the bag 6-8 times a day. Appetite better. Energy level better. Not taking any antidiarrheals. Soreness gone down. We had a long conversation in the hospital after the urgent surgery. Wished to reestablish with me again. She is concerned that she has persistent bulging around her ileostomy. She is does not want to go through the chance of having worsening hernia or another episode of incarceration at this one. Feels like surgery needed to fix this.  Patient diagnosis very distal rectal cancer status post laparoscopic low anterior resection with J puch coloanal anastomosis and loop ileostomy diversion. November 2010. Wounds recurrence. Had Loop ileostomy takedown. Had difficulty with diarrhea and developed a rectovaginal fistula. She was rediverted. Biopsy disproves cancer. An advancement flap  closure. Martius labial fat pad repair with radiology. Ileostomy takedown parent recurrent symptoms and concern for fistula. Converted to hand ileostomy and left lower quadrant. 2013. Sent for second opinion at [facility] Dr Morton Stall. No evidence of rectovaginal fistula on 2014. However she was found to have a solitary lung mass was resected consistent with metastatic disease. She decided hold off on reconsidering any ostomy takedown. Post-adjuvant chemotherapy. No evidence of any dizziness recurrence for the past 6 years. She did have some bulging around her ileostomy when I saw her 3 years ago. Then she developed bulging at her old right lower quadrant ostomy site/incisional hernia. She came in obstructed requiring emergency surgery by our group 02/2019. She comes in for follow-up. She has had underwhelming colonoscopies 2015 and 2018.  We have discussed options. Her end ileostomy parastomal hernias gotten larger. She is undergone pelvic for physical therapy with improvement. She had an MRI of the pelvis showed no evidence of rectovaginal fistula nor local recurrence. Labial fat pad in place. Thinning but no definite evidence of recurrent fistula. We did a methylene blue retention enema transanally. No evidence of any staining on the vaginal tampon after holding for 10 minutes.  `  `  ADDENDUM REPORT: 09/12/2019 11:44 ADDENDUM: These results were discussed with Dr. Johney Maine by telephone. The above-described right puborectalis muscule defect with herniation of fat is consistent with patient's past surgical history of a Martius flap procedure. Electronically Signed By: Marlaine Hind M.D. On: 09/12/2019 11:44 Addended by Earle Gell, MD on 09/12/2019 11:47 AM  Study Result CLINICAL DATA: Tenesmus. 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 Previous low anterior resection for rectal carcinoma. Evaluate for rectocele, rectal fistula, and recurrent carcinoma. Creatinine was obtained on site at [facility]  Imaging at [address] Results: Creatinine 0.7 mg/dL. EXAM: MRI PELVIS WITHOUT AND WITH CONTRAST TECHNIQUE: Multiplanar multisequence MR imaging of the pelvis was performed both before and after administration of intravenous contrast. Exam was initially performed on 08/09/2019 without IV contrast following the pelvic organ prolapse/defecography protocol, after administration of ultrasound gel per rectum . The patient subsequently returned on 09/09/2019 for exam performed without and with IV contrast following the protocol for evaluation for fistula and recurrent carcinoma. CONTRAST: 13 mL MultiHance COMPARISON: None. FINDINGS: Lower Urinary Tract: Unremarkable urinary bladder. A small defect is seen in the right puborectalis muscle with herniation of fat along the right lateral aspect of the distal urethra and inferior vagina. Bowel: Postop changes are seen from previous low anterior resection. Cicatrization of the rectum to the left anterior mesorectal fascia. The anterior rectal wall is inseparable from the posterior vaginal wall, and there is a traction diverticulum of the anterior rectal wall dislocation at this location, however, no fluid or gas is seen within the vagina. There is no evidence of perianal fistula or abscess. There is no evidence recurrent soft tissue mass involving the rectum or anus. Incidental note is made of a large left lower quadrant parastomal hernia at colostomy site. Vascular/Lymphatic: Multiple small lymph nodes are seen within the perirectal fat, largest measuring 6 mm in short axis (09/09/2019, image 13/series 10). No extra mesorectal lymphadenopathy identified. Reproductive: Small postmenopausal uterus and ovaries are seen. No evidence of pelvic mass or free fluid. Other: None. Musculoskeletal: No significant abnormality identified. Measurements at Rest: H line: 4.7 cm M line: 1.4 cm Levator plate angle: 14 degrees Measurements during Defecation: H line: 5.8 cm M line: 3.5 cm Levator plate  angle: 30 degrees ANTERIOR COMPARTMENT Bladder base descent below PCL: None Cystocele grade: None Anterior urethral hypermobility >30 degrees: No MIDDLE COMPARTMENT Uterus descent below PCL: None CUL-DE-SAC Enterocele/Peritoneocele/Sigmoidocele: None POSTERIOR COMPARTMENT Rectocele: None Rectocele Grade: None Intussception: No Other Functional Abnormalities noted during defecation: Incomplete rectal emptying Paradoxical puborectal contraction/Anismus: No Defects in Levator Ani Musculature: None IMPRESSION: 1. No evidence of recurrent soft tissue mass involving the rectum or anus. Nonspecific shotty perirectal lymph nodes, largest measuring 6 mm. 2. No evidence of perianal fistula or abscess. 3. Postop changes, with cicatrization of rectum to left anterior mesorectal fascia and posterior vaginal wall. A traction diverticulum of the anterior rectal wall is seen at this location, however there is no definite evidence of persistent rectovaginal fistula. 4. Defect in the right puborectalis muscle, with herniation of fat along the right lateral aspect of the distal urethra and inferior vagina. 5. Incomplete rectal emptying with defecation, but no evidence of cystocele, uterine prolapse, or rectocele. 6. Large left lower quadrant parastomal hernia. Electronically Signed: By: Marlaine Hind M.D. On: 09/12/2019 10:27  Result History  MR PELVIS W WO CONTRAST (Order [id number]) on 09/12/2019 - Order Result History Report - Result Edited <[email or web address]>  MyChart Results Release  MyChart Status: Active Results Release <[email or web address]>  Encounter-Level Documents - 08/09/2019:  Electronic signature on 08/09/2019 10:53 AM - E-signed <[email or web address]>  Scan on 08/09/2019 11:38 AM by Default, Provider, MD <[email or web address]>Scan on 08/09/2019 11:38 AM by Default, Provider, MD  Scan on 08/05/2019 2:02 PM by Vernon Prey: benefits <[email or web address]>Scan on 08/05/2019 2:02 PM by Vernon Prey: benefits  Scan on 08/05/2019 2:02 PM by Vernon PreyJosem Kaufmann <[email or web address]>Scan on 08/05/2019 2:02 PM by Vernon PreyJosem Kaufmann  Scan on 07/08/2019 2:51 PM by Default, Provider, MD <[email or web address]>Scan on 07/08/2019 2:51 PM by  Default, Provider, MD  Order-Level Documents:  There are no order-level documents.  Orders Requiring a Screening Form  Procedure Order Status Form Status  MR PELVIS W WO CONTRAST <[email or web address]> Completed Completed  Vitals  Height Weight BMI (Calculated)  Protocol Documents  Imaging Protocol <[email or web address]>  Imaging  Imaging Information <[email or web address]>  Resulted by:  Signed Date/Time Phone Pager  Earle Gell 09/12/2019 10:27 AM [id number] [phone]  Study Notes <[email or web address]  Samuel Jester D on 09/09/2019 2:48 PM  MR Pelvis wo/w: 13 ml multihance Labs @315 , Creat=0.7, GFR=94 Pt came back for repeat study today. Per Dr. Kris Hartmann needs to be fistula protocol. Images attached to prior exam. [place]  Braden, Lowry Bowl, RT on 08/09/2019 11:39 AM  Mri pelvis/defacography 120 cc gel inserted. Max tolerated. Hx of colon cancer resection. Recurrent rectovaginal fistula. Patient evacuated as much gel as possible. Repeated max evacuate series several times. bb  Operative Note  Pre-operative Diagnosis: Incarcerated incisional hernia with small bowel obstruction  Post-operative Diagnosis: same  Surgeon: Armandina Gemma, MD  Assistant: Romana Juniper, MD  Procedure: Exploratory laparotomy, lysis of adhesions, primary closure of incisional hernia  Anesthesia: General  Estimated Blood Loss: 75 cc  Drains: 19 French Blake drain to subcutaneous space  Specimen: None  Indications: Patient is a 64 year old female admitted from the emergency department with signs and symptoms of small bowel obstruction secondary to an incarcerated incisional hernia. Patient has a complex past surgical history. Patient was treated with  nasogastric decompression intravenous fluid hydration. An attempt was made by the admitting surgeon to reduce the hernia. Patient remained symptomatic with pain at the site. Patient was therefore prepared and brought to the operating room for exploration.  Procedure Details: The patient was seen in the pre-op holding area. The risks, benefits, complications, treatment options, and expected outcomes were previously discussed with the patient. The patient agreed with the proposed plan and has signed the informed consent form. The patient was brought to the operating room by the surgical team, identified as Charolotte Eke and the procedure verified. A "time out" was completed and the above information confirmed.  Following administration of general anesthesia, the patient was prepped and draped in the usual aseptic fashion. After ascertaining that an adequate level of anesthesia been achieved, the previous oblique incision in the right lower quadrant of the abdominal wall was reopened with a #10 blade. Dissection was carried into subcutaneous tissues using the electrocautery for hemostasis. A large hernia sac is present. This is dissected out of the subcutaneous tissues with the electrocautery. Dissection was carried down to the fascia circumferentially. Hernia sac is opened. It contained several loops of small intestine which appeared moderately dilated. Hernia sac is dissected away from the bowel. Sharp dissection of adhesions is performed with the Metzenbaum scissors. The fascia is identified and the fascia is incised along the previous incision with the electrocautery. This allows for some mobilization of the small bowel. Lysis of adhesions was then performed circumferentially around the margins of the hernia defect allowing for delivery of several loops of small bowel for inspection. The point of obstruction appears to be an area that was densely adhesed to the edge of the hernia defect. 1 small serosal tear  was created and this was closed with interrupted 3-0 silk simple sutures. Several loops of small bowel were mobilized and lysis of adhesions was performed. There was clearly a transition point at the level of the hernia defect. Following lysis of adhesions there was easy propulsion of succus through the small  bowel into the decompressed distal loops of small bowel. Viscera were then returned to the peritoneal cavity.  Skin flaps are elevated circumferentially. Hernia sac was excised and discarded. Fascial edges were reapproximated with interrupted #1 Novafil simple sutures. Relaxing incisions were not performed due to the relative attenuation of the abdominal wall. A 19 [place] Blake drain is brought in from a lateral stab wound and placed in the subcutaneous space to prevent seroma formation. It is secured to the skin with a 2-0 nylon suture. Subcutaneous tissues are closed with interrupted 2-0 Vicryl sutures. Skin is closed with stainless steel staples. Drain is placed to bulb suction.  Honeycomb dressing is applied to the incision. Drain sponges placed at the base of the drain. A new ostomy bag is placed over the ileostomy in the left lower quadrant. Patient is awakened from anesthesia and brought to the recovery room. The patient tolerated the procedure well.  Armandina Gemma, MD  [facility]  Office: [address] [place], [address] [id number]  OPERATIVE PROCEDURE REPORT - 03/07/2014  PATIENT: Emily, Holloway MR #: [id number]  BIRTHDATE: 07-Jan-1956 GENDER: Female  ATTENDING: Lenise Arena M.D. REFERRING MD:  ASSISTANT: Morganne Doepker and Garment/textile technologist GIT  PROCEDURE: 1. surveillance colonoscopy  INDICATIONS: 1. Screening, high risk, personal history of rectal cancer  MEDICATIONS: See Nurse Report. Fentanyl 100 mcg IV Versed 4 mg IV  DESCRIPTION OF PROCEDURE:  After the risks, benefits, and alternatives of the procedure were  thoroughly  explained, Informed consent was obtained. A digital rectal exam was performed  and revealed tenderness of the rectum. The [id number] endoscope was  introduced through the anus and advanced to the mid transverse colon. The prep  was poor. The instrument was then slowly withdrawn as the colon was fully  examined.  COLON FINDINGS: Non-bleeding mucosal ulceration, continuous across the area  examined, was present in the rectum. Multiple sessile polyps ranging between  3-16mm in size were found in the descending colon. Snare cautery Polypectomy  was performed. The resections were incomplete and the polyp tissue was  partially retrieved. There was evidence of a hyperemic appearing prior  surgical anastomosis in the rectum. Retroflexion not performed.. The scope  was then completely withdrawn from the patient and the procedure terminated.  LIMITATIONS: Without limitations  COMPLICATIONS: There were no complications.  ENDOSCOPIC IMPRESSION: 1. Non-bleeding mucosal ulceration in the rectum  2. Multiple sessile polyps ranging between 3-1mm in size were found in the  descending colon; Polypectomy was performed  3. There was evidence of prior surgical anastomosis in the rectum  4. Retroflexion not performed.  RECOMMENDATION: Await biopsy results  PATHOLOGY: Pathology - Biopsy Taken  REPEAT EXAM: Return in 3 year(s) for Colonoscopy.  CPT CODES: 09811 Colonoscopy, flexible, proximal to splenic flexure; with  ablation of tumor(s), polyp(s), or other lesion(s) not amenable to removal by  hot biopsy forceps, bipolar cautery or snare technique  ICD9 CODES: 1. V10.06 Personal history of malignant neoplasm of rectum,  rectosigmoid junction and anus  2. 569.41 Ulcer of rectum and anus  3. 211.3 Benign neoplasm of colon  4. V45.3 Postsurgical intestinal bypass or anastomosis status  __________________________________  Lenise Arena M.D.  eSigned: Lenise Arena M.D. 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  Diagnostic Studies History Adin Hector, MD; 09/13/2019 12:16 PM)  Colonoscopy 1-5 years ago  Mammogram 1-3 years ago  Allergies Adin Hector, MD; 09/13/2019 12:16 PM)  Sulfa Antibiotics  Allergies Reconciled  Medication History Andreas Blower, [place]; 09/13/2019 11:26 AM)  No Current Medications  Medications Reconciled  Social History Adin Hector, MD; 09/13/2019 12:16 PM)  Alcohol use Occasional alcohol use.  Caffeine use Coffee.  Illicit drug use Remotely quit drug use.  Tobacco use Former smoker.  Family History Adin Hector, MD; 09/13/2019 12:16 PM)  Cancer Brother, Father.  Colon Cancer Mother.  Diabetes Mellitus Brother, Father.  Hypertension Mother.  Thyroid problems Mother.  Pregnancy / Birth History Adin Hector, MD; 09/13/2019 12:16 PM)  Age at menarche 56 years.  Age of menopause 64-55  Gravida 0  Para 0  Other Problems Adin Hector, MD; 09/13/2019 12:16 PM)  Colon Cancer  Ulcerative Colitis  Vitals (Armen Ferguson CMA; 09/13/2019 11:26 AM)  09/13/2019 11:25 AM  Weight: 153.5 lb Height: 62 in  Body Surface Area: 1.71 m Body Mass Index: 28.08 kg/m  Temp.: 98.3 F Pulse: 90 (Regular) P.OX: 99% (Room air)  BP: 136/84 (Sitting, Left Arm, Standard)  Physical Exam Adin Hector MD; 09/13/2019 12:25 PM)  General  Mental Status - Alert.  General Appearance - Not in acute distress.  Voice - Normal.  Note: Relaxed.  Nontoxic.  Integumentary  Global Assessment  Upon inspection and palpation of skin surfaces of the - Distribution of scalp and body hair is normal.  General Characteristics  Overall examination of the patient's skin reveals - no rashes and no suspicious lesions.  Head and Neck  Head - normocephalic, atraumatic with no lesions or palpable masses.  Face  Global Assessment - atraumatic, no absence of expression.  Neck  Global Assessment - no abnormal movements, no decreased range of motion.  Trachea - midline.  Thyroid  Gland Characteristics - non-tender.  Eye  Eyeball - Left - Extraocular movements intact, No Nystagmus - Left.  Eyeball - Right - Extraocular movements intact, No Nystagmus - Right.  Upper Eyelid - Left - No Cyanotic - Left.  Upper Eyelid - Right - No Cyanotic - Right.  Chest and Lung Exam  Inspection  Accessory muscles - No use of accessory muscles in breathing.  Abdomen  Note: Large left lower quadrant parastomal hernia. Sensitive reducible. Some mucosal prolapse of her end ileostomy. He will keep his seal. Midline incision and right lower quadrant incisions closed without hernias No active bleeding. No cellulitis. No guarding/rebound tenderness  Female Genitourinary  Note: Introitus somewhat narrowed but no evidence of any recurrent rectovaginal fistula. Flat scar on posterior vaginal wall/introitus. No methylene blue staining after retention enema in introitus or rectovaginal septum. No drainage or abnormality. No mucus. No exposed staples. No evidence of fistula  Rectal  Note: Perianal skin clear. Normal sphincter tone.  Anal skin tag. Tolerance digital rectal exam to 3 cm mild coloanal stricturing but allows index finger more proximally. Some thinness on the mid rectovaginal septum but no fistula felt  Peripheral Vascular  Upper Extremity  Inspection - Left - Not Gangrenous, No Petechiae. Inspection - Right - Not Gangrenous, No Petechiae.  Neurologic  Neurologic  evaluation reveals - normal attention span and ability to concentrate, able to name objects and repeat phrases. Appropriate fund of knowledge and normal coordination.  Neuropsychiatric  Mental status exam performed with findings of - able to articulate well with normal speech/language, rate, volume and coherence and no evidence of hallucinations, delusions, obsessions or homicidal/suicidal ideation.  Orientation - oriented X3.  Musculoskeletal  Global Assessment  Gait and Station - normal gait and station.  Lymphatic  General Lymphatics  Description - No Generalized lymphadenopathy.  Assessment & Plan Adin Hector MD; 09/13/2019 12:36 PM)  PARA-ILEOSTOMY HERNIA (K94.19)  Impression: Moderately large hernia around left lower quadrant end ileostomy. Recent episode of incarceration at old incisional hernia. I think she needs something definitively done to prevent incarceration/strangulation there.  Ideally take down ileostomy. Ileum to ascending colon. Robotic lysis of adhesions. Intracorporeal anastomosis of end ileum to RLQ ascending colon. Increased risk of need to convert to open or hand-assisted. Ideally try to avoid any new incisions given her history of recurrent hernias  Primary repair of the hernia. Then can plan ventral hernia repairs with mesh on any potential recurrent hernias.  While she's had evidence of rectovaginal fistula in past, there is no evidence in the past 6 years. Nothing obvious on EUA 2014, physical exam 2020-2021. Nothing on MRI. No evidence with methylene blue enema/vaginal packing. She has tolerated self Hagar anorectal dilatations as well as pelvic floor physical therapy. She is tolerating retention enemas. She had abnormal defecation but ultimately was able to evacuate contrast gel on MRI defecography.  She has had no evidence of local recurrence 10 years out from her initial operation. She has had normal colonoscopies in 2015 & 2018, arguing against any major  stricturing at the coloanal anastomosis.. She has normal sphincter tone. She wants any surgery by me. I discussed with my colorectal partner, Dr. Dema Severin again. Would have colonoscopy available intraoperatively  Should she have intolerance with ostomy takedown, convert to a colostomy through left upper quadrant site to minimize recurrence and diarrhea issues.  PREOP COLON - ENCOUNTER FOR PREOPERATIVE EXAMINATION FOR GENERAL SURGICAL PROCEDURE (Z01.818)  Current Plans  You are being scheduled for surgery - Our schedulers will call you.  You should hear from our office's scheduling department within 5 working days about the location, date, and time of surgery. We try to make accommodations for patient's preferences in scheduling surgery, but sometimes the OR schedule or the surgeon's schedule prevents [place] from making those accommodations.  If you have not heard from our office [phone]) in 5 working days, call the office and ask for your surgeon's nurse.  If you have other questions about your diagnosis, plan, or surgery, call the office and ask for your surgeon's nurse.  The anatomy & physiology of the digestive tract was discussed. The pathophysiology was discussed. Possibility of remaining with an ostomy permanently was discussed. I offered ostomy takedown. Laparoscopic & open techniques were discussed.  Risks such as bleeding, infection, abscess, leak, reoperation, possible re-ostomy, injury to other organs, hernia, heart attack, death, and other risks were discussed. I noted a good likelihood this will help address the problem. Goals of post-operative recovery were discussed as well. We will work to minimize complications. Questions were answered. The patient expresses understanding & wishes to proceed with surgery.  Written instructions provided  Pt Education - Pamphlet Given - Laparoscopic Colorectal Surgery: discussed with patient and provided information.  Pt Education - CCS Colectomy post-op  instructions: discussed with patient and provided information.  ANAL STRICTURE (K62.4)  Impression: Improved with pelvic floor physical therapy and self dilatations. No evidence of recurrent fistula. If + on intraop EUA/coloonscopy, then switch to end colostomy to LUQ (hopefully unlikely)  Current Plans  Pt Education - CCS Pelvic Floor Exercises (Kegels) and Dysfunction HCI (Gailene Youkhana)  Referred to Physical Therapy, for evaluation and follow up (Physical Therapy).  INCISIONAL HERNIA, INCARCERATED (K43.0)  Impression: Recovering status post primary repair of incarcerated hernia causing bowel obstruction. She is a moderate risk of hernia recurrence. If that happens, would recommend mesh repair. We will see.  RECTAL ADENOCARCINOMA (C20)  Story: DIAGNOSIS: Initial stage I rectal adenocarcinoma (T2N0M0)  Biopsy proven lung metastasis, s/p wedge resection  TREATMENT:  1. Underwent LAR in Nov 2010 with temp loop ileostomy  2. Ileostomy reversal Jan 2011  3. 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  4. 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 Underwent thoracoscopic wedge resection of right upper lobe on 05/30/13  6. Begin pseudo-adjuvant chemotherapy with single agent Xeloda (~1000mg /m^2 BID) 07/05/13. Complicated with grade 3 hand foot syndrome on 08/19/2013 3rd day of cycle 3. Dose reduced 50% when resumed 09/22/2013. Completed 4 cycles (3 months of treatment) on 10/20/2013.  Adin Hector, MD, FACS, MASCRS  Gastrointestinal and Minimally Invasive Surgery  [facility] Surgery  [address] [place]  [place], [place] [address]  [phone] Main / Paging  [phone] Fax

## 2019-11-23 NOTE — Transfer of Care (Signed)
Immediate Anesthesia Transfer of Care Note  Patient: Emily Holloway  Procedure(s) Performed: Procedure(s): XI ROBOTIC END ILEOSTOMY TAKEDOWN, INTRACORPOREAL ANASTOMOSIS, OMENTOPEXY, BILATERAL TAP BLOCK (N/A) COLONOSCOPY (N/A) PARASTOMAL HERNIA REPAIR (N/A) LYSIS OF ADHESIONS X3 HOURS (N/A)  Patient Location: PACU  Anesthesia Type:General  Level of Consciousness: Alert, Awake, Oriented  Airway & Oxygen Therapy: Patient Spontanous Breathing  Post-op Assessment: Report given to RN  Post vital signs: Reviewed and stable  Last Vitals:  Vitals:   11/23/19 0637  BP: 117/82  Pulse: 94  Resp: 14  Temp: (!) 36.3 C  SpO2: [id number]    Complications: No apparent anesthesia complications

## 2019-11-23 NOTE — Interval H&P Note (Signed)
History and Physical Interval Note:  11/23/2019 7:45 AM  Emily Holloway  has presented today for surgery, with the diagnosis of PARASTOMAL HERNIA  AT END ILEOSTOMY.  The various methods of treatment have been discussed with the patient and family. After consideration of risks, benefits and other options for treatment, the patient has consented to  Procedure(s): XI ROBOTIC END ILEOSTOMY TAKEDOWN, INTRACORPOREAL ANASTOMOSIS (N/A) COLONOSCOPY (N/A) PARASTOMAL HERNIA REPAIR (N/A) LYSIS OF ADHESION (N/A) as a surgical intervention.  The patient's history has been reviewed, patient examined, no change in status, stable for surgery.  I have reviewed the patient's chart and labs.  Questions were answered to the patient's satisfaction.    Pt underwent pelvic floor PT MRI no cancer/fistula Office EUA no fistula/stricture Colonoscopy no CA Ready for surgery  Adin Hector

## 2019-11-23 NOTE — Op Note (Signed)
11/23/2019  3:05 PM  PATIENT:  Emily Holloway  64 y.o. female  Patient Care Team: Carol Ada, MD as PCP - General (Family Medicine) Gatha Mayer, MD as Consulting Physician (Gastroenterology) Michael Boston, MD (General Surgery) Bjorn Loser, MD as Consulting Physician (Urology) Karle Starch, MD as Consulting Physician (Medical Oncology) Cheryll Cockayne, MD as Consulting Physician (Colon and Rectal Surgery) Aviva Signs, MD as Consulting Physician (Gastroenterology)  PRE-OPERATIVE DIAGNOSIS:  History of ileostomy and rectal cancer  POST-OPERATIVE DIAGNOSIS:  Same  PROCEDURE:  Diagnostic colonoscopy  SURGEON:  Sharon Mt. Rodricus Candelaria, MD  ANESTHESIA:   general  EBL: 0 mL  SPECIMEN: None  COMPLICATIONS: None  FINDINGS: I was the assist for the main procedure and colonoscopy was requested to confirm no significant luminal masses or visible anastomotic issues. Patent coloanal anastomosis without evident fistula. 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 Entire colon coated in layer of grey barium/stool consistency of clay. No large intraluminal masses. Anastomosis approached but not crossed. This was hemostatic, widely patent, pink and as noted by Dr. Johney Maine, airtight.  DESCRIPTION: After completion of the ileocolic anastomosis, the ileum proximal to the anastomosis had been clamped by Dr. Johney Maine. A well lubricated digital rectal exam was performed and demonstrated a patent coloanal anastomosis without significant stricture or palpable masses. There was a nice pad of tissue between the coloanal anastomosis and the introitus of the vagina. The colonoscope was then introduced into the anus and under direct visualization, carefully advanced to the level of the ileocolic anastomosis. The colon was nicely distended. The anastomosis was clearly widely patent and hemostatic. With the anastomosis submerged by  Dr. Johney Maine, no extravasation of bubbles were seen. The colonoscope was then slowly withdrawn and the colon was circumferentially inspected. The colon was not able to be prepped previously due to anatomy. The wall was coated in old stool/barium, however, no large or partially obstructing luminal masses were noted. This was thick enough that it could not be cleared with the scope. The distal half of the colon exhibited limited mucosal views but findings consistent with mild diversion colitis. Retroflexion was not attempted as she previously had undergone low anterior resection. The case was then resumed by Dr. Johney Maine.

## 2019-11-23 NOTE — Anesthesia Postprocedure Evaluation (Signed)
Anesthesia Post Note  Patient: Emily Holloway  Procedure(s) Performed: XI ROBOTIC END ILEOSTOMY TAKEDOWN, INTRACORPOREAL ANASTOMOSIS, OMENTOPEXY, BILATERAL TAP BLOCK (N/A Abdomen) COLONOSCOPY (N/A Rectum) PARASTOMAL HERNIA REPAIR (N/A Abdomen) LYSIS OF ADHESIONS X3 HOURS (N/A Abdomen)     Patient location during evaluation: PACU Anesthesia Type: General Level of consciousness: awake and alert Pain management: pain level controlled Vital Signs Assessment: post-procedure vital signs reviewed and stable Respiratory status: spontaneous breathing, nonlabored ventilation, respiratory function stable and patient connected to nasal cannula oxygen Cardiovascular status: blood pressure returned to baseline and stable Postop Assessment: no apparent nausea or vomiting Anesthetic complications: no    Last Vitals:  Vitals:   11/23/19 1605 11/23/19 1615  BP: 95/62 104/62  Pulse: 79 89  Resp: 14 15  Temp:    SpO2: 100% 100%    Last Pain:  Vitals:   11/23/19 1615  TempSrc:   PainSc: Asleep                 Lisa Milian S

## 2019-11-24 LAB — BASIC METABOLIC PANEL
Anion gap: 10 (ref 5–15)
BUN: 9 mg/dL (ref 8–23)
CO2: 23 mmol/L (ref 22–32)
Calcium: 8.4 mg/dL — ABNORMAL LOW (ref 8.9–10.3)
Chloride: 106 mmol/L (ref 98–111)
Creatinine, Ser: 0.77 mg/dL (ref 0.44–1.00)
GFR calc Af Amer: >60 mL/min (ref >60)
GFR calc non Af Amer: >60 mL/min (ref >60)
Glucose, Bld: 139 mg/dL — ABNORMAL HIGH (ref 70–99)
Potassium: 3.8 mmol/L (ref 3.5–5.1)
Sodium: 139 mmol/L (ref 135–145)

## 2019-11-24 LAB — CBC
HCT: 37.8 % (ref 36.0–46.0)
Hemoglobin: 11.9 g/dL — ABNORMAL LOW (ref 12.0–15.0)
MCH: 31.1 pg (ref 26.0–34.0)
MCHC: 31.5 g/dL (ref 30.0–36.0)
MCV: 98.7 fL (ref 80.0–100.0)
Platelets: 220 10*3/uL (ref 150–400)
RBC: 3.83 MIL/uL — ABNORMAL LOW (ref 3.87–5.11)
RDW: 13.3 % (ref 11.5–15.5)
WBC: 14.6 10*3/uL — ABNORMAL HIGH (ref 4.0–10.5)
nRBC: 0 % (ref 0.0–0.2)

## 2019-11-24 LAB — MAGNESIUM: Magnesium: 1.4 mg/dL — ABNORMAL LOW (ref 1.7–2.4)

## 2019-11-24 MED ORDER — SODIUM CHLORIDE 0.9% FLUSH: 3.0000 mL | INTRAVENOUS | Status: DC | PRN

## 2019-11-24 MED ORDER — SODIUM CHLORIDE 0.9 % IV SOLN
250.0000 mL | INTRAVENOUS | Status: DC | PRN
  Administered 2019-12-01: 250 mL via INTRAVENOUS

## 2019-11-24 MED ORDER — LACTATED RINGERS IV BOLUS
1000.0000 mL | Freq: Three times a day (TID) | INTRAVENOUS | Status: DC | PRN
  Administered 2019-11-24: 14:00:00 1000 mL via INTRAVENOUS

## 2019-11-24 MED ORDER — FAMOTIDINE 20 MG PO TABS
20.0000 mg | ORAL_TABLET | Freq: Two times a day (BID) | ORAL | Status: DC
  Administered 2019-11-24 (×2): 20 mg via ORAL
  Filled 2019-11-24 (×2): qty 1

## 2019-11-24 MED ORDER — MAGNESIUM SULFATE 4 GM/100ML IV SOLN
4.0000 g | Freq: Once | INTRAVENOUS | Status: AC
  Administered 2019-11-24: 4 g via INTRAVENOUS
  Filled 2019-11-24: qty 100

## 2019-11-24 MED ORDER — SODIUM CHLORIDE 0.9% FLUSH
3.0000 mL | Freq: Two times a day (BID) | INTRAVENOUS | Status: DC
  Administered 2019-11-24 – 2019-12-04 (×16): 3 mL via INTRAVENOUS

## 2019-11-24 NOTE — Progress Notes (Signed)
On call  MD notified of pt's pulse between 94-111 with a high of 121. Will continue to monitor HR and await results of AM labs.

## 2019-11-24 NOTE — Progress Notes (Addendum)
Quinette Christoffer MR:[id number] 1956-05-31  CARE TEAM:  PCP: Carol Ada, MD  Outpatient Care Team: Patient Care Team: Carol Ada, MD as PCP - General (Family Medicine) Gatha Mayer, MD as Consulting Physician (Gastroenterology) Michael Boston, MD (General Surgery) Bjorn Loser, MD as Consulting Physician (Urology) Karle Starch, MD as Consulting Physician (Medical Oncology) Cheryll Cockayne, MD as Consulting Physician (Colon and Rectal Surgery) Aviva Signs, MD as Consulting Physician (Gastroenterology)  Inpatient Treatment Team: Treatment Team: Attending Provider: Michael Boston, MD; Registered Nurse: Jerrol Banana, RN; Technician: Earley Abide, NT; Utilization Review: Tressie Stalker, RN; Technician: Berenice Bouton, NT   Problem List:   Active Problems:   Parastomal hernia   1 Day Post-Op  11/23/2019  Procedure(s): XI ROBOTIC END ILEOSTOMY TAKEDOWN, INTRACORPOREAL ANASTOMOSIS, OMENTOPEXY, BILATERAL TAP BLOCK COLONOSCOPY PARASTOMAL HERNIA REPAIR LYSIS OF ADHESIONS X3 HOURS    Assessment  POD1 s/p end ileostomy takedown Parastomal hernia  [facility] Stay = 1 days)  Plan: Patient with HR between 90-121 overnight and into this morning. We will continue to monitor. Encourage mobilization.       -Replace Magnesium -Advance diet per protocol -Pain control -discontinue IVF -VTE prophylaxis- SCDs, etc -mobilize as tolerated to help recovery  20 minutes spent in review, evaluation, examination, counseling, and coordination of care.  More than 50% of that time was spent in counseling.  11/24/2019    Subjective: Patient overall feeling well, states that her pain is "what was expected". Having some pain, controlled with oral Dilaudid at this time. Foley catheter removed this morning, patient has not yet urinated No bowel movements or flatus yet Patient has not been up walking yet  Objective:  Vital signs:  Vitals:   11/24/19  0144 11/24/19 0536 11/24/19 0543 11/24/19 0700  BP: 116/70  (!) 109/59   Pulse: 100  98   Resp: 15  16   Temp: 98.7 F (37.1 C)  97.6 F (36.4 C)   TempSrc: Oral  Oral   SpO2: 100%  100%   Weight:  71.8 kg  74.5 kg  Height:        Last BM Date: 11/22/19  Intake/Output   Yesterday:  04/28 0701 - 04/29 0700 In: 3391.7 [I.V.:3091.7; IV Piggyback:300] Out: 950 [Urine:550; Blood:400] This shift:  No intake/output data recorded.  Bowel function:  Flatus: No  BM:  No  Drain: (No drain)   Physical Exam:  General: Pt awake/alert in no acute distress Eyes: PERRL, normal EOM.  Sclera clear.  No icterus Neuro: CN II-XII intact w/o focal sensory/motor deficits. Lymph: No head/neck/groin lymphadenopathy Psych:  No delerium/psychosis/paranoia.  Oriented x 4 HENT: Normocephalic, Mucus membranes moist.  No thrush Neck: Supple, No tracheal deviation.  No obvious thyromegaly Chest: No pain to chest wall compression.  Good respiratory excursion.  No audible wheezing CV:  Pulses intact.  Regular rhythm.  No major extremity edema MS: Normal AROM mjr joints.  No obvious deformity  Abdomen: Soft.  Mildy distended.  Mildly tender at incisions only.  No evidence of peritonitis.  No incarcerated hernias.  Ext:   No deformity.  No mjr edema.  No cyanosis Skin: No petechiae / purpurea.  No major sores.  Warm and dry    Results:   Cultures: Recent Results (from the past 720 hour(s))  SARS CORONAVIRUS 2 (TAT 6-24 HRS) Nasopharyngeal Nasopharyngeal Swab     Status: None   Collection Time: 11/19/19  1:32 PM   Specimen: Nasopharyngeal Swab  Result Value Ref Range  Status   SARS Coronavirus 2 NEGATIVE NEGATIVE Final    Comment: (NOTE) SARS-CoV-2 target nucleic acids are NOT DETECTED. The SARS-CoV-2 RNA is generally detectable in upper and lower respiratory specimens during the acute phase of infection. Negative results do not preclude SARS-CoV-2 infection, do not rule out co-infections  with other pathogens, and should not be used as the sole basis for treatment or other patient management decisions. Negative results must be combined with clinical observations, patient history, and epidemiological information. The expected result is Negative. Fact Sheet for Patients: SugarRoll.be Fact Sheet for Healthcare Providers: https://www.woods-mathews.com/ This test is not yet approved or cleared by the [place] FDA and  has been authorized for detection and/or diagnosis of SARS-CoV-2 by FDA under an Emergency Use Authorization (EUA). This EUA will remain  in effect (meaning this test can be used) for the duration of the COVID-19 declaration under Section 56 4(b)(1) of the Act, 21 U.S.C. section 360bbb-3(b)(1), unless the authorization is terminated or revoked sooner. Performed at [facility], [place] [address], [place], [place] [address]     Labs: Results for orders placed or performed during the hospital encounter of 11/23/19 (from the past 48 hour(s))  Basic metabolic panel     Status: Abnormal   Collection Time: 11/24/19  4:26 AM  Result Value Ref Range   Sodium 139 135 - 145 mmol/L   Potassium 3.8 3.5 - 5.1 mmol/L   Chloride 106 98 - 111 mmol/L   CO2 23 22 - 32 mmol/L   Glucose, Bld 139 (H) 70 - 99 mg/dL    Comment: Glucose reference range applies only to samples taken after fasting for at least 8 hours.   BUN 9 8 - 23 mg/dL   Creatinine, Ser 0.77 0.44 - 1.00 mg/dL   Calcium 8.4 (L) 8.9 - 10.3 mg/dL   GFR calc non Af Amer >60 >60 mL/min   GFR calc Af Amer >60 >60 mL/min   Anion gap 10 5 - 15    Comment: Performed at [facility], [place] [address] [place], [place] [address]  CBC     Status: Abnormal   Collection Time: 11/24/19  4:26 AM  Result Value Ref Range   WBC 14.6 (H) 4.0 - 10.5 K/uL   RBC 3.83 (L) 3.87 - 5.11 MIL/uL   Hemoglobin 11.9 (L) 12.0 - 15.0 g/dL   HCT 37.8 36.0 - 46.0 %   MCV  98.7 80.0 - 100.0 fL   MCH 31.1 26.0 - 34.0 pg   MCHC 31.5 30.0 - 36.0 g/dL   RDW 13.3 11.5 - 15.5 %   Platelets 220 150 - 400 K/uL   nRBC 0.0 0.0 - 0.2 %    Comment: Performed at [facility], [place] [address], [place], [place] [address]  Magnesium     Status: Abnormal   Collection Time: 11/24/19  4:26 AM  Result Value Ref Range   Magnesium 1.4 (L) 1.7 - 2.4 mg/dL    Comment: Performed at [facility], [place] [address] [place], [place] [address]    Imaging / Studies: No results found.  Medications / Allergies: per chart  Antibiotics: Anti-infectives (From admission, onward)   Start     Dose/Rate Route Frequency Ordered Stop   11/24/19 0200  cefoTEtan (CEFOTAN) 2 g in sodium chloride 0.9 % 100 mL IVPB     2 g 200 mL/hr over 30 Minutes Intravenous Every 12 hours 11/23/19 1646 11/24/19 0148  11/23/19 0645  cefoTEtan (CEFOTAN) 2 g in sodium chloride 0.9 % 100 mL IVPB  Status:  Discontinued     2 g 200 mL/hr over 30 Minutes Intravenous On call to O.R. 11/23/19 [id number] 11/23/19 1646        Note: Portions of this report may have been transcribed using voice recognition software. Every effort was made to ensure accuracy; however, inadvertent computerized transcription errors may be present.   Any transcriptional errors that result from this process are unintentional.     Orbie Pyo, PA-S Patient was seen and evaluated in coordination with Dr.Steven Johney Maine, MD  Adin Hector, MD, FACS, MASCRS Gastrointestinal and Minimally Invasive Surgery    [address], [place] [employer] [place], [place] [address] [phone] Main / Paging [phone] Fax Please see Amion for pager number, especial 5pm - 7am.

## 2019-11-25 LAB — SURGICAL PATHOLOGY

## 2019-11-25 LAB — CBC
HCT: 32.5 % — ABNORMAL LOW (ref 36.0–46.0)
Hemoglobin: 10.6 g/dL — ABNORMAL LOW (ref 12.0–15.0)
MCH: 31.4 pg (ref 26.0–34.0)
MCHC: 32.6 g/dL (ref 30.0–36.0)
MCV: 96.2 fL (ref 80.0–100.0)
Platelets: 188 10*3/uL (ref 150–400)
RBC: 3.38 MIL/uL — ABNORMAL LOW (ref 3.87–5.11)
RDW: 13.6 % (ref 11.5–15.5)
WBC: 16.3 10*3/uL — ABNORMAL HIGH (ref 4.0–10.5)
nRBC: 0 % (ref 0.0–0.2)

## 2019-11-25 LAB — BASIC METABOLIC PANEL
Anion gap: 9 (ref 5–15)
BUN: 8 mg/dL (ref 8–23)
CO2: 28 mmol/L (ref 22–32)
Calcium: 8.8 mg/dL — ABNORMAL LOW (ref 8.9–10.3)
Chloride: 101 mmol/L (ref 98–111)
Creatinine, Ser: 0.62 mg/dL (ref 0.44–1.00)
GFR calc Af Amer: >60 mL/min (ref >60)
GFR calc non Af Amer: >60 mL/min (ref >60)
Glucose, Bld: 116 mg/dL — ABNORMAL HIGH (ref 70–99)
Potassium: 3.7 mmol/L (ref 3.5–5.1)
Sodium: 138 mmol/L (ref 135–145)

## 2019-11-25 LAB — MAGNESIUM: Magnesium: 2 mg/dL (ref 1.7–2.4)

## 2019-11-25 MED ORDER — METOPROLOL TARTRATE 12.5 MG HALF TABLET
12.5000 mg | ORAL_TABLET | Freq: Two times a day (BID) | ORAL | Status: DC
  Administered 2019-11-25 – 2019-11-29 (×10): 12.5 mg via ORAL
  Filled 2019-11-25 (×10): qty 1

## 2019-11-25 MED ORDER — ONDANSETRON HCL 4 MG/2ML IJ SOLN
4.0000 mg | Freq: Four times a day (QID) | INTRAMUSCULAR | Status: DC
  Administered 2019-11-25 – 2019-11-27 (×8): 4 mg via INTRAVENOUS
  Filled 2019-11-25 (×7): qty 2

## 2019-11-25 MED ORDER — LOPERAMIDE HCL 2 MG PO CAPS: 2.0000 mg | ORAL_CAPSULE | Freq: Three times a day (TID) | ORAL | Status: DC | PRN

## 2019-11-25 MED ORDER — LACTATED RINGERS IV BOLUS: 1000.0000 mL | Freq: Once | INTRAVENOUS | Status: DC

## 2019-11-25 MED ORDER — ALBUMIN HUMAN 5 % IV SOLN
12.5000 g | Freq: Once | INTRAVENOUS | Status: AC
  Administered 2019-11-25: 12.5 g via INTRAVENOUS
  Filled 2019-11-25: qty 250

## 2019-11-25 MED ORDER — METHOCARBAMOL 1000 MG/10ML IJ SOLN
500.0000 mg | Freq: Three times a day (TID) | INTRAVENOUS | Status: DC
  Administered 2019-11-25 – 2019-11-27 (×5): 500 mg via INTRAVENOUS
  Filled 2019-11-25 (×3): qty 500
  Filled 2019-11-25 (×3): qty 5

## 2019-11-25 MED ORDER — ALBUMIN HUMAN 5 % IV SOLN
12.5000 g | Freq: Four times a day (QID) | INTRAVENOUS | Status: AC | PRN
  Filled 2019-11-25: qty 250

## 2019-11-25 NOTE — Evaluation (Signed)
Occupational Therapy Evaluation Patient Details Name: Emily Holloway MRN: [id number] DOB: [birth year] Today's Date: 11/25/2019    History of Present Illness Pt presents with Parastomal hernia- s/p end ileostomy takedown, parastomal hernia repair   Clinical Impression   Pt admitted with the above. Pt currently with functional limitations due to the deficits listed below (see OT Problem List).  Pt will benefit from skilled OT to increase their safety and independence with ADL and functional mobility for ADL to facilitate discharge to venue listed below.   Pt will have A at home as needed.  Pt in good spirits. Pt works as an Optometrist at [employer]     Follow Up Recommendations  Supervision/Assistance - 24 hour;No OT follow up    Equipment Recommendations  None recommended by OT    Recommendations for Other Services       Precautions / Restrictions Precautions Precautions: Fall      Mobility Bed Mobility               General bed mobility comments: pt in chair  Transfers Overall transfer level: Needs assistance Equipment used: Rolling walker (2 wheeled) Transfers: Sit to/from Stand Sit to Stand: Min assist              Balance Overall balance assessment: Mild deficits observed, not formally tested                                         ADL either performed or assessed with clinical judgement   ADL Overall ADL's : Needs assistance/impaired Eating/Feeding: Set up;Sitting   Grooming: Set up;Sitting   Upper Body Bathing: Set up;Sitting   Lower Body Bathing: Moderate assistance   Upper Body Dressing : Set up;Sitting   Lower Body Dressing: Moderate assistance;Sit to/from stand   Toilet Transfer: Minimal assistance   Toileting- Clothing Manipulation and Hygiene: Minimal assistance;Sit to/from stand;Cueing for safety;Cueing for compensatory techniques       Functional mobility during ADLs: Min guard;Rolling  walker;Minimal assistance       Vision Baseline Vision/History: No visual deficits              Pertinent Vitals/Pain Pain Assessment: 0-10 Pain Score: 2      Hand Dominance     Extremity/Trunk Assessment Upper Extremity Assessment Upper Extremity Assessment: Generalized weakness           Communication Communication Communication: No difficulties   Cognition Arousal/Alertness: Awake/alert Behavior During Therapy: WFL for tasks assessed/performed Overall Cognitive Status: Within Functional Limits for tasks assessed                                                Home Living Family/patient expects to be discharged to:: Private residence Living Arrangements: Alone   Type of Home: House             Bathroom Shower/Tub: Hospital doctor Toilet: Handicapped height     Home Equipment: Hand held shower head;Shower seat;Grab bars - tub/shower;Adaptive equipment          Prior Functioning/Environment Level of Independence: Independent                 OT Problem List: Decreased strength;Decreased activity tolerance;Obesity      OT Treatment/Interventions: Self-care/ADL  training;DME and/or AE instruction;Patient/family education    OT Goals(Current goals can be found in the care plan section) Acute Rehab OT Goals Patient Stated Goal: get well OT Goal Formulation: With patient Time For Goal Achievement: 12/03/19 Potential to Achieve Goals: Good  OT Frequency: Min 2X/week    AM-PAC OT "6 Clicks" Daily Activity     Outcome Measure Help from another person eating meals?: None Help from another person taking care of personal grooming?: None Help from another person toileting, which includes using toliet, bedpan, or urinal?: A Little Help from another person bathing (including washing, rinsing, drying)?: A Little Help from another person to put on and taking off regular upper body clothing?: None Help from another person to  put on and taking off regular lower body clothing?: A Little 6 Click Score: 21   End of Session Equipment Utilized During Treatment: Rolling walker Nurse Communication: Mobility status  Activity Tolerance: Patient tolerated treatment well Patient left: in chair;with call bell/phone within reach  OT Visit Diagnosis: Unsteadiness on feet (R26.81);Muscle weakness (generalized) (M62.81)                Time: 1015-1030 OT Time Calculation (min): 15 min Charges:  OT General Charges $OT Visit: 1 Visit OT Evaluation $OT Eval Moderate Complexity: 1 Mod  Kari Baars, [place] [phone] Office- [phone], Edwena Felty D 11/25/2019, 12:43 PM

## 2019-11-25 NOTE — Progress Notes (Addendum)
[name] [id number] 12-15-1955  CARE TEAM:  PCP: Carol Ada, MD  Outpatient Care Team: Patient Care Team: Carol Ada, MD as PCP - General (Family Medicine) Gatha Mayer, MD as Consulting Physician (Gastroenterology) Michael Boston, MD (General Surgery) Bjorn Loser, MD as Consulting Physician (Urology) Karle Starch, MD as Consulting Physician (Medical Oncology) Cheryll Cockayne, MD as Consulting Physician (Colon and Rectal Surgery) Aviva Signs, MD as Consulting Physician (Gastroenterology)  Inpatient Treatment Team: Treatment Team: Attending Provider: Michael Boston, MD; Registered Nurse: Jerrol Banana, RN; Technician: Earley Abide, NT; Technician: Berenice Bouton, NT; Occupational Therapist: Betsy Pries, OT; Utilization Review: Tressie Stalker, RN; Physical Therapist: Neil Crouch, PT   Problem List:   Active Problems:   Parastomal hernia   2 Days Post-Op  11/23/2019  Procedure(s): XI ROBOTIC END ILEOSTOMY TAKEDOWN, INTRACORPOREAL ANASTOMOSIS, OMENTOPEXY, BILATERAL TAP BLOCK COLONOSCOPY PARASTOMAL HERNIA REPAIR LYSIS OF ADHESIONS X3 HOURS    Assessment  Parastomal hernia-POD2 s/p end ileostomy takedown, parastomal hernia repair  [facility] Stay = 2 days)  Plan:  -Continued tachycardia but not hypotensive. Continue monitoring. EKG not indicated at this point.  -PT/OT evaluation and support with patient mobilization. We appreciate their support. -Pain and nausea control -Advance diet per protocol -Albumin IV  -VTE prophylaxis- SCDs, etc -mobilize as tolerated to help recovery  30 minutes spent in review, evaluation, examination, counseling, and coordination of care.  More than 50% of that time was spent in counseling.  D/C patient from hospital when patient meets criteria (anticipate in 3-5 day(s)):  Tolerating oral intake well Ambulating well Adequate pain control without IV medications Urinating   Having flatus Disposition planning in place  11/25/2019    Subjective:  Patient feeling nauseated and having pain, denies vomiting. She has been taking Tylenol only for pain control. Has not eaten very much since yesterday. Belching but denies heartburn.  Has not been up walking.  Has not passed flatus or moved bowels.   Objective:  Vital signs:  Vitals:   11/24/19 2119 11/25/19 0142 11/25/19 0512 11/25/19 0812  BP: 117/70 123/74 115/77 111/68  Pulse: (!) 112 (!) 121 (!) 116 (!) 115  Resp: 16 16 16    Temp: 98 F (36.7 C) (!) 97.2 F (36.2 C) 98.2 F (36.8 C)   TempSrc: Oral Oral Oral   SpO2: 99% 100% 97%   Weight:   75.1 kg   Height:        Last BM Date: 11/22/19  Intake/Output   Yesterday:  04/29 0701 - 04/30 0700 In: 2215 [P.O.:840; IV Piggyback:1375] Out: 1300 [Urine:1300] This shift:  Total I/O In: -  Out: 200 [Urine:200]  Bowel function:  Flatus: No  BM:  No  Drain: (No drain)   Physical Exam:  General: Pt awake/alert in mild acute distress Eyes: PERRL, normal EOM.  Sclera clear.  No icterus Neuro: CN II-XII intact w/o focal sensory/motor deficits. Lymph: No head/neck/groin lymphadenopathy Psych:  No delerium/psychosis/paranoia.  Oriented x 4 HENT: Normocephalic, Mucus membranes moist.  No thrush Neck: Supple, No tracheal deviation.  No obvious thyromegaly Chest: No pain to chest wall compression.  Good respiratory excursion.  No audible wheezing CV:  Pulses intact.  Regular rhythm.  No major extremity edema MS: Normal AROM mjr joints.  No obvious deformity  Abdomen: Soft.  Mildy distended.   Diffusely tender, L>R .  No evidence of peritonitis.  No incarcerated hernias.  Ext:  Incisions clean/dry/intact. No deformity.  No mjr edema.  No cyanosis  Skin: No petechiae / purpurea.  No major sores.  Warm and dry    Results:   Cultures: Recent Results (from the past 720 hour(s))  SARS CORONAVIRUS 2 (TAT 6-24 HRS) Nasopharyngeal Nasopharyngeal  Swab     Status: None   Collection Time: 11/19/19  1:32 PM   Specimen: Nasopharyngeal Swab  Result Value Ref Range Status   SARS Coronavirus 2 NEGATIVE NEGATIVE Final    Comment: (NOTE) SARS-CoV-2 target nucleic acids are NOT DETECTED. The SARS-CoV-2 RNA is generally detectable in upper and lower respiratory specimens during the acute phase of infection. Negative results do not preclude SARS-CoV-2 infection, do not rule out co-infections with other pathogens, and should not be used as the sole basis for treatment or other patient management decisions. Negative results must be combined with clinical observations, patient history, and epidemiological information. The expected result is Negative. Fact Sheet for Patients: SugarRoll.be Fact Sheet for Healthcare Providers: https://www.woods-mathews.com/ This test is not yet approved or cleared by the [place] FDA and  has been authorized for detection and/or diagnosis of SARS-CoV-2 by FDA under an Emergency Use Authorization (EUA). This EUA will remain  in effect (meaning this test can be used) for the duration of the COVID-19 declaration under Section 56 4(b)(1) of the Act, 21 U.S.C. section 360bbb-3(b)(1), unless the authorization is terminated or revoked sooner. Performed at [facility], [place] [address], [place], [place] [address]     Labs: Results for orders placed or performed during the hospital encounter of 11/23/19 (from the past 48 hour(s))  Basic metabolic panel     Status: Abnormal   Collection Time: 11/24/19  4:26 AM  Result Value Ref Range   Sodium 139 135 - 145 mmol/L   Potassium 3.8 3.5 - 5.1 mmol/L   Chloride 106 98 - 111 mmol/L   CO2 23 22 - 32 mmol/L   Glucose, Bld 139 (H) 70 - 99 mg/dL    Comment: Glucose reference range applies only to samples taken after fasting for at least 8 hours.   BUN 9 8 - 23 mg/dL   Creatinine, Ser 0.77 0.44 - 1.00 mg/dL   Calcium 8.4  (L) 8.9 - 10.3 mg/dL   GFR calc non Af Amer >60 >60 mL/min   GFR calc Af Amer >60 >60 mL/min   Anion gap 10 5 - 15    Comment: Performed at [facility], [place] [address] [place], [place] [address]  CBC     Status: Abnormal   Collection Time: 11/24/19  4:26 AM  Result Value Ref Range   WBC 14.6 (H) 4.0 - 10.5 K/uL   RBC 3.83 (L) 3.87 - 5.11 MIL/uL   Hemoglobin 11.9 (L) 12.0 - 15.0 g/dL   HCT 37.8 36.0 - 46.0 %   MCV 98.7 80.0 - 100.0 fL   MCH 31.1 26.0 - 34.0 pg   MCHC 31.5 30.0 - 36.0 g/dL   RDW 13.3 11.5 - 15.5 %   Platelets 220 150 - 400 K/uL   nRBC 0.0 0.0 - 0.2 %    Comment: Performed at [facility], [place] [address], [place], [place] [address]  Magnesium     Status: Abnormal   Collection Time: 11/24/19  4:26 AM  Result Value Ref Range   Magnesium 1.4 (L) 1.7 - 2.4 mg/dL    Comment: Performed at [facility], [place] [address], [place], [place] [address]    Imaging / Studies: No results found.  Medications / Allergies: per chart  Antibiotics: Anti-infectives (From admission, onward)    Start     Dose/Rate Route Frequency Ordered Stop   11/24/19 0200  cefoTEtan (CEFOTAN) 2 g in sodium chloride 0.9 % 100 mL IVPB     2 g 200 mL/hr over 30 Minutes Intravenous Every 12 hours 11/23/19 1646 11/24/19 0148   11/23/19 0645  cefoTEtan (CEFOTAN) 2 g in sodium chloride 0.9 % 100 mL IVPB  Status:  Discontinued     2 g 200 mL/hr over 30 Minutes Intravenous On call to O.R. 11/23/19 [id number] 11/23/19 1646         Note: Portions of this report may have been transcribed using voice recognition software. Every effort was made to ensure accuracy; however, inadvertent computerized transcription errors may be present.   Any transcriptional errors that result from this process are unintentional.     Emily Pyo, PA-S Patient was seen and evaluated in coordination with Dr.Steven Gross, MD     [address] [place]  [place], [place] [address] [phone] Main / Paging [phone] Fax Please see Amion for pager number, especial 5pm - 7am.

## 2019-11-25 NOTE — Evaluation (Addendum)
Physical Therapy Evaluation Patient Details Name: Emily Holloway MRN: [id number] DOB: 05-05-1956 Today's Date: 11/25/2019   History of Present Illness  Pt presents with Parastomal hernia- s/p end ileostomy takedown, parastomal hernia repair  Clinical Impression  Pt admitted with above diagnosis.  Pt amb ~ 67' with RW and min assist. Requiring mod assist for bed mobility. Will benefit from continued PT ina cute setting and may need HHPT upon d/c  amb on RA and pt with SpO2 79-80%, replaced O2, encouraged pursed lip breathing and use of IS, recovered to 91% on 2L in ~ 1 minute   Pt currently with functional limitations due to the deficits listed below (see PT Problem List). Pt will benefit from skilled PT to increase their independence and safety with mobility to allow discharge to the venue listed below.       Follow Up Recommendations Home health PT;Supervision - Intermittent    Equipment Recommendations  Rolling walker with 5" wheels    Recommendations for Other Services       Precautions / Restrictions Precautions Precautions: Fall Restrictions Weight Bearing Restrictions: No      Mobility  Bed Mobility Overal bed mobility: Needs Assistance Bed Mobility: Sit to Sidelying         Sit to sidelying: Mod assist General bed mobility comments: assist to bring LEs onto bed, cues for technique  Transfers Overall transfer level: Needs assistance Equipment used: Rolling walker (2 wheeled) Transfers: Sit to/from Stand Sit to Stand: Min assist         General transfer comment: cues for hand placement, assist to rise and transition to RW  Ambulation/Gait      pt amb 85' with RW and min assist           Stairs            Wheelchair Mobility    Modified Rankin (Stroke Patients Only)       Balance Overall balance assessment: Needs assistance Sitting-balance support: Feet supported;No upper extremity supported Sitting balance-Leahy Scale: Fair      Standing balance support: Bilateral upper extremity supported;During functional activity Standing balance-Leahy Scale: Poor Standing balance comment: reliant on UEs                             Pertinent Vitals/Pain Pain Assessment: 0-10 Pain Score: 6  Pain Location: abd Pain Descriptors / Indicators: Grimacing;Sore Pain Intervention(s): Limited activity within patient's tolerance;Monitored during session;Premedicated before session    [place] expects to be discharged to:: Private residence Living Arrangements: Alone Available Help at Discharge: Available PRN/intermittently;Friend(s) Type of Home: House Home Access: Stairs to enter   [employer] of Steps: 4 front bil rails/side 9-10 to one rail Home Layout: One level Home Equipment: Hand held shower head;Shower seat;Grab bars - tub/shower;Adaptive equipment;Walker - 2 wheels Additional Comments: Pt is separated from spouse but he helps out sometimes per her report "he's there on the weekend". Pt also reports church friends who have offered to help as well.     Prior Function Level of Independence: Independent               Hand Dominance        Extremity/Trunk Assessment   Upper Extremity Assessment Upper Extremity Assessment: Generalized weakness;Defer to OT evaluation    Lower Extremity Assessment Lower Extremity Assessment: Generalized weakness       Communication   Communication: No difficulties  Cognition Arousal/Alertness: Awake/alert Behavior  During Therapy: WFL for tasks assessed/performed Overall Cognitive Status: Within Functional Limits for tasks assessed                                        General Comments      Exercises     Assessment/Plan    PT Assessment Patient needs continued PT services  PT Problem List Decreased strength;Decreased activity tolerance;Decreased balance;Decreased knowledge of use of DME;Pain;Decreased  mobility       PT Treatment Interventions DME instruction;Therapeutic exercise;Gait training;Functional mobility training;Therapeutic activities;Patient/family education    PT Goals (Current goals can be found in the Care Plan section)  Acute Rehab PT Goals Patient Stated Goal: get well PT Goal Formulation: With patient Time For Goal Achievement: 12/09/19 Potential to Achieve Goals: Good    Frequency Min 3X/week   Barriers to discharge        Co-evaluation               AM-PAC PT "6 Clicks" Mobility  Outcome Measure Help needed turning from your back to your side while in a flat bed without using bedrails?: A Little Help needed moving from lying on your back to sitting on the side of a flat bed without using bedrails?: A Lot Help needed moving to and from a bed to a chair (including a wheelchair)?: A Little Help needed standing up from a chair using your arms (e.g., wheelchair or bedside chair)?: A Little Help needed to walk in hospital room?: A Little Help needed climbing 3-5 steps with a railing? : A Lot 6 Click Score: 16    End of Session   Activity Tolerance: Patient tolerated treatment well Patient left: in bed;with call bell/phone within reach;with bed alarm set   PT Visit Diagnosis: Difficulty in walking, not elsewhere classified (R26.2);Other abnormalities of gait and mobility (R26.89)    Time: [id number] PT Time Calculation (min) (ACUTE ONLY): 20 min   Charges:   PT Evaluation $PT Eval Low Complexity: 1 Low          Genesee Nase, PT   Acute Rehab Dept [facility]): [id number]   11/25/2019   [facility] 11/25/2019, 5:49 PM

## 2019-11-26 LAB — CBC
HCT: 26.7 % — ABNORMAL LOW (ref 36.0–46.0)
Hemoglobin: 8.7 g/dL — ABNORMAL LOW (ref 12.0–15.0)
MCH: 31.4 pg (ref 26.0–34.0)
MCHC: 32.6 g/dL (ref 30.0–36.0)
MCV: 96.4 fL (ref 80.0–100.0)
Platelets: 130 10*3/uL — ABNORMAL LOW (ref 150–400)
RBC: 2.77 MIL/uL — ABNORMAL LOW (ref 3.87–5.11)
RDW: 13.5 % (ref 11.5–15.5)
WBC: 13 10*3/uL — ABNORMAL HIGH (ref 4.0–10.5)
nRBC: 0 % (ref 0.0–0.2)

## 2019-11-26 LAB — POTASSIUM: Potassium: 3.8 mmol/L (ref 3.5–5.1)

## 2019-11-26 LAB — CREATININE, SERUM
Creatinine, Ser: 0.53 mg/dL (ref 0.44–1.00)
GFR calc Af Amer: >60 mL/min (ref >60)
GFR calc non Af Amer: >60 mL/min (ref >60)

## 2019-11-26 NOTE — Progress Notes (Signed)
Emily Holloway [id number] 09/05/1955  CARE TEAM:  PCP: Carol Ada, MD  Outpatient Care Team: Patient Care Team: Carol Ada, MD as PCP - General (Family Medicine) Gatha Mayer, MD as Consulting Physician (Gastroenterology) Michael Boston, MD (General Surgery) Bjorn Loser, MD as Consulting Physician (Urology) Karle Starch, MD as Consulting Physician (Medical Oncology) Cheryll Cockayne, MD as Consulting Physician (Colon and Rectal Surgery) Aviva Signs, MD as Consulting Physician (Gastroenterology)  Inpatient Treatment Team: Treatment Team: Attending Provider: Michael Boston, MD; Registered Nurse: Jerrol Banana, RN; Technician: Berenice Bouton, NT; Physical Therapist: Neil Crouch, PT; Registered Nurse: Sibyl Parr, RN   Problem List:   Active Problems:   Parastomal hernia   3 Days Post-Op  11/23/2019  Procedure(s): XI ROBOTIC END ILEOSTOMY TAKEDOWN, INTRACORPOREAL ANASTOMOSIS, OMENTOPEXY, BILATERAL TAP BLOCK COLONOSCOPY PARASTOMAL HERNIA REPAIR LYSIS OF ADHESIONS X3 HOURS    Assessment  Parastomal hernia- POD3 s/p end ileostomy takedown, parastomal hernia repair  [facility] Stay = 3 days)  Plan: Still concern for postoperative ileus. Continue current plan.   -Continue PT to improve mobilization -Continue round-the-clock pain and nausea control -Clear liquid diet -Continue albumin IV and low dose metoprolol for tachycardia.  -VTE prophylaxis- SCDs, etc -mobilize as tolerated to help recovery  30 minutes spent in review, evaluation, examination, counseling, and coordination of care.  More than 50% of that time was spent in counseling.  11/26/2019    Subjective: Patient overall feeling slightly improved from yesterday.   Still having pain and nausea, but improved. Able to tolerate more mobility.   Tolerated PT/OT and sitting up in chair during the day.    Objective:  Vital signs:  Vitals:   11/25/19 1434  11/25/19 2105 11/26/19 0145 11/26/19 0519  BP: 117/67 109/64 119/76 105/71  Pulse: (!) 109 (!) 107 (!) 108 94  Resp: 17 14 19 20   Temp: 98.2 F (36.8 C) 98.3 F (36.8 C) 98.5 F (36.9 C) 98.4 F (36.9 C)  TempSrc: Oral     SpO2: 98% 97% 98% 99%  Weight:      Height:        Last BM Date: 11/22/19  Intake/Output   Yesterday:  04/30 0701 - 05/01 0700 In: 900 [P.O.:900] Out: 1400 [Urine:1400] This shift:  No intake/output data recorded.  Bowel function:  Flatus: No  BM:  No  Drain: (No drain)   Physical Exam:  General: Pt awake/alert in no acute distress. Somewhat fatigued Eyes: PERRL, normal EOM.  Sclera clear.  No icterus Neuro: CN II-XII intact w/o focal sensory/motor deficits. Lymph: No head/neck/groin lymphadenopathy Psych:  No delerium/psychosis/paranoia.  Oriented x 4 HENT: Normocephalic, Mucus membranes moist.  No thrush Neck: Supple, No tracheal deviation.  No obvious thyromegaly Chest: No pain to chest wall compression.  Good respiratory excursion.  No audible wheezing CV:  Pulses intact.  Regular rhythm.  No major extremity edema MS: Normal AROM mjr joints.  No obvious deformity  Abdomen: Soft.  Mildy distended.  Mild diffuse tenderness, worse over incision sites.  No evidence of peritonitis.  No incarcerated hernias. Incisions clean/dry/intact.   Ext:   No deformity.  No mjr edema.  No cyanosis Skin: No petechiae / purpurea.  No major sores.  Warm and dry    Results:   Cultures: Recent Results (from the past 720 hour(s))  SARS CORONAVIRUS 2 (TAT 6-24 HRS) Nasopharyngeal Nasopharyngeal Swab     Status: None   Collection Time: 11/19/19  1:32 PM   Specimen: Nasopharyngeal Swab  Result Value Ref Range Status   SARS Coronavirus 2 NEGATIVE NEGATIVE Final    Comment: (NOTE) SARS-CoV-2 target nucleic acids are NOT DETECTED. The SARS-CoV-2 RNA is generally detectable in upper and lower respiratory specimens during the acute phase of infection.  Negative results do not preclude SARS-CoV-2 infection, do not rule out co-infections with other pathogens, and should not be used as the sole basis for treatment or other patient management decisions. Negative results must be combined with clinical observations, patient history, and epidemiological information. The expected result is Negative. Fact Sheet for Patients: SugarRoll.be Fact Sheet for Healthcare Providers: https://www.woods-mathews.com/ This test is not yet approved or cleared by the [place] FDA and  has been authorized for detection and/or diagnosis of SARS-CoV-2 by FDA under an Emergency Use Authorization (EUA). This EUA will remain  in effect (meaning this test can be used) for the duration of the COVID-19 declaration under Section 56 4(b)(1) of the Act, 21 U.S.C. section 360bbb-3(b)(1), unless the authorization is terminated or revoked sooner. Performed at [facility], [place] [address] [place], [place] [address]     Labs: Results for orders placed or performed during the hospital encounter of 11/23/19 (from the past 48 hour(s))  CBC     Status: Abnormal   Collection Time: 11/25/19  8:20 AM  Result Value Ref Range   WBC 16.3 (H) 4.0 - 10.5 K/uL   RBC 3.38 (L) 3.87 - 5.11 MIL/uL   Hemoglobin 10.6 (L) 12.0 - 15.0 g/dL   HCT 32.5 (L) 36.0 - 46.0 %   MCV 96.2 80.0 - 100.0 fL   MCH 31.4 26.0 - 34.0 pg   MCHC 32.6 30.0 - 36.0 g/dL   RDW 13.6 11.5 - 15.5 %   Platelets 188 150 - 400 K/uL   nRBC 0.0 0.0 - 0.2 %    Comment: Performed at [facility], [place] [address] [place], [place] [id number]  Basic metabolic panel     Status: Abnormal   Collection Time: 11/25/19  8:20 AM  Result Value Ref Range   Sodium 138 135 - 145 mmol/L   Potassium 3.7 3.5 - 5.1 mmol/L   Chloride 101 98 - 111 mmol/L   CO2 28 22 - 32 mmol/L   Glucose, Bld 116 (H) 70 - 99 mg/dL    Comment: Glucose reference range applies only  to samples taken after fasting for at least 8 hours.   BUN 8 8 - 23 mg/dL   Creatinine, Ser 0.62 0.44 - 1.00 mg/dL   Calcium 8.8 (L) 8.9 - 10.3 mg/dL   GFR calc non Af Amer >60 >60 mL/min   GFR calc Af Amer >60 >60 mL/min   Anion gap 9 5 - 15    Comment: Performed at [facility], [place] [address] [place], [place] [address]  Magnesium     Status: None   Collection Time: 11/25/19  8:20 AM  Result Value Ref Range   Magnesium 2.0 1.7 - 2.4 mg/dL    Comment: Performed at [facility], [place] [address] [place], [place] [address]  CBC     Status: Abnormal   Collection Time: 11/26/19  6:06 AM  Result Value Ref Range   WBC 13.0 (H) 4.0 - 10.5 K/uL   RBC 2.77 (L) 3.87 - 5.11 MIL/uL   Hemoglobin 8.7 (L) 12.0 - 15.0 g/dL   HCT 26.7 (L) 36.0 - 46.0 %   MCV 96.4 80.0 - 100.0 fL  MCH 31.4 26.0 - 34.0 pg   MCHC 32.6 30.0 - 36.0 g/dL   RDW 13.5 11.5 - 15.5 %   Platelets 130 (L) 150 - 400 K/uL    Comment: SPECIMEN CHECKED FOR CLOTS Immature Platelet Fraction may be clinically indicated, consider ordering this additional test [id number] REPEATED TO VERIFY    nRBC 0.0 0.0 - 0.2 %    Comment: Performed at [facility], [place] [address], [place], [place] [address]  Potassium     Status: None   Collection Time: 11/26/19  6:06 AM  Result Value Ref Range   Potassium 3.8 3.5 - 5.1 mmol/L    Comment: Performed at [facility], [place] [address], [place], [place] [address]  Creatinine, serum     Status: None   Collection Time: 11/26/19  6:06 AM  Result Value Ref Range   Creatinine, Ser 0.53 0.44 - 1.00 mg/dL   GFR calc non Af Amer >60 >60 mL/min   GFR calc Af Amer >60 >60 mL/min    Comment: Performed at [facility], [place] [address], [place], [place] [address]    Imaging / Studies: No results found.  Medications / Allergies: per chart  Antibiotics: Anti-infectives (From admission, onward)   Start      Dose/Rate Route Frequency Ordered Stop   11/24/19 0200  cefoTEtan (CEFOTAN) 2 g in sodium chloride 0.9 % 100 mL IVPB     2 g 200 mL/hr over 30 Minutes Intravenous Every 12 hours 11/23/19 1646 11/24/19 0148   11/23/19 0645  cefoTEtan (CEFOTAN) 2 g in sodium chloride 0.9 % 100 mL IVPB  Status:  Discontinued     2 g 200 mL/hr over 30 Minutes Intravenous On call to O.R. 11/23/19 [id number] 11/23/19 1646        Note: Portions of this report may have been transcribed using voice recognition software. Every effort was made to ensure accuracy; however, inadvertent computerized transcription errors may be present.   Any transcriptional errors that result from this process are unintentional.     Orbie Pyo, PA-S Patient was seen and evaluated in coordination with Dr.Steven Gross, MD     [address] [place], [place] [address] [phone] Main / Paging [phone] Fax Please see Amion for pager number, especial 5pm - 7am.

## 2019-11-26 NOTE — Progress Notes (Signed)
Physical Therapy Treatment Patient Details Name: Emily Holloway MRN: [id number] DOB: [birth year] Today's Date: 11/26/2019    History of Present Illness Pt presents with Parastomal hernia- s/p end ileostomy takedown, parastomal hernia repair    PT Comments    Pt feeling better overall today.  incr amb distance and this was 2nd walk for today (goal of 6x per surgery). Pt ok to amb with RW and family member.  Pt on RA on arrival, SpO2=90%, initial reading 83% on RA however possible arifact--incr to 90-91% after ~ 30 seconds. Encouraged pursed lip/diaphragmatic breathing and use of IS.  Will continue to follow pt for progression, work toward goal of amb without device and HEP. May not need f/u HHPT if continues to progress.  Follow Up Recommendations  Home health PT;Supervision - Intermittent(vs no f/u pending progress)     Equipment Recommendations       Recommendations for Other Services       Precautions / Restrictions Precautions Precautions: Fall Restrictions Weight Bearing Restrictions: No    Mobility  Bed Mobility               General bed mobility comments: in recliner on arrival   Transfers Overall transfer level: Needs assistance Equipment used: Rolling walker (2 wheeled) Transfers: Sit to/from Stand Sit to Stand: Min guard;Supervision         General transfer comment: cues for hand placement, min/guard to supervision for safety   Ambulation/Gait Ambulation/Gait assistance: Min guard;Supervision Gait Distance (Feet): 270 Feet Assistive device: Rolling walker (2 wheeled)       General Gait Details: for safety, LOB x1 with head turn, min-min/guard to recover    Stairs             Wheelchair Mobility    Modified Rankin (Stroke Patients Only)       Balance             Standing balance-Leahy Scale: Fair                              Cognition Arousal/Alertness: Awake/alert Behavior During Therapy: WFL for  tasks assessed/performed Overall Cognitive Status: Within Functional Limits for tasks assessed                                        Exercises General Exercises - Lower Extremity Ankle Circles/Pumps: AROM;Both;5 reps Quad Sets: AROM;Both;5 reps    General Comments        Pertinent Vitals/Pain Pain Assessment: 0-10 Pain Score: 2  Pain Location: abd Pain Descriptors / Indicators: Grimacing;Sore Pain Intervention(s): Limited activity within patient's tolerance;Monitored during session;Premedicated before session;Ice applied    Home Living                      Prior Function            PT Goals (current goals can now be found in the care plan section) Acute Rehab PT Goals Patient Stated Goal: get well PT Goal Formulation: With patient Time For Goal Achievement: 12/09/19 Potential to Achieve Goals: Good Progress towards PT goals: Progressing toward goals    Frequency    Min 3X/week      PT Plan Current plan remains appropriate    Co-evaluation              AM-PAC PT "6 Clicks"  Mobility   Outcome Measure  Help needed turning from your back to your side while in a flat bed without using bedrails?: A Little Help needed moving from lying on your back to sitting on the side of a flat bed without using bedrails?: A Little Help needed moving to and from a bed to a chair (including a wheelchair)?: A Little Help needed standing up from a chair using your arms (e.g., wheelchair or bedside chair)?: A Little Help needed to walk in hospital room?: A Little Help needed climbing 3-5 steps with a railing? : A Little 6 Click Score: 18    End of Session   Activity Tolerance: Patient tolerated treatment well Patient left: in chair;with call bell/phone within reach;with family/visitor present   PT Visit Diagnosis: Difficulty in walking, not elsewhere classified (R26.2);Other abnormalities of gait and mobility (R26.89)     Time: 1140-1201 PT  Time Calculation (min) (ACUTE ONLY): 21 min  Charges:  $Gait Training: 8-22 mins                     Baxter Flattery, PT   Acute Rehab Dept [facility]): [id number]   11/26/2019    [facility] 11/26/2019, 12:33 PM

## 2019-11-27 LAB — CBC
HCT: 31.6 % — ABNORMAL LOW (ref 36.0–46.0)
Hemoglobin: 10 g/dL — ABNORMAL LOW (ref 12.0–15.0)
MCH: 31.3 pg (ref 26.0–34.0)
MCHC: 31.6 g/dL (ref 30.0–36.0)
MCV: 98.8 fL (ref 80.0–100.0)
Platelets: 243 10*3/uL (ref 150–400)
RBC: 3.2 MIL/uL — ABNORMAL LOW (ref 3.87–5.11)
RDW: 13.6 % (ref 11.5–15.5)
WBC: 11 10*3/uL — ABNORMAL HIGH (ref 4.0–10.5)
nRBC: 0 % (ref 0.0–0.2)

## 2019-11-27 LAB — MAGNESIUM: Magnesium: 1.9 mg/dL (ref 1.7–2.4)

## 2019-11-27 LAB — POTASSIUM: Potassium: 3.4 mmol/L — ABNORMAL LOW (ref 3.5–5.1)

## 2019-11-27 LAB — CREATININE, SERUM
Creatinine, Ser: 0.54 mg/dL (ref 0.44–1.00)
GFR calc Af Amer: >60 mL/min (ref >60)
GFR calc non Af Amer: >60 mL/min (ref >60)

## 2019-11-27 MED ORDER — METHOCARBAMOL 500 MG PO TABS
1000.0000 mg | ORAL_TABLET | Freq: Four times a day (QID) | ORAL | Status: DC
  Administered 2019-11-27 – 2019-11-28 (×8): 1000 mg via ORAL
  Filled 2019-11-27 (×8): qty 2

## 2019-11-27 MED ORDER — HYDROCODONE-ACETAMINOPHEN 10-325 MG PO TABS
1.0000 | ORAL_TABLET | ORAL | Status: DC | PRN
  Administered 2019-11-27 – 2019-11-28 (×4): 1 via ORAL
  Filled 2019-11-27 (×4): qty 1

## 2019-11-27 MED ORDER — POLYETHYLENE GLYCOL 3350 17 G PO PACK
17.0000 g | PACK | Freq: Two times a day (BID) | ORAL | Status: DC
  Filled 2019-11-27 (×2): qty 1

## 2019-11-27 MED ORDER — NAPROXEN 250 MG PO TABS
500.0000 mg | ORAL_TABLET | Freq: Two times a day (BID) | ORAL | Status: DC
  Administered 2019-11-27 – 2019-11-29 (×4): 500 mg via ORAL
  Filled 2019-11-27 (×4): qty 2

## 2019-11-27 MED ORDER — ACETAMINOPHEN 500 MG PO TABS
500.0000 mg | ORAL_TABLET | Freq: Four times a day (QID) | ORAL | Status: DC
  Administered 2019-11-27 – 2019-11-29 (×5): 500 mg via ORAL
  Filled 2019-11-27 (×6): qty 1

## 2019-11-27 MED ORDER — POTASSIUM CHLORIDE CRYS ER 20 MEQ PO TBCR
40.0000 meq | EXTENDED_RELEASE_TABLET | Freq: Two times a day (BID) | ORAL | Status: AC
  Administered 2019-11-27 – 2019-11-29 (×6): 40 meq via ORAL
  Filled 2019-11-27 (×6): qty 2

## 2019-11-27 NOTE — Progress Notes (Signed)
Pharmacy Brief Note - Alvimopan (Entereg)  The standing order set for alvimopan (Entereg) now includes an automatic order to discontinue the drug after the patient has had a bowel movement. The change was approved by the [place] and the Medical Executive Committee.   This patient has had bowel movements documented by nursing. Therefore, alvimopan has been discontinued. If there are questions, please contact the pharmacy at [phone].   Thank you-  Dia Sitter, PharmD, BCPS 11/27/2019 11:28 AM

## 2019-11-27 NOTE — Progress Notes (Signed)
Occupational Therapy Treatment Patient Details Name: Emily Holloway MRN: [id number] DOB: 1956/04/20 Today's Date: 11/27/2019    History of present illness Pt presents with Parastomal hernia- s/p end ileostomy takedown, parastomal hernia repair   OT comments  Pt needed much encouragement to participate  Follow Up Recommendations  Supervision/Assistance - 24 hour;No OT follow up    Equipment Recommendations  3 in 1 bedside commode    Recommendations for Other Services      Precautions / Restrictions Restrictions Weight Bearing Restrictions: No       Mobility Bed Mobility Overal bed mobility: Needs Assistance                Transfers Overall transfer level: Needs assistance Equipment used: Rolling walker (2 wheeled) Transfers: Sit to/from Stand;Stand Pivot Transfers Sit to Stand: Min assist Stand pivot transfers: Min assist       General transfer comment: cues for hand placement, assist to rise and transition to RW    Balance Overall balance assessment: Needs assistance Sitting-balance support: Feet supported;No upper extremity supported Sitting balance-Leahy Scale: Fair     Standing balance support: Bilateral upper extremity supported;During functional activity Standing balance-Leahy Scale: Poor Standing balance comment: reliant on UEs                           ADL either performed or assessed with clinical judgement   ADL Overall ADL's : Needs assistance/impaired                         Toilet Transfer: Minimal assistance;BSC;Stand-pivot;Cueing for safety;Cueing for sequencing   Toileting- Clothing Manipulation and Hygiene: Minimal assistance;Sit to/from stand;Cueing for safety;Cueing for compensatory techniques       Functional mobility during ADLs: Rolling walker;Minimal assistance General ADL Comments: Pt initially refused getting OOB. Pt agreed after [facility] encouragement. Bed to [facility] to chair     Vision Patient  Visual Report: No change from baseline            Cognition Arousal/Alertness: Awake/alert Behavior During Therapy: WFL for tasks assessed/performed Overall Cognitive Status: Within Functional Limits for tasks assessed                                                     Pertinent Vitals/ Pain       Pain Score: 5  Pain Location: abd Pain Descriptors / Indicators: Grimacing;Sore Pain Intervention(s): Limited activity within patient's tolerance;Patient requesting pain meds-RN notified;Monitored during session;Repositioned  Home Living                                              Frequency  Min 2X/week           Plan Discharge plan remains appropriate       AM-PAC OT "6 Clicks" Daily Activity     Outcome Measure   Help from another person eating meals?: None Help from another person taking care of personal grooming?: None Help from another person toileting, which includes using toliet, bedpan, or urinal?: A Little Help from another person bathing (including washing, rinsing, drying)?: A Little Help from another person to put on and taking off regular upper  body clothing?: None Help from another person to put on and taking off regular lower body clothing?: A Little 6 Click Score: 21    End of Session Equipment Utilized During Treatment: Rolling walker  OT Visit Diagnosis: Unsteadiness on feet (R26.81);Muscle weakness (generalized) (M62.81)   Activity Tolerance Patient tolerated treatment well   Patient Left in chair;with call bell/phone within reach   Nurse Communication Mobility status        Time: [id number] OT Time Calculation (min): 14 min  Charges: OT General Charges $OT Visit: 1 Visit OT Treatments $Self Care/Home Management : 8-22 mins  Kari Baars, [place] [phone] Office- [phone]      H[place] Pellicane, Edwena Felty D 11/27/2019, 11:08 AM

## 2019-11-27 NOTE — Progress Notes (Signed)
Emily Holloway [id number] August 06, 1955  CARE TEAM:  PCP: Carol Ada, MD  Outpatient Care Team: Patient Care Team: Carol Ada, MD as PCP - General (Family Medicine) Gatha Mayer, MD as Consulting Physician (Gastroenterology) Michael Boston, MD (General Surgery) Bjorn Loser, MD as Consulting Physician (Urology) Karle Starch, MD as Consulting Physician (Medical Oncology) Cheryll Cockayne, MD as Consulting Physician (Colon and Rectal Surgery) Aviva Signs, MD as Consulting Physician (Gastroenterology)  Inpatient Treatment Team: Treatment Team: Attending Provider: Michael Boston, MD; Registered Nurse: Jerrol Banana, RN; Technician: Berenice Bouton, NT; Registered Nurse: Sibyl Parr, RN; Occupational Therapist: Betsy Pries, OT   Problem List:   Active Problems:   Parastomal hernia   4 Days Post-Op  11/23/2019  Procedure(s): XI ROBOTIC END ILEOSTOMY TAKEDOWN, INTRACORPOREAL ANASTOMOSIS, OMENTOPEXY, BILATERAL TAP BLOCK COLONOSCOPY PARASTOMAL HERNIA REPAIR LYSIS OF ADHESIONS X3 HOURS    Assessment  Parastomal hernia- POD4 s/p end ileostomy takedown, parastomal hernia repair  [facility] Stay = 4 days)  Plan: Patient having increased nausea and pain today, optimize pain/nausea control with scheduled Zofran and Tramadol.  Patient moved bowels this morning which diminishes the concern of post-op ileus.   -Continue pain and nausea control -Continue PT to assist with mobilization. We appreciate their support in her care.  -Replace potassium -Continue on dysphagia 1 diet -VTE prophylaxis- SCDs, etc -mobilize as tolerated to help recovery  20 minutes spent in review, evaluation, examination, counseling, and coordination of care.  More than 50% of that time was spent in counseling.  D/C patient from hospital when patient meets criteria (anticipate in 3-7 day(s)):  Tolerating oral intake well Ambulating well Adequate pain control  without IV medications Urinating  Having flatus Disposition planning in place   11/27/2019    Subjective:  Patient having increased nausea and pain today. Relieved with Zofran/Tramadol.   Patient mobilized with PT yesterday.   Bowel movement and flatus passed this AM.   Dressings removed this morning.   Objective:  Vital signs:  Vitals:   11/26/19 0902 11/26/19 1350 11/26/19 2121 11/26/19 2125  BP: 105/72 (!) 93/57 108/71   Pulse: 91 93 (!) 102   Resp:   16   Temp: 98 F (36.7 C) (!) 97.5 F (36.4 C) 98.2 F (36.8 C)   TempSrc: Oral Oral Oral   SpO2: 99% 96% (!) 86% 96%  Weight:      Height:        Last BM Date: 11/26/19  Intake/Output   Yesterday:  05/01 0701 - 05/02 0700 In: 930.6 [P.O.:720; I.V.:3; IV Piggyback:207.6] Out: 600 [Urine:600] This shift:  Total I/O In: -  Out: 400 [Urine:400]  Bowel function:  Flatus: YES  BM:  YES  Drain: (No drain)   Physical Exam:  General: Pt awake/alert in mild acute distress Eyes: PERRL, normal EOM.  Sclera clear.  No icterus Neuro: CN II-XII intact w/o focal sensory/motor deficits. Lymph: No head/neck/groin lymphadenopathy Psych:  No delerium/psychosis/paranoia.  Oriented x 4 HENT: Normocephalic, Mucus membranes moist.  No thrush Neck: Supple, No tracheal deviation.  No obvious thyromegaly Chest: No pain to chest wall compression.  Good respiratory excursion.  No audible wheezing CV:  Pulses intact.  Regular rhythm.  No major extremity edema MS: Normal AROM mjr joints.  No obvious deformity  Abdomen: Soft.  Mildy distended.  Mildly tender at incisions only.  No evidence of peritonitis.  No incarcerated hernias.  Ext:  Dressings removed.  Incisions clean/dry/intact. No deformity.  No mjr edema.  No cyanosis Skin: No petechiae / purpurea.  No major sores.  Warm and dry    Results:   Cultures: Recent Results (from the past 720 hour(s))  SARS CORONAVIRUS 2 (TAT 6-24 HRS) Nasopharyngeal Nasopharyngeal  Swab     Status: None   Collection Time: 11/19/19  1:32 PM   Specimen: Nasopharyngeal Swab  Result Value Ref Range Status   SARS Coronavirus 2 NEGATIVE NEGATIVE Final    Comment: (NOTE) SARS-CoV-2 target nucleic acids are NOT DETECTED. The SARS-CoV-2 RNA is generally detectable in upper and lower respiratory specimens during the acute phase of infection. Negative results do not preclude SARS-CoV-2 infection, do not rule out co-infections with other pathogens, and should not be used as the sole basis for treatment or other patient management decisions. Negative results must be combined with clinical observations, patient history, and epidemiological information. The expected result is Negative. Fact Sheet for Patients: SugarRoll.be Fact Sheet for Healthcare Providers: https://www.woods-mathews.com/ This test is not yet approved or cleared by the [place] FDA and  has been authorized for detection and/or diagnosis of SARS-CoV-2 by FDA under an Emergency Use Authorization (EUA). This EUA will remain  in effect (meaning this test can be used) for the duration of the COVID-19 declaration under Section 56 4(b)(1) of the Act, 21 U.S.C. section 360bbb-3(b)(1), unless the authorization is terminated or revoked sooner. Performed at [facility], [place] [address] [place], [place] [address]     Labs: Results for orders placed or performed during the hospital encounter of 11/23/19 (from the past 48 hour(s))  CBC     Status: Abnormal   Collection Time: 11/25/19  8:20 AM  Result Value Ref Range   WBC 16.3 (H) 4.0 - 10.5 K/uL   RBC 3.38 (L) 3.87 - 5.11 MIL/uL   Hemoglobin 10.6 (L) 12.0 - 15.0 g/dL   HCT 32.5 (L) 36.0 - 46.0 %   MCV 96.2 80.0 - 100.0 fL   MCH 31.4 26.0 - 34.0 pg   MCHC 32.6 30.0 - 36.0 g/dL   RDW 13.6 11.5 - 15.5 %   Platelets 188 150 - 400 K/uL   nRBC 0.0 0.0 - 0.2 %    Comment: Performed at [facility], [place] [address] [place], [place] [id number]  Basic metabolic panel     Status: Abnormal   Collection Time: 11/25/19  8:20 AM  Result Value Ref Range   Sodium 138 135 - 145 mmol/L   Potassium 3.7 3.5 - 5.1 mmol/L   Chloride 101 98 - 111 mmol/L   CO2 28 22 - 32 mmol/L   Glucose, Bld 116 (H) 70 - 99 mg/dL    Comment: Glucose reference range applies only to samples taken after fasting for at least 8 hours.   BUN 8 8 - 23 mg/dL   Creatinine, Ser 0.62 0.44 - 1.00 mg/dL   Calcium 8.8 (L) 8.9 - 10.3 mg/dL   GFR calc non Af Amer >60 >60 mL/min   GFR calc Af Amer >60 >60 mL/min   Anion gap 9 5 - 15    Comment: Performed at [facility], [place] [address] [place], [place] [address]  Magnesium     Status: None   Collection Time: 11/25/19  8:20 AM  Result Value Ref Range   Magnesium 2.0 1.7 - 2.4 mg/dL    Comment: Performed at [facility], [place] [address] [place],  [address]  CBC     Status: Abnormal  Collection Time: 11/26/19  6:06 AM  Result Value Ref Range   WBC 13.0 (H) 4.0 - 10.5 K/uL   RBC 2.77 (L) 3.87 - 5.11 MIL/uL   Hemoglobin 8.7 (L) 12.0 - 15.0 g/dL   HCT 26.7 (L) 36.0 - 46.0 %   MCV 96.4 80.0 - 100.0 fL   MCH 31.4 26.0 - 34.0 pg   MCHC 32.6 30.0 - 36.0 g/dL   RDW 13.5 11.5 - 15.5 %   Platelets 130 (L) 150 - 400 K/uL    Comment: SPECIMEN CHECKED FOR CLOTS Immature Platelet Fraction may be clinically indicated, consider ordering this additional test [id number] REPEATED TO VERIFY    nRBC 0.0 0.0 - 0.2 %    Comment: Performed at [facility], [place] [address] [place], [place] [address]  Potassium     Status: None   Collection Time: 11/26/19  6:06 AM  Result Value Ref Range   Potassium 3.8 3.5 - 5.1 mmol/L    Comment: Performed at [facility], [place] [address] [place], [place] [address]  Creatinine, serum     Status: None   Collection Time: 11/26/19  6:06 AM  Result Value Ref Range    Creatinine, Ser 0.53 0.44 - 1.00 mg/dL   GFR calc non Af Amer >60 >60 mL/min   GFR calc Af Amer >60 >60 mL/min    Comment: Performed at [facility], [place] [address], [place], [place] [address]  CBC     Status: Abnormal   Collection Time: 11/27/19  3:23 AM  Result Value Ref Range   WBC 11.0 (H) 4.0 - 10.5 K/uL   RBC 3.20 (L) 3.87 - 5.11 MIL/uL   Hemoglobin 10.0 (L) 12.0 - 15.0 g/dL   HCT 31.6 (L) 36.0 - 46.0 %   MCV 98.8 80.0 - 100.0 fL   MCH 31.3 26.0 - 34.0 pg   MCHC 31.6 30.0 - 36.0 g/dL   RDW 13.6 11.5 - 15.5 %   Platelets 243 150 - 400 K/uL   nRBC 0.0 0.0 - 0.2 %    Comment: Performed at [facility], [place] [address], [place], [place] [address]  Potassium     Status: Abnormal   Collection Time: 11/27/19  3:23 AM  Result Value Ref Range   Potassium 3.4 (L) 3.5 - 5.1 mmol/L    Comment: Performed at [facility], [place] [address], [place], [place] [address]  Creatinine, serum     Status: None   Collection Time: 11/27/19  3:23 AM  Result Value Ref Range   Creatinine, Ser 0.54 0.44 - 1.00 mg/dL   GFR calc non Af Amer >60 >60 mL/min   GFR calc Af Amer >60 >60 mL/min    Comment: Performed at [facility], [place] [address], [place],  [address]    Imaging / Studies: No results found.  Medications / Allergies: per chart  Antibiotics: Anti-infectives (From admission, onward)   Start     Dose/Rate Route Frequency Ordered Stop   11/24/19 0200  cefoTEtan (CEFOTAN) 2 g in sodium chloride 0.9 % 100 mL IVPB     2 g 200 mL/hr over 30 Minutes Intravenous Every 12 hours 11/23/19 1646 11/24/19 0148   11/23/19 0645  cefoTEtan (CEFOTAN) 2 g in sodium chloride 0.9 % 100 mL IVPB  Status:  Discontinued     2 g 200 mL/hr over 30 Minutes Intravenous On call to O.R. 11/23/19 [id number] 11/23/19 1646  Note: Portions of this report may have been transcribed using voice recognition software. Every effort was made to  ensure accuracy; however, inadvertent computerized transcription errors may be present.   Any transcriptional errors that result from this process are unintentional.     Orbie Pyo, PA-S Patient was seen and evaluated in coordination with Dr.Steven Gross, MD     [address] [place], [place] [address] [phone] Main / Paging [phone] Fax Please see Amion for pager number, especial 5pm - 7am.

## 2019-11-28 LAB — CBC
HCT: 33.5 % — ABNORMAL LOW (ref 36.0–46.0)
Hemoglobin: 10.4 g/dL — ABNORMAL LOW (ref 12.0–15.0)
MCH: 31 pg (ref 26.0–34.0)
MCHC: 31 g/dL (ref 30.0–36.0)
MCV: 99.7 fL (ref 80.0–100.0)
Platelets: 311 10*3/uL (ref 150–400)
RBC: 3.36 MIL/uL — ABNORMAL LOW (ref 3.87–5.11)
RDW: 13.7 % (ref 11.5–15.5)
WBC: 11.6 10*3/uL — ABNORMAL HIGH (ref 4.0–10.5)
nRBC: 0 % (ref 0.0–0.2)

## 2019-11-28 LAB — CREATININE, SERUM
Creatinine, Ser: 0.48 mg/dL (ref 0.44–1.00)
GFR calc Af Amer: >60 mL/min (ref >60)
GFR calc non Af Amer: >60 mL/min (ref >60)

## 2019-11-28 LAB — POTASSIUM: Potassium: 4.1 mmol/L (ref 3.5–5.1)

## 2019-11-28 MED ORDER — SIMETHICONE 40 MG/0.6ML PO SUSP
40.0000 mg | Freq: Four times a day (QID) | ORAL | Status: DC
  Administered 2019-11-28 (×4): 40 mg via ORAL
  Filled 2019-11-28 (×5): qty 0.6

## 2019-11-28 MED ORDER — SODIUM CHLORIDE 0.9 % IV SOLN
25.0000 mg | Freq: Once | INTRAVENOUS | Status: AC
  Administered 2019-11-28: 25 mg via INTRAVENOUS
  Filled 2019-11-28: qty 0.5

## 2019-11-28 MED ORDER — CALCIUM POLYCARBOPHIL 625 MG PO TABS
625.0000 mg | ORAL_TABLET | Freq: Two times a day (BID) | ORAL | Status: DC
  Administered 2019-11-28 – 2019-11-29 (×4): 625 mg via ORAL
  Filled 2019-11-28 (×5): qty 1

## 2019-11-28 MED ORDER — METOCLOPRAMIDE HCL 5 MG PO TABS
5.0000 mg | ORAL_TABLET | Freq: Three times a day (TID) | ORAL | Status: AC
  Administered 2019-11-28 – 2019-11-29 (×6): 5 mg via ORAL
  Filled 2019-11-28 (×6): qty 1

## 2019-11-28 MED ORDER — SODIUM CHLORIDE 0.9 % IV SOLN
500.0000 mg | Freq: Once | INTRAVENOUS | Status: AC
  Administered 2019-11-28: 500 mg via INTRAVENOUS
  Filled 2019-11-28: qty 10

## 2019-11-28 NOTE — Progress Notes (Addendum)
Evelynn Madera [id number] [birth year]  CARE TEAM:  PCP: Carol Ada, MD  Outpatient Care Team: Patient Care Team: Carol Ada, MD as PCP - General (Family Medicine) Gatha Mayer, MD as Consulting Physician (Gastroenterology) Michael Boston, MD (General Surgery) Bjorn Loser, MD as Consulting Physician (Urology) Karle Starch, MD as Consulting Physician (Medical Oncology) Cheryll Cockayne, MD as Consulting Physician (Colon and Rectal Surgery) Aviva Signs, MD as Consulting Physician (Gastroenterology)  Inpatient Treatment Team: Treatment Team: Attending Provider: Michael Boston, MD; Technician: Berenice Bouton, NT; Registered Nurse: Sibyl Parr, RN; Utilization Review: Sindy Guadeloupe, RN   Problem List:   Principal Problem:   Recurrent parastomal hernia s/p ileostomy takedown 11/23/2019 Active Problems:   Recurrent rectovaginal fistula, 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  Takedown 11/23/2019   History of rectal cancer, T2N0, Nov2010   IBS (irritable bowel syndrome)   Anemia in chronic illness   Osteopenia   History of kidney stones   Depressed affect   Obesity (BMI 30-39.9)   5 Days Post-Op  11/23/2019  POST-OPERATIVE DIAGNOSIS:   PARASTOMAL HERNIA  AT END ILEOSTOMY HISTORY OF RECTOVAGINAL FISTULA HISTORY OF RECTAL CANCER  PROCEDURE:   XI ROBOTIC END ILEOSTOMY TAKEDOWN - INTRACORPOREAL ANASTOMOSIS ROBOTIC LYSIS OF ADHESIONS x3 HOURS ILEAL RESECTION (ILEOSTOMY) OMENTOPEXY BILATERAL TAP BLOCK PRIMARY PARASTOMAL HERNIA REPAIR COLONOSCOPY (See Dr Orest Dikes separate OR note)  SURGEON:  Adin Hector, MD  ASSISTANTS:  Nadeen Landau, MD Fran Lowes, PA-S, [facility] An experienced assistant was required given the standard of surgical care given the complexity of the case.  This assistant was needed for exposure, dissection, suctioning, retraction, instrument exchange, etc.     Assessment  Slowly  recovering  [facility] Stay = 5 days)  Plan: Try to improve pain control with nonnarcotic means.  Try and mobilize more.  Simethicone and Reglan for bloating and discomfort.  See if that can help with.  Bowel regimen.  Adjust to a different supplement since she is having some bloating.  Anxiolysis.  IV iron for acute postoperative on chronic anemia.  Hold off on transfusion.  Tachycardia resolved without shock.  Wean off oxygen.  No electrolyte abnormalities.  Follow.  20 minutes spent in review, evaluation, examination, counseling, and coordination of care.  More than 50% of that time was spent in counseling.  D/C patient from hospital when patient meets criteria (anticipate in 3-7 day(s)):  Tolerating oral intake well Ambulating well Adequate pain control without IV medications Urinating  Having flatus Disposition planning in place   11/28/2019    Subjective: Patient having many bowel movements.  Not incontinent.  Has crampy abdominal bloatedness.  Not much of an appetite but trying to eat.  Tolerated mashed potatoes last night.  No retching.  Using IV pain medications.  Did not want to walk much yesterday.  Objective:  Vital signs:  Vitals:   11/27/19 1348 11/27/19 1805 11/27/19 2207 11/28/19 0626  BP: 115/76 118/74 106/71 121/73  Pulse: 95 84 93 90  Resp: 15 17 17 18   Temp: 98.3 F (36.8 C) 98.2 F (36.8 C) 98.5 F (36.9 C)   TempSrc: Oral Oral Oral   SpO2: 98% 100% 100% 99%  Weight:      Height:        Last BM Date: 11/27/19  Intake/Output   Yesterday:  05/02 0701 - 05/03 0700 In: 483 [P.O.:480; I.V.:3] Out: 501 [Urine:500; Stool:1] This shift:  No intake/output data recorded.  Bowel function:  Flatus: YES  BM:  YES  Drain: (No drain)   Physical Exam:  General: Pt awake/alert in no acute distress.  Tired but not toxic. Eyes: PERRL, normal EOM.  Sclera clear.  No icterus Neuro: CN II-XII intact w/o focal sensory/motor  deficits. Lymph: No head/neck/groin lymphadenopathy Psych:  No delerium/psychosis/paranoia.  Oriented x 4 HENT: Normocephalic, Mucus membranes moist.  No thrush Neck: Supple, No tracheal deviation.  No obvious thyromegaly Chest: No pain to chest wall compression.  Good respiratory excursion.  No audible wheezing CV:  Pulses intact.  Regular rhythm.  No major extremity edema MS: Normal AROM mjr joints.  No obvious deformity  Abdomen: Soft.  Mildy distended.  Mildly tender at incisions only.  Incisions clean dry intact.  Mild discomfort at old left lower quadrant ileostomy/parastomal hernia incision.  No cellulitis or abscess.  No evidence of peritonitis.  No incarcerated hernias.  Ext:  No deformity.  No mjr edema.  No cyanosis Skin: No petechiae / purpurea.  No major sores.  Warm and dry    Results:   PATHOLOGY  SURGICAL PATHOLOGY  CASE: [id number]  PATIENT: Charolotte Eke  Surgical Pathology Report      Clinical History: Parastomal hernia at end ileostomy (crm)      FINAL MICROSCOPIC DIAGNOSIS:   A. ILEOSTOMY:  - Ileostomy with mild inflammation.  - No evidence of malignancy.    Helina Hullum DESCRIPTION:   Received fresh are 2 segments of adherent bowel, 7 and 8 cm in length  and averaging 2.5 cm in diameter, joint due to a single portion of bowel  1 end and stapled at the opposite end. There is also a 6 x 3.5 x 1 cm  portion of adherent membranous tissue encasing adipose tissue. Upon  opening, mucosa is tan-pink with unremarkable mucosal folding. There is  a thin rim of possible skin at one end. Wall thickness averages 0.4 cm.  Discrete lesions are grossly absent. Representative sections submitted  in 1 cassette. (AK 11/24/19)     Final Diagnosis performed by Claudette Laws, MD.  Electronically signed  11/25/2019  Technical and / or Professional components performed at [facility], [place] [address], [place], [place] [address].   Immunohistochemistry Technical component (if applicable) was performed  at [facility]. [address] [place], [place] [address].  IMMUNOHISTOCHEMISTRY DISCLAIMER (if applicable):  Some of these immunohistochemical stains may have been developed and the  performance characteristics determine by [facility]. Some  may not have been cleared or approved by the U.S. Food and Drug  Administration. The FDA has determined that such clearance or approval  is not necessary. This test is used for clinical purposes. It should not  be regarded as investigational or for research. This laboratory is  certified under the [place]  (CLIA-88) as qualified to perform high complexity clinical laboratory  testing. The controls stained appropriately.   Cultures: Recent Results (from the past 720 hour(s))  SARS CORONAVIRUS 2 (TAT 6-24 HRS) Nasopharyngeal Nasopharyngeal Swab     Status: None   Collection Time: 11/19/19  1:32 PM   Specimen: Nasopharyngeal Swab  Result Value Ref Range Status   SARS Coronavirus 2 NEGATIVE NEGATIVE Final    Comment: (NOTE) SARS-CoV-2 target nucleic acids are NOT DETECTED. The SARS-CoV-2 RNA is generally detectable in upper and lower respiratory specimens during the acute phase of infection. Negative results do not preclude SARS-CoV-2 infection, do not rule out co-infections with other pathogens, and  should not be used as the sole basis for treatment or other patient management decisions. Negative results must be combined with clinical observations, patient history, and epidemiological information. The expected result is Negative. Fact Sheet for Patients: SugarRoll.be Fact Sheet for Healthcare Providers: https://www.woods-mathews.com/ This test is not yet approved or cleared by the [place] FDA and  has been authorized for detection and/or  diagnosis of SARS-CoV-2 by FDA under an Emergency Use Authorization (EUA). This EUA will remain  in effect (meaning this test can be used) for the duration of the COVID-19 declaration under Section 56 4(b)(1) of the Act, 21 U.S.C. section 360bbb-3(b)(1), unless the authorization is terminated or revoked sooner. Performed at [facility], [place] [address], [place], [place] [address]     Labs: Results for orders placed or performed during the hospital encounter of 11/23/19 (from the past 48 hour(s))  CBC     Status: Abnormal   Collection Time: 11/27/19  3:23 AM  Result Value Ref Range   WBC 11.0 (H) 4.0 - 10.5 K/uL   RBC 3.20 (L) 3.87 - 5.11 MIL/uL   Hemoglobin 10.0 (L) 12.0 - 15.0 g/dL   HCT 31.6 (L) 36.0 - 46.0 %   MCV 98.8 80.0 - 100.0 fL   MCH 31.3 26.0 - 34.0 pg   MCHC 31.6 30.0 - 36.0 g/dL   RDW 13.6 11.5 - 15.5 %   Platelets 243 150 - 400 K/uL   nRBC 0.0 0.0 - 0.2 %    Comment: Performed at [facility], [place] [address] [place], [place] [address]  Potassium     Status: Abnormal   Collection Time: 11/27/19  3:23 AM  Result Value Ref Range   Potassium 3.4 (L) 3.5 - 5.1 mmol/L    Comment: Performed at [facility], [place] [address], [place], [place] [address]  Creatinine, serum     Status: None   Collection Time: 11/27/19  3:23 AM  Result Value Ref Range   Creatinine, Ser 0.54 0.44 - 1.00 mg/dL   GFR calc non Af Amer >60 >60 mL/min   GFR calc Af Amer >60 >60 mL/min    Comment: Performed at [facility], [place] [address], [place], [place] [address]  Magnesium     Status: None   Collection Time: 11/27/19  3:23 AM  Result Value Ref Range   Magnesium 1.9 1.7 - 2.4 mg/dL    Comment: Performed at [facility], [place] [address] [place], [place] [address]  CBC     Status: Abnormal   Collection Time: 11/28/19  3:37 AM  Result Value Ref Range   WBC 11.6 (H) 4.0 - 10.5 K/uL   RBC 3.36 (L) 3.87 - 5.11  MIL/uL   Hemoglobin 10.4 (L) 12.0 - 15.0 g/dL   HCT 33.5 (L) 36.0 - 46.0 %   MCV 99.7 80.0 - 100.0 fL   MCH 31.0 26.0 - 34.0 pg   MCHC 31.0 30.0 - 36.0 g/dL   RDW 13.7 11.5 - 15.5 %   Platelets 311 150 - 400 K/uL   nRBC 0.0 0.0 - 0.2 %    Comment: Performed at [facility], [place] [address] [place], [place] [address]  Potassium     Status: None   Collection Time: 11/28/19  3:37 AM  Result Value Ref Range   Potassium 4.1 3.5 - 5.1 mmol/L    Comment: DELTA CHECK NOTED Performed at [place]  [address] [place], [place] [address]   Creatinine, serum     Status: None   Collection Time: 11/28/19  3:37 AM  Result Value Ref Range   Creatinine, Ser 0.48 0.44 - 1.00 mg/dL   GFR calc non Af Amer >60 >60 mL/min   GFR calc Af Amer >60 >60 mL/min    Comment: Performed at [facility], [place] [address], [place], [place] [address]    Imaging / Studies: No results found.  Medications / Allergies: per chart  Antibiotics: Anti-infectives (From admission, onward)   Start     Dose/Rate Route Frequency Ordered Stop   11/24/19 0200  cefoTEtan (CEFOTAN) 2 g in sodium chloride 0.9 % 100 mL IVPB     2 g 200 mL/hr over 30 Minutes Intravenous Every 12 hours 11/23/19 1646 11/24/19 0148   11/23/19 0645  cefoTEtan (CEFOTAN) 2 g in sodium chloride 0.9 % 100 mL IVPB  Status:  Discontinued     2 g 200 mL/hr over 30 Minutes Intravenous On call to O.R. 11/23/19 [id number] 11/23/19 1646        Note: Portions of this report may have been transcribed using voice recognition software. Every effort was made to ensure accuracy; however, inadvertent computerized transcription errors may be present.   Any transcriptional errors that result from this process are unintentional.     Adin Hector, MD, FACS, MASCRS Gastrointestinal and Minimally Invasive Surgery  [facility] [address] [place], [place] [address] [phone] Fax [phone] Main/Paging  CONTACT INFORMATION: Weekday (9AM-5PM) concerns: Call [facility] main office at [phone] Weeknight (5PM-9AM) or Weekend/Holiday concerns: Check [email or web address] for General Surgery [facility] coverage (Please, do not use SecureChat as it is not reliable communication to surgeons for patient care)

## 2019-11-28 NOTE — Progress Notes (Signed)
Occupational Therapy Treatment Patient Details Name: Trashawn Fakhoury MRN: [id number] DOB: Jun 06, 1956 Today's Date: 11/28/2019    History of present illness Pt presents with Parastomal hernia- s/p end ileostomy takedown, parastomal hernia repair   OT comments  Pt progressing slowly.  Pt not feeling as well this day.  Pt still motivated to get well   Follow Up Recommendations  Supervision/Assistance - 24 hour;Home health OT(pt may need HHOT as progress is slow)    Equipment Recommendations  3 in 1 bedside commode    Recommendations for Other Services      Precautions / Restrictions Precautions Precautions: Fall       Mobility Bed Mobility               General bed mobility comments: in recliner on arrival   Transfers Overall transfer level: Needs assistance Equipment used: Rolling walker (2 wheeled) Transfers: Sit to/from [facility] Sit to Stand: Min guard Stand pivot transfers: Min guard       General transfer comment: cues for hand placement, min/guard to supervision for safety     Balance Overall balance assessment: Needs assistance Sitting-balance support: Feet supported;No upper extremity supported Sitting balance-Leahy Scale: Fair     Standing balance support: Bilateral upper extremity supported;During functional activity Standing balance-Leahy Scale: Poor Standing balance comment: reliant on UEs                           ADL either performed or assessed with clinical judgement   ADL Overall ADL's : Needs assistance/impaired     Grooming: Wash/dry face;Standing;Min guard                   Toilet Transfer: Minimal assistance;BSC;Stand-pivot;Cueing for safety;Cueing for sequencing   Toileting- Clothing Manipulation and Hygiene: Minimal assistance;Sit to/from stand;Cueing for safety;Cueing for compensatory techniques       Functional mobility during ADLs: Rolling walker;Minimal assistance General ADL  Comments: pt wanted to get back in bed after BSC. Pt fatigued after BSC wanted to get back to bed     Vision Patient Visual Report: No change from baseline            Cognition Arousal/Alertness: Awake/alert Behavior During Therapy: WFL for tasks assessed/performed Overall Cognitive Status: Within Functional Limits for tasks assessed                                                     Pertinent Vitals/ Pain       Pain Score: 4  Pain Descriptors / Indicators: Grimacing;Sore Pain Intervention(s): Limited activity within patient's tolerance;Repositioned         Frequency  Min 2X/week        Progress Toward Goals  OT Goals(current goals can now be found in the care plan section)  Progress towards OT goals: Progressing toward goals(slowly)     Plan Discharge plan needs to be updated       AM-PAC OT "6 Clicks" Daily Activity     Outcome Measure   Help from another person eating meals?: None Help from another person taking care of personal grooming?: None Help from another person toileting, which includes using toliet, bedpan, or urinal?: A Little Help from another person bathing (including washing, rinsing, drying)?: A Little Help from another person to put on and  taking off regular upper body clothing?: None Help from another person to put on and taking off regular lower body clothing?: A Little 6 Click Score: 21    End of Session Equipment Utilized During Treatment: Rolling walker  OT Visit Diagnosis: Unsteadiness on feet (R26.81);Muscle weakness (generalized) (M62.81)   Activity Tolerance Patient tolerated treatment well   Patient Left with call bell/phone within reach;in bed   Nurse Communication Mobility status        Time: [id number] OT Time Calculation (min): 13 min  Charges: OT General Charges $OT Visit: 1 Visit OT Treatments $Self Care/Home Management : 8-22 mins  Kari Baars, [place] [phone] Office- [place], Edwena Felty D 11/28/2019, 6:06 PM

## 2019-11-29 ENCOUNTER — Encounter (HOSPITAL_COMMUNITY): Payer: Self-pay | Admitting: Surgery

## 2019-11-29 DIAGNOSIS — R4589 Other symptoms and signs involving emotional state: Secondary | ICD-10-CM

## 2019-11-29 DIAGNOSIS — E669 Obesity, unspecified: Secondary | ICD-10-CM

## 2019-11-29 MED ORDER — ACETAMINOPHEN 500 MG PO TABS
1000.0000 mg | ORAL_TABLET | Freq: Two times a day (BID) | ORAL | Status: DC
  Administered 2019-11-29: 1000 mg via ORAL
  Filled 2019-11-29: qty 2

## 2019-11-29 MED ORDER — PRO-STAT SUGAR FREE PO LIQD
30.0000 mL | Freq: Two times a day (BID) | ORAL | Status: DC
  Administered 2019-11-29 – 2019-12-01 (×3): 30 mL via ORAL
  Filled 2019-11-29 (×10): qty 30

## 2019-11-29 MED ORDER — NAPROXEN 375 MG PO TABS
375.0000 mg | ORAL_TABLET | Freq: Three times a day (TID) | ORAL | Status: DC | PRN
  Filled 2019-11-29: qty 1

## 2019-11-29 MED ORDER — HYOSCYAMINE SULFATE 0.125 MG SL SUBL
0.1250 mg | SUBLINGUAL_TABLET | Freq: Once | SUBLINGUAL | Status: AC
  Administered 2019-11-29: 0.125 mg via SUBLINGUAL
  Filled 2019-11-29: qty 1

## 2019-11-29 MED ORDER — ZINC OXIDE 40 % EX OINT
TOPICAL_OINTMENT | Freq: Two times a day (BID) | CUTANEOUS | Status: DC | PRN
  Filled 2019-11-29: qty 57

## 2019-11-29 MED ORDER — HYOSCYAMINE SULFATE 0.125 MG SL SUBL
0.1250 mg | SUBLINGUAL_TABLET | Freq: Four times a day (QID) | SUBLINGUAL | Status: DC | PRN
  Filled 2019-11-29: qty 1

## 2019-11-29 MED ORDER — METHOCARBAMOL 500 MG PO TABS: 500.0000 mg | ORAL_TABLET | Freq: Three times a day (TID) | ORAL | Status: DC | PRN

## 2019-11-29 MED ORDER — SIMETHICONE 40 MG/0.6ML PO SUSP
40.0000 mg | Freq: Four times a day (QID) | ORAL | Status: DC | PRN
  Administered 2019-11-29: 40 mg via ORAL
  Filled 2019-11-29 (×2): qty 0.6

## 2019-11-29 MED ORDER — BACID PO TABS
2.0000 | ORAL_TABLET | Freq: Two times a day (BID) | ORAL | Status: DC
  Filled 2019-11-29: qty 2

## 2019-11-29 MED ORDER — LACTINEX PO CHEW
1.0000 | CHEWABLE_TABLET | Freq: Two times a day (BID) | ORAL | Status: DC
  Administered 2019-11-29 – 2019-12-04 (×7): 1 via ORAL
  Filled 2019-11-29 (×13): qty 1

## 2019-11-29 NOTE — Progress Notes (Signed)
Initial Nutrition Assessment  DOCUMENTATION CODES:   Obesity unspecified  INTERVENTION:  48 hour calorie count initiated  -Continue Ensure surgery po BID, each supplement provides 330 kcal and 18 grams of protein -Will provide Prostat 30 ml po BID, each supplement provides 100 kcal and 15 grams of protein  NUTRITION DIAGNOSIS:   Inadequate oral intake related to poor appetite as evidenced by meal completion < 50%(abdominal bloating/discomfort per pt report).  GOAL:   Patient will meet greater than or equal to 90% of their needs   MONITOR:   Labs, I & O's, PO intake, Weight trends, Supplement acceptance, Skin  REASON FOR ASSESSMENT:   Consult Calorie Count  ASSESSMENT:  RD working remotely.  64 year old female with past medical history of rectal cancer s/p laparoscopic low anterior resection with J pouch coloanal anastomosis and loop ileostomy diversion November 2010, rectovaginal fistula, parastomal hernia s/p primary repair in 2013,  lung cancer s/p adjuvant chemotherapy presented for post-op hernia surgery on 4/28 s/p emergent lysis of adhesions with reduction of incarcerated incisional hernia and ileostomy on 03/17/20.  RD consulted for calorie count  4/28 CLD 4/29 NPO/DYS 1 4/30 CLD 5/02 DYS 1 5/03 Soft/HH  Per flowsheets, patient consumed 25% of DYS1 meal on 5/2, she consumed 40% of soft breakfast on 5/3 and 0% of lunch and dinner meals. Per chart, patient reports unable to eat very much d/t ongoing bloating and abdominal discomfort. Today she consumed 50% of breakfast, 25% of lunch, 0% of dinner, and accepting of 2 Ensure Surgery. Will provide Prostat supplement BID to aid with meeting needs and follow up with full calorie count results on Thursday.   Day 1 results: 929 kcal and 45 grams of protein (49% of minimum kcal needs, 45% of minimum protein needs)  Current wt 165.22 lbs    Admit wt 157.96 lbs Medications reviewed and include: Lactinex, Fibercon,  Klor-con No new labs for review   NUTRITION - FOCUSED PHYSICAL EXAM: Unable to complete at this time, RD working remotely.  Diet Order:   Diet Order            Diet Heart Room service appropriate? Yes; Fluid consistency: Thin  Diet effective now              EDUCATION NEEDS:   No education needs have been identified at this time  Skin:  Skin Assessment: Skin Integrity Issues: Skin Integrity Issues:: Incisions Incisions: closed; abdomen  Last BM:  5/4 type 7  Height:   Ht Readings from Last 1 Encounters:  11/23/19 5\' 2"  (1.575 m)    Weight:   Wt Readings from Last 1 Encounters:  11/25/19 75.1 kg    BMI:  Body mass index is 30.28 kg/m.  Estimated Nutritional Needs:   Kcal:  1900-2100  Protein:  100-115  Fluid:  >/= 1.9 L/day   Lajuan Lines, RD, LDN Clinical Nutrition After Hours/Weekend Pager # in [place]

## 2019-11-29 NOTE — Progress Notes (Signed)
Physical Therapy Treatment Patient Details Name: Emily Holloway MRN: [id number] DOB: [birth year] Today's Date: 11/29/2019    History of Present Illness Pt presents with Parastomal hernia- s/p end ileostomy takedown, parastomal hernia repair    PT Comments    The patient ambulated x 240' withRW. Gait slow and steady. Continue progressive mobility.   Follow Up Recommendations  Home health PT;Supervision - Intermittent     Equipment Recommendations  Rolling walker with 5" wheels    Recommendations for Other Services       Precautions / Restrictions Precautions Precautions: Fall    Mobility  Bed Mobility             Sit to sidelying: Supervision General bed mobility comments: no assistance required to return to bed  Transfers Overall transfer level: Needs assistance Equipment used: Rolling walker (2 wheeled) Transfers: Sit to/from Stand Sit to Stand: Supervision         General transfer comment: slow to rise from bed and toilet  Ambulation/Gait Ambulation/Gait assistance: Min guard;Supervision Gait Distance (Feet): 270 Feet Assistive device: Rolling walker (2 wheeled) Gait Pattern/deviations: Step-through pattern Gait velocity: decr   General Gait Details: slow speed, no balance losses   Stairs             Wheelchair Mobility    Modified Rankin (Stroke Patients Only)       Balance Overall balance assessment: No apparent balance deficits (not formally assessed)                                          Cognition Arousal/Alertness: Awake/alert Behavior During Therapy: WFL for tasks assessed/performed Overall Cognitive Status: Within Functional Limits for tasks assessed                                        Exercises      General Comments        Pertinent Vitals/Pain Pain Score: 4  Pain Location: abd Pain Descriptors / Indicators: Grimacing;Sore Pain Intervention(s): Monitored during  session    Home Living                      Prior Function            PT Goals (current goals can now be found in the care plan section) Progress towards PT goals: Progressing toward goals    Frequency    Min 3X/week      PT Plan Current plan remains appropriate    Co-evaluation              AM-PAC PT "6 Clicks" Mobility   Outcome Measure  Help needed turning from your back to your side while in a flat bed without using bedrails?: A Little Help needed moving from lying on your back to sitting on the side of a flat bed without using bedrails?: A Little Help needed moving to and from a bed to a chair (including a wheelchair)?: A Little Help needed standing up from a chair using your arms (e.g., wheelchair or bedside chair)?: A Little Help needed to walk in hospital room?: A Little Help needed climbing 3-5 steps with a railing? : A Little 6 Click Score: 18    End of Session   Activity Tolerance: Patient tolerated treatment well Patient  left: in bed;with call bell/phone within reach Nurse Communication: Mobility status PT Visit Diagnosis: Difficulty in walking, not elsewhere classified (R26.2);Other abnormalities of gait and mobility (R26.89)     Time: 1530-1603 PT Time Calculation (min) (ACUTE ONLY): 33 min  Charges:  $Gait Training: 8-22 mins $Self Care/Home Management: [place] Pager [phone] Office [phone]    Claretha Cooper 11/29/2019, 4:26 PM

## 2019-11-29 NOTE — Progress Notes (Signed)
Emily Holloway MR:[id number] Feb 24, 1956  CARE TEAM:  PCP: Carol Ada, MD  Outpatient Care Team: Patient Care Team: Carol Ada, MD as PCP - General (Family Medicine) Gatha Mayer, MD as Consulting Physician (Gastroenterology) Michael Boston, MD (General Surgery) Bjorn Loser, MD as Consulting Physician (Urology) Karle Starch, MD as Consulting Physician (Medical Oncology) Cheryll Cockayne, MD as Consulting Physician (Colon and Rectal Surgery) Aviva Signs, MD as Consulting Physician (Gastroenterology)  Inpatient Treatment Team: Treatment Team: Attending Provider: Michael Boston, MD; Technician: Berenice Bouton, NT; Registered Nurse: Sibyl Parr, RN; Registered Nurse: Arminda Resides, RN; Utilization Review: Sindy Guadeloupe, RN; Physical Therapist: Fuller Mandril, PT; Registered Nurse: Guadlupe Spanish, RN   Problem List:   Active Problems:   Parastomal hernia   6 Days Post-Op  11/23/2019  Procedure(s): XI ROBOTIC END ILEOSTOMY TAKEDOWN, INTRACORPOREAL ANASTOMOSIS, OMENTOPEXY, BILATERAL TAP BLOCK COLONOSCOPY PARASTOMAL HERNIA REPAIR LYSIS OF ADHESIONS X3 HOURS  SURGEON: Adin Hector, MD  ASSISTANTS: Nadeen Landau, MD Fran Lowes, PA-S, [facility] An experienced assistant was required given the standard of surgical care given the complexity of the case. This assistant was needed for exposure, dissection, suctioning, retraction, instrument exchange, etc.    Assessment  Parastomal hernia repair- POD6 s/p robotic end ileostomy takedown, recovering slowly  [facility] Stay = 6 days)  Plan:  -Encourage mobilization, goal of walking 1 hour/day -Start Levsin for bloating and discomfort -Optimize pain control with non-narcotics  -Continue VTE prophylaxis- SCDs, Lovenox -Continue OT/PT to help with mobilization   D/C patient from hospital when patient meets criteria (anticipate in 1-5 day(s)):  Tolerating oral intake  well Ambulating well Adequate pain control without IV medications Urinating  Having flatus Disposition planning in place  20 minutes spent in review, evaluation, examination, counseling, and coordination of care.  More than 50% of that time was spent in counseling.  11/29/2019    Subjective:  Patient in bed still having bloating and abdominal discomfort, states that her pain is from the bloating, not in her abdominal wall. Not eating very much because of this.   Last used IV pain medication yesterday.   Continues to move bowels without incontinence.   Patient did not walk much yesterday due to her discomfort.   Denies nausea, vomiting, fever, chills, shortness of breath.   Objective:  Vital signs:  Vitals:   11/28/19 1550 11/28/19 2144 11/29/19 0219 11/29/19 0618  BP: 117/68 125/70 114/71 120/73  Pulse: 93 98 96 (!) 101  Resp: 18 18 17 18   Temp: 98.3 F (36.8 C) 98.7 F (37.1 C) 97.7 F (36.5 C) 98.1 F (36.7 C)  TempSrc: Oral Oral Oral Oral  SpO2: 100% 93% 94% 95%  Weight:      Height:        Last BM Date: 11/28/19  Intake/Output   Yesterday:  05/03 0701 - 05/04 0700 In: 1141.9 [P.O.:837; I.V.:3; IV Piggyback:301.9] Out: 750 [Urine:750] This shift:  No intake/output data recorded.  Bowel function:  Flatus: YES  BM:  YES  Drain: (No drain)   Physical Exam:  General: Pt awake/alert in no acute distress. Visibly fatigued.  Eyes: PERRL, normal EOM.  Sclera clear.  No icterus Neuro: CN II-XII intact w/o focal sensory/motor deficits. Lymph: No head/neck/groin lymphadenopathy Psych:  No delerium/psychosis/paranoia.  Oriented x 4 HENT: Normocephalic, Mucus membranes moist.  No thrush Neck: Supple, No tracheal deviation.  No obvious thyromegaly Chest: No pain to chest wall compression.  Good respiratory excursion.  No audible wheezing CV:  Pulses intact.  Regular rhythm.  No major extremity edema MS: Normal AROM mjr joints.  No obvious  deformity  Abdomen: Soft.  Mildy distended.  Mildly tender at incisions only.  No evidence of peritonitis.  No incarcerated hernias. No evidence of cellulitis. Incisions clean, dry, intact.   Ext:   No deformity.  No mjr edema.  No cyanosis Skin: No petechiae / purpurea.  No major sores.  Warm and dry    Results:   Cultures: Recent Results (from the past 720 hour(s))  SARS CORONAVIRUS 2 (TAT 6-24 HRS) Nasopharyngeal Nasopharyngeal Swab     Status: None   Collection Time: 11/19/19  1:32 PM   Specimen: Nasopharyngeal Swab  Result Value Ref Range Status   SARS Coronavirus 2 NEGATIVE NEGATIVE Final    Comment: (NOTE) SARS-CoV-2 target nucleic acids are NOT DETECTED. The SARS-CoV-2 RNA is generally detectable in upper and lower respiratory specimens during the acute phase of infection. Negative results do not preclude SARS-CoV-2 infection, do not rule out co-infections with other pathogens, and should not be used as the sole basis for treatment or other patient management decisions. Negative results must be combined with clinical observations, patient history, and epidemiological information. The expected result is Negative. Fact Sheet for Patients: SugarRoll.be Fact Sheet for Healthcare Providers: https://www.woods-mathews.com/ This test is not yet approved or cleared by the [place] FDA and  has been authorized for detection and/or diagnosis of SARS-CoV-2 by FDA under an Emergency Use Authorization (EUA). This EUA will remain  in effect (meaning this test can be used) for the duration of the COVID-19 declaration under Section 56 4(b)(1) of the Act, 21 U.S.C. section 360bbb-3(b)(1), unless the authorization is terminated or revoked sooner. Performed at [facility], [place] [address], [place], [place] [address]     Labs: Results for orders placed or performed during the hospital encounter of 11/23/19 (from the past 48 hour(s))   CBC     Status: Abnormal   Collection Time: 11/28/19  3:37 AM  Result Value Ref Range   WBC 11.6 (H) 4.0 - 10.5 K/uL   RBC 3.36 (L) 3.87 - 5.11 MIL/uL   Hemoglobin 10.4 (L) 12.0 - 15.0 g/dL   HCT 33.5 (L) 36.0 - 46.0 %   MCV 99.7 80.0 - 100.0 fL   MCH 31.0 26.0 - 34.0 pg   MCHC 31.0 30.0 - 36.0 g/dL   RDW 13.7 11.5 - 15.5 %   Platelets 311 150 - 400 K/uL   nRBC 0.0 0.0 - 0.2 %    Comment: Performed at [facility], [place] [address] [place], [place] [address]  Potassium     Status: None   Collection Time: 11/28/19  3:37 AM  Result Value Ref Range   Potassium 4.1 3.5 - 5.1 mmol/L    Comment: DELTA CHECK NOTED Performed at [place] [address], [place], [place] [address]   Creatinine, serum     Status: None   Collection Time: 11/28/19  3:37 AM  Result Value Ref Range   Creatinine, Ser 0.48 0.44 - 1.00 mg/dL   GFR calc non Af Amer >60 >60 mL/min   GFR calc Af Amer >60 >60 mL/min    Comment: Performed at [facility], [place] [address], [place], [place] [address]    Imaging / Studies: No results found.  Medications / Allergies: per chart  Antibiotics: Anti-infectives (From admission, onward)    Start     Dose/Rate Route  Frequency Ordered Stop   11/24/19 0200  cefoTEtan (CEFOTAN) 2 g in sodium chloride 0.9 % 100 mL IVPB     2 g 200 mL/hr over 30 Minutes Intravenous Every 12 hours 11/23/19 1646 11/24/19 0148   11/23/19 0645  cefoTEtan (CEFOTAN) 2 g in sodium chloride 0.9 % 100 mL IVPB  Status:  Discontinued     2 g 200 mL/hr over 30 Minutes Intravenous On call to O.R. 11/23/19 [id number] 11/23/19 1646         Note: Portions of this report may have been transcribed using voice recognition software. Every effort was made to ensure accuracy; however, inadvertent computerized transcription errors may be present.   Any transcriptional errors that result from this process are unintentional.    Orbie Pyo,  PA-S Patient was seen and evaluated in coordination with Kandis Ban, MD  11/29/2019  7:55 AM

## 2019-11-29 NOTE — TOC Initial Note (Signed)
Transition of Care [facility]) - Initial/Assessment Note    Patient Details  Name: Emily Holloway MRN: [id number] Date of Birth: Aug 17, 1955  [facility]) CM/SW Contact:    Lennart Pall, LCSW Phone Number: 11/29/2019, 1:39 PM  Clinical Narrative:  Met with pt to introduce my role as she has been on unit for several days with slow progress.  Pt rather flat and expresses frustration with her slow recovery and ongoing pain and bloating.  She notes that MD "wants me to get up more and walk... but I just feel so bad."  She reports that she lives with her husband, however, he works full time as a Administrator.  She has no other family other than "church family" in the area but doesn't feel like they could assist on a daily basis.  Pt notes MD had speculated she may need a few days "at a nursing facility... I'm ok with that if I need to but I'd rather go home."  Has had daily PT or OT visits and still at overall min/guard level of function.  Will discuss further with therapies and MD/ PA if short term SNF may be needed??                 Expected Discharge Plan: [place] Barriers to Discharge: Continued Medical Work up   Patient Goals and CMS Choice Patient states their goals for this hospitalization and ongoing recovery are:: Pt goal is to be independent since she does not have 24/7 assistance      Expected Discharge Plan and Services Expected Discharge Plan: [place] In-house Referral: Clinical Social Work   Post Acute Care Choice: [place] arrangements for the past 2 months: [place]                                      Prior Living Arrangements/Services Living arrangements for the past 2 months: Single Family Home Lives with:: Spouse Patient language and need for interpreter reviewed:: Yes Do you feel safe going back to the place where you live?: Yes("as long as I can get around on my own.")      Need for  Family Participation in Patient Care: Yes (Comment)(potentially) Care giver support system in place?: No (comment)(Spouse is a Administrator)   Criminal Activity/Legal Involvement Pertinent to Current Situation/Hospitalization: No - Comment as needed  Activities of Daily Living Home Assistive Devices/Equipment: Eyeglasses ADL Screening (condition at time of admission) Patient's cognitive ability adequate to safely complete daily activities?: Yes Is the patient deaf or have difficulty hearing?: No Does the patient have difficulty seeing, even when wearing glasses/contacts?: Yes Does the patient have difficulty concentrating, remembering, or making decisions?: No Patient able to express need for assistance with ADLs?: Yes Does the patient have difficulty dressing or bathing?: No Independently performs ADLs?: Yes (appropriate for developmental age) Does the patient have difficulty walking or climbing stairs?: No Weakness of Legs: None Weakness of Arms/Hands: None  Permission Sought/Granted Permission sought to share information with : Family Supports Permission granted to share information with : Yes, Verbal Permission Granted  Share Information with NAME: Aliea Bobe     Permission granted to share info w Relationship: spouse  Permission granted to share info w Contact Information: [phone]  Emotional Assessment Appearance:: Appears older than stated age Attitude/Demeanor/Rapport: Lethargic Affect (typically observed): Depressed, Guarded, Frustrated  Orientation: : Oriented to Self, Oriented to Place, Oriented to  Time, Oriented to Situation Alcohol / Substance Use: Not Applicable Psych Involvement: No (comment)  Admission diagnosis:  Parastomal hernia [K43.5] Patient Active Problem List   Diagnosis Date Noted  . Depressed affect 11/29/2019  . Obesity (BMI 30-39.9) 11/29/2019  . Recurrent parastomal hernia s/p ileostomy takedown 11/23/2019 03/21/2019  . Osteopenia 10/2017   . 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 Anemia in chronic illness 04/06/2012  . IBS (irritable bowel syndrome) 03/16/2012  . History of rectal cancer, T2N0, Nov2010 03/27/2011  . 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  Takedown 11/23/2019 02/03/2011  . History of kidney stones 2011   PCP:  Carol Ada, MD Pharmacy:   [facility], [place] [address] Phone: [phone] Fax: [phone]     Social Determinants of Health (SDOH) Interventions    Readmission Risk Interventions Readmission Risk Prevention Plan 11/29/2019  Transportation Screening Complete  Some recent data might be hidden

## 2019-11-30 ENCOUNTER — Inpatient Hospital Stay (HOSPITAL_COMMUNITY): Payer: BC Managed Care – PPO

## 2019-11-30 LAB — CBC
HCT: 32.6 % — ABNORMAL LOW (ref 36.0–46.0)
Hemoglobin: 10.4 g/dL — ABNORMAL LOW (ref 12.0–15.0)
MCH: 31 pg (ref 26.0–34.0)
MCHC: 31.9 g/dL (ref 30.0–36.0)
MCV: 97 fL (ref 80.0–100.0)
Platelets: 484 10*3/uL — ABNORMAL HIGH (ref 150–400)
RBC: 3.36 MIL/uL — ABNORMAL LOW (ref 3.87–5.11)
RDW: 13.7 % (ref 11.5–15.5)
WBC: 11.9 10*3/uL — ABNORMAL HIGH (ref 4.0–10.5)
nRBC: 0 % (ref 0.0–0.2)

## 2019-11-30 LAB — POTASSIUM: Potassium: 4 mmol/L (ref 3.5–5.1)

## 2019-11-30 LAB — CREATININE, SERUM
Creatinine, Ser: 0.43 mg/dL — ABNORMAL LOW (ref 0.44–1.00)
GFR calc Af Amer: >60 mL/min (ref >60)
GFR calc non Af Amer: >60 mL/min (ref >60)

## 2019-11-30 MED ORDER — ACETAMINOPHEN 500 MG PO TABS: 500.0000 mg | ORAL_TABLET | Freq: Four times a day (QID) | ORAL | Status: DC | PRN

## 2019-11-30 MED ORDER — METOPROLOL TARTRATE 12.5 MG HALF TABLET
12.5000 mg | ORAL_TABLET | Freq: Every day | ORAL | Status: DC
  Administered 2019-11-30 – 2019-12-05 (×6): 12.5 mg via ORAL
  Filled 2019-11-30 (×6): qty 1

## 2019-11-30 MED ORDER — HYDROCODONE-ACETAMINOPHEN 5-325 MG PO TABS: 1.0000 | ORAL_TABLET | ORAL | Status: DC | PRN

## 2019-11-30 MED ORDER — IOHEXOL 9 MG/ML PO SOLN
500.0000 mL | ORAL | Status: AC
  Administered 2019-11-30: 500 mL via ORAL

## 2019-11-30 MED ORDER — SERTRALINE HCL 25 MG PO TABS
25.0000 mg | ORAL_TABLET | Freq: Every day | ORAL | Status: DC
  Administered 2019-11-30 – 2019-12-05 (×6): 25 mg via ORAL
  Filled 2019-11-30 (×6): qty 1

## 2019-11-30 MED ORDER — CALCIUM POLYCARBOPHIL 625 MG PO TABS
625.0000 mg | ORAL_TABLET | Freq: Every day | ORAL | Status: DC
  Administered 2019-11-30: 625 mg via ORAL
  Filled 2019-11-30 (×2): qty 1

## 2019-11-30 MED ORDER — TAB-A-VITE/IRON PO TABS
1.0000 | ORAL_TABLET | Freq: Every day | ORAL | Status: DC
  Administered 2019-11-30 – 2019-12-05 (×6): 1 via ORAL
  Filled 2019-11-30 (×6): qty 1

## 2019-11-30 MED ORDER — IOHEXOL 300 MG/ML  SOLN
100.0000 mL | Freq: Once | INTRAMUSCULAR | Status: AC | PRN
  Administered 2019-11-30: 100 mL via INTRAVENOUS

## 2019-11-30 MED ORDER — IOHEXOL 9 MG/ML PO SOLN
ORAL | Status: AC
  Filled 2019-11-30: qty 1000

## 2019-11-30 NOTE — Progress Notes (Signed)
Emily Holloway MR:[id number] 1955/12/03  CARE TEAM:  PCP: Carol Ada, MD  Outpatient Care Team: Patient Care Team: Carol Ada, MD as PCP - General (Family Medicine) Gatha Mayer, MD as Consulting Physician (Gastroenterology) Michael Boston, MD (General Surgery) Bjorn Loser, MD as Consulting Physician (Urology) Karle Starch, MD as Consulting Physician (Medical Oncology) Cheryll Cockayne, MD as Consulting Physician (Colon and Rectal Surgery) Aviva Signs, MD as Consulting Physician (Gastroenterology)  Inpatient Treatment Team: Treatment Team: Attending Provider: Michael Boston, MD; Technician: Berenice Bouton, NT; Registered Nurse: Sibyl Parr, RN; Utilization Review: Sindy Guadeloupe, RN; Occupational Therapist: Rosemary Holms, OT; Technician: Leda Quail, NT; Social Worker: Lennart Pall R, [place]   Problem List:   Principal Problem:   Recurrent parastomal hernia s/p ileostomy takedown 11/23/2019 Active Problems:   Recurrent rectovaginal fistula, 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  Takedown 11/23/2019   History of rectal cancer, T2N0, Nov2010   IBS (irritable bowel syndrome)   Anemia in chronic illness   Osteopenia   History of kidney stones   Depressed affect   Obesity (BMI 30-39.9)   7 Days Post-Op  11/23/2019  POST-OPERATIVE DIAGNOSIS:   PARASTOMAL HERNIA  AT END ILEOSTOMY HISTORY OF RECTOVAGINAL FISTULA HISTORY OF RECTAL CANCER  PROCEDURE:   XI ROBOTIC END ILEOSTOMY TAKEDOWN - INTRACORPOREAL ANASTOMOSIS ROBOTIC LYSIS OF ADHESIONS x3 HOURS ILEAL RESECTION (ILEOSTOMY) OMENTOPEXY BILATERAL TAP BLOCK PRIMARY PARASTOMAL HERNIA REPAIR COLONOSCOPY (See Dr Orest Dikes separate OR note)  SURGEON:  Adin Hector, MD  ASSISTANTS:  Nadeen Landau, MD Fran Lowes, PA-S, [facility] An experienced assistant was required given the standard of surgical care given the complexity of the case.  This  assistant was needed for exposure, dissection, suctioning, retraction, instrument exchange, etc.     Assessment  Slowly recovering  [facility] Stay = 7 days)  Plan: CT scan abd/pelvis r/o abscess/SBO  Pain control with nonnarcotic means.  Try and mobilize more. - she did more but is still rather resistent  Zofran PRN.  Simethicone and Reglan for bloating and discomfort.  See if that can help.  Bowel regimen.  Seems better w less bloating.  Depressed affect - try low dose Zoloft.  S/p IV iron for acute postoperative on chronic anemia.  PO MVI w iron Hold off on transfusion.  Tachycardia resolved without shock. Decreasing metoprolol  Weaned off oxygen.  No electrolyte abnormalities.  Follow.  20 minutes spent in review, evaluation, examination, counseling, and coordination of care.  More than 50% of that time was spent in counseling.  D/C patient from hospital when patient meets criteria (anticipate in 1-2 day(s)):  Tolerating oral intake well Ambulating well Adequate pain control without IV medications Urinating  Having flatus Disposition planning in place - if not better, may need SNF.  See if HH an option   11/30/2019    Subjective: PO intake fair Nausea - IV Zofran helped Walked a little  Objective:  Vital signs:  Vitals:   11/29/19 2210 11/30/19 0156 11/30/19 0523 11/30/19 1036  BP: 120/69 122/69 133/80 131/77  Pulse: 95 96 93 97  Resp: 18 20 20    Temp:  98.7 F (37.1 C) 97.6 F (36.4 C)   TempSrc:  Oral Oral   SpO2: 94% 94% 92% 96%  Weight:      Height:        Last BM Date: 11/29/19  Intake/Output   Yesterday:  05/04 0701 - 05/05 0700 In: [id number] [P.O.:1177] Out: 1300 [Urine:1300] This shift:  Total I/O In: 120 [P.O.:120] Out: 100 [Urine:100]  Bowel function:  Flatus: YES  BM:  YES  Drain: (No drain)   Physical Exam:  General: Pt awake/alert in no acute distress.  Tired but not toxic. Eyes: PERRL, normal EOM.  Sclera clear.   No icterus Neuro: CN II-XII intact w/o focal sensory/motor deficits. Lymph: No head/neck/groin lymphadenopathy Psych:  No delerium/psychosis/paranoia.  Oriented x 4 HENT: Normocephalic, Mucus membranes moist.  No thrush Neck: Supple, No tracheal deviation.  No obvious thyromegaly Chest: No pain to chest wall compression.  Good respiratory excursion.  No audible wheezing CV:  Pulses intact.  Regular rhythm.  No major extremity edema MS: Normal AROM mjr joints.  No obvious deformity  Abdomen: Soft.  Mildy distended.  Incisions clean dry intact.  Mild discomfort at old left lower quadrant ileostomy/parastomal hernia incision.  No cellulitis or abscess.  No evidence of peritonitis.  No incarcerated hernias.  Ext:  No deformity.  No mjr edema.  No cyanosis Skin: No petechiae / purpurea.  No major sores.  Warm and dry    Results:   PATHOLOGY  SURGICAL PATHOLOGY  CASE: [id number]  PATIENT: Emily Holloway  Surgical Pathology Report      Clinical History: Parastomal hernia at end ileostomy (crm)      FINAL MICROSCOPIC DIAGNOSIS:   A. ILEOSTOMY:  - Ileostomy with mild inflammation.  - No evidence of malignancy.    Antonious Omahoney DESCRIPTION:   Received fresh are 2 segments of adherent bowel, 7 and 8 cm in length  and averaging 2.5 cm in diameter, joint due to a single portion of bowel  1 end and stapled at the opposite end. There is also a 6 x 3.5 x 1 cm  portion of adherent membranous tissue encasing adipose tissue. Upon  opening, mucosa is tan-pink with unremarkable mucosal folding. There is  a thin rim of possible skin at one end. Wall thickness averages 0.4 cm.  Discrete lesions are grossly absent. Representative sections submitted  in 1 cassette. (AK 11/24/19)     Final Diagnosis performed by Claudette Laws, MD.  Electronically signed  11/25/2019  Technical and / or Professional components performed at [facility], [place] [address],  [place], [place] [address].  Immunohistochemistry Technical component (if applicable) was performed  at [facility]. [address], [place],  [place], [place] [address].  IMMUNOHISTOCHEMISTRY DISCLAIMER (if applicable):  Some of these immunohistochemical stains may have been developed and the  performance characteristics determine by [facility]. Some  may not have been cleared or approved by the U.S. Food and Drug  Administration. The FDA has determined that such clearance or approval  is not necessary. This test is used for clinical purposes. It should not  be regarded as investigational or for research. This laboratory is  certified under the [place]  (CLIA-88) as qualified to perform high complexity clinical laboratory  testing. The controls stained appropriately.   Cultures: Recent Results (from the past 720 hour(s))  SARS CORONAVIRUS 2 (TAT 6-24 HRS) Nasopharyngeal Nasopharyngeal Swab     Status: None   Collection Time: 11/19/19  1:32 PM   Specimen: Nasopharyngeal Swab  Result Value Ref Range Status   SARS Coronavirus 2 NEGATIVE NEGATIVE Final    Comment: (NOTE) SARS-CoV-2 target nucleic acids are NOT DETECTED. The SARS-CoV-2 RNA is generally detectable in upper and lower respiratory specimens during the acute phase of infection. Negative results do not preclude SARS-CoV-2 infection,  do not rule out co-infections with other pathogens, and should not be used as the sole basis for treatment or other patient management decisions. Negative results must be combined with clinical observations, patient history, and epidemiological information. The expected result is Negative. Fact Sheet for Patients: SugarRoll.be Fact Sheet for Healthcare Providers: https://www.woods-mathews.com/ This test is not yet approved or cleared by the [place] FDA and  has been authorized  for detection and/or diagnosis of SARS-CoV-2 by FDA under an Emergency Use Authorization (EUA). This EUA will remain  in effect (meaning this test can be used) for the duration of the COVID-19 declaration under Section 56 4(b)(1) of the Act, 21 U.S.C. section 360bbb-3(b)(1), unless the authorization is terminated or revoked sooner. Performed at [facility], [place] [address] [place], [place] [address]     Labs: Results for orders placed or performed during the hospital encounter of 11/23/19 (from the past 48 hour(s))  CBC     Status: Abnormal   Collection Time: 11/30/19  4:30 AM  Result Value Ref Range   WBC 11.9 (H) 4.0 - 10.5 K/uL   RBC 3.36 (L) 3.87 - 5.11 MIL/uL   Hemoglobin 10.4 (L) 12.0 - 15.0 g/dL   HCT 32.6 (L) 36.0 - 46.0 %   MCV 97.0 80.0 - 100.0 fL   MCH 31.0 26.0 - 34.0 pg   MCHC 31.9 30.0 - 36.0 g/dL   RDW 13.7 11.5 - 15.5 %   Platelets 484 (H) 150 - 400 K/uL   nRBC 0.0 0.0 - 0.2 %    Comment: Performed at [facility], [place] [address] [place], [place] [address]  Creatinine, serum     Status: Abnormal   Collection Time: 11/30/19  4:30 AM  Result Value Ref Range   Creatinine, Ser 0.43 (L) 0.44 - 1.00 mg/dL   GFR calc non Af Amer >60 >60 mL/min   GFR calc Af Amer >60 >60 mL/min    Comment: Performed at [facility], [place] [address], [place], [place] [address]  Potassium     Status: None   Collection Time: 11/30/19  4:30 AM  Result Value Ref Range   Potassium 4.0 3.5 - 5.1 mmol/L    Comment: Performed at [facility], [place] [address], [place], [place] [address]    Imaging / Studies: No results found.  Medications / Allergies: per chart  Antibiotics: Anti-infectives (From admission, onward)   Start     Dose/Rate Route Frequency Ordered Stop   11/24/19 0200  cefoTEtan (CEFOTAN) 2 g in sodium chloride 0.9 % 100 mL IVPB     2 g 200 mL/hr over 30 Minutes Intravenous Every 12 hours 11/23/19 1646  11/24/19 0148   11/23/19 0645  cefoTEtan (CEFOTAN) 2 g in sodium chloride 0.9 % 100 mL IVPB  Status:  Discontinued     2 g 200 mL/hr over 30 Minutes Intravenous On call to O.R. 11/23/19 [id number] 11/23/19 1646        Note: Portions of this report may have been transcribed using voice recognition software. Every effort was made to ensure accuracy; however, inadvertent computerized transcription errors may be present.   Any transcriptional errors that result from this process are unintentional.     Adin Hector, MD, FACS, MASCRS Gastrointestinal and Minimally Invasive Surgery  [facility] [address], [place], [place] [address] [phone] Fax [phone] Main/Paging  CONTACT INFORMATION: Weekday (9AM-5PM) concerns: Call [facility] main office at  [phone] Weeknight (5PM-9AM) or Weekend/Holiday concerns: Check [email or web address] for General Surgery [facility] coverage (Please, do not use SecureChat as it is not reliable communication to surgeons for patient care)

## 2019-11-30 NOTE — Progress Notes (Signed)
Occupational Therapy Treatment Patient Details Name: Emily Holloway MRN: [id number] DOB: 06-Feb-1956 Today's Date: 11/30/2019    History of present illness Pt presents with Parastomal hernia- s/p end ileostomy takedown, parastomal hernia repair   OT comments  With encouragement patient agreeable to standing at sink side for grooming/hygiene, supervision for safety. Patient participate in functional ambulation with rolling walker, then request to use bathroom prior to returning to bed "I've been up in the chair a lot this morning." Patient supervision for safety to transfer to toilet, pt requiring increased time and RN present to provide medications therefore pt left on toilet with nurse to assist when finished. Patient with decreased activity tolerance requiring increased time for self care, mobility. Will continue to follow.  Follow Up Recommendations  Home health OT;Supervision/Assistance - 24 hour    Equipment Recommendations  3 in 1 bedside commode       Precautions / Restrictions Precautions Precautions: Fall Restrictions Weight Bearing Restrictions: No       Mobility Bed Mobility Overal bed mobility: Needs Assistance Bed Mobility: Rolling;Sidelying to Sit Rolling: Supervision Sidelying to sit: Supervision       General bed mobility comments: no physical assist required  Transfers Overall transfer level: Needs assistance Equipment used: Rolling walker (2 wheeled) Transfers: Sit to/from Stand Sit to Stand: Supervision         General transfer comment: increased time to rise from EOB    Balance Overall balance assessment: Needs assistance Sitting-balance support: Feet supported;No upper extremity supported Sitting balance-Leahy Scale: Fair     Standing balance support: Bilateral upper extremity supported;During functional activity Standing balance-Leahy Scale: Poor Standing balance comment: reliant on UEs                           ADL  either performed or assessed with clinical judgement   ADL Overall ADL's : Needs assistance/impaired     Grooming: Oral care;Supervision/safety;Standing                   Toilet Transfer: Supervision/safety;Ambulation;RW;Regular Toilet;Grab bars           Functional mobility during ADLs: Supervision/safety;Rolling walker General ADL Comments: nurse present to assist patient back to bed after using bathroom. patient requires increased time for all mobility and self care due to limited activity tolerance               Cognition Arousal/Alertness: Awake/alert Behavior During Therapy: Flat affect Overall Cognitive Status: Within Functional Limits for tasks assessed                                                     Pertinent Vitals/ Pain       Pain Assessment: Faces Faces Pain Scale: Hurts little more Pain Location: abd Pain Descriptors / Indicators: Sore Pain Intervention(s): Monitored during session         Frequency  Min 2X/week        Progress Toward Goals  OT Goals(current goals can now be found in the care plan section)  Progress towards OT goals: Progressing toward goals  Acute Rehab OT Goals Patient Stated Goal: get well OT Goal Formulation: With patient Time For Goal Achievement: 12/03/19 Potential to Achieve Goals: Good ADL Goals Pt Will Perform Lower Body Dressing: with modified independence;sit to/from stand Pt  Will Transfer to Toilet: with modified independence;regular height toilet Pt Will Perform Toileting - Clothing Manipulation and hygiene: with modified independence;sit to/from stand  Plan Discharge plan remains appropriate       AM-PAC OT "6 Clicks" Daily Activity     Outcome Measure   Help from another person eating meals?: None Help from another person taking care of personal grooming?: A Little Help from another person toileting, which includes using toliet, bedpan, or urinal?: A Little Help from  another person bathing (including washing, rinsing, drying)?: A Little Help from another person to put on and taking off regular upper body clothing?: None Help from another person to put on and taking off regular lower body clothing?: A Little 6 Click Score: 20    End of Session Equipment Utilized During Treatment: Rolling walker  OT Visit Diagnosis: Unsteadiness on feet (R26.81);Muscle weakness (generalized) (M62.81)   Activity Tolerance Patient tolerated treatment well   Patient Left with nursing/sitter in room   Nurse Communication Mobility status        Time: 1010-1029 OT Time Calculation (min): 19 min  Charges: OT General Charges $OT Visit: 1 Visit OT Treatments $Self Care/Home Management : 8-22 mins  Delbert Phenix OT Pager: [place] 11/30/2019, 1:29 PM

## 2019-11-30 NOTE — Progress Notes (Signed)
Orders placed for CT scan, CT asked that a new IV be placed before coming to CT.  IV team consult placed, stated that they could not come for hours to please have a RN try.  Unsuccessful attempt x 2, informed CT, asked if they could place, states" if yall cant get it, then we probably cant get it"  We can wait until IV team can attempt, new order placed for IV consult

## 2019-12-01 DIAGNOSIS — K567 Ileus, unspecified: Secondary | ICD-10-CM

## 2019-12-01 DIAGNOSIS — K6389 Other specified diseases of intestine: Secondary | ICD-10-CM

## 2019-12-01 LAB — CBC
HCT: 32.6 % — ABNORMAL LOW (ref 36.0–46.0)
Hemoglobin: 10.1 g/dL — ABNORMAL LOW (ref 12.0–15.0)
MCH: 30.7 pg (ref 26.0–34.0)
MCHC: 31 g/dL (ref 30.0–36.0)
MCV: 99.1 fL (ref 80.0–100.0)
Platelets: 534 10*3/uL — ABNORMAL HIGH (ref 150–400)
RBC: 3.29 MIL/uL — ABNORMAL LOW (ref 3.87–5.11)
RDW: 13.9 % (ref 11.5–15.5)
WBC: 11.8 10*3/uL — ABNORMAL HIGH (ref 4.0–10.5)
nRBC: 0.2 % (ref 0.0–0.2)

## 2019-12-01 LAB — MAGNESIUM: Magnesium: 1.9 mg/dL (ref 1.7–2.4)

## 2019-12-01 LAB — CREATININE, SERUM
Creatinine, Ser: 0.48 mg/dL (ref 0.44–1.00)
GFR calc Af Amer: >60 mL/min (ref >60)
GFR calc non Af Amer: >60 mL/min (ref >60)

## 2019-12-01 LAB — POTASSIUM: Potassium: 3.6 mmol/L (ref 3.5–5.1)

## 2019-12-01 MED ORDER — POTASSIUM CHLORIDE 10 MEQ/100ML IV SOLN
10.0000 meq | INTRAVENOUS | Status: AC
  Administered 2019-12-01 (×4): 10 meq via INTRAVENOUS
  Filled 2019-12-01 (×4): qty 100

## 2019-12-01 MED ORDER — ENSURE ENLIVE PO LIQD
237.0000 mL | Freq: Two times a day (BID) | ORAL | Status: DC
  Administered 2019-12-02: 237 mL via ORAL

## 2019-12-01 MED ORDER — LACTATED RINGERS IV SOLN: INTRAVENOUS | Status: DC

## 2019-12-01 MED ORDER — KCL IN DEXTROSE-NACL 40-5-0.45 MEQ/L-%-% IV SOLN
INTRAVENOUS | Status: DC
  Filled 2019-12-01 (×4): qty 1000

## 2019-12-01 MED ORDER — POLYETHYLENE GLYCOL 3350 17 G PO PACK: 17.0000 g | PACK | Freq: Every day | ORAL | Status: DC

## 2019-12-01 MED ORDER — LACTATED RINGERS IV BOLUS
1000.0000 mL | Freq: Once | INTRAVENOUS | Status: AC
  Administered 2019-12-01: 1000 mL via INTRAVENOUS

## 2019-12-01 MED ORDER — PIPERACILLIN-TAZOBACTAM 3.375 G IVPB
3.3750 g | Freq: Three times a day (TID) | INTRAVENOUS | Status: DC
  Administered 2019-12-01 – 2019-12-05 (×13): 3.375 g via INTRAVENOUS
  Filled 2019-12-01 (×14): qty 50

## 2019-12-01 MED ORDER — SACCHAROMYCES BOULARDII 250 MG PO CAPS
250.0000 mg | ORAL_CAPSULE | Freq: Two times a day (BID) | ORAL | Status: DC
  Administered 2019-12-01 – 2019-12-04 (×7): 250 mg via ORAL
  Filled 2019-12-01 (×7): qty 1

## 2019-12-01 MED ORDER — ENSURE MAX PROTEIN PO LIQD
11.0000 [oz_av] | Freq: Every day | ORAL | Status: DC
  Administered 2019-12-01 – 2019-12-05 (×3): 11 [oz_av] via ORAL
  Filled 2019-12-01 (×2): qty 330

## 2019-12-01 NOTE — Progress Notes (Signed)
Nutrition Follow-up  DOCUMENTATION CODES:   Obesity unspecified  INTERVENTION:  Discontinue Ensure Surgery Discontinue Calorie Count Ensure Max po daily, each supplement provides 150 kcal and 30 grams of protein Ensure Enlive po BID, each supplement provides 350 kcal and 20 grams of protein (chocolate) Continue Prostat BID    NUTRITION DIAGNOSIS:   Inadequate oral intake related to poor appetite as evidenced by meal completion < 50%(abdominal bloating/discomfort per pt report). Ongoing     GOAL:   Patient will meet greater than or equal to 90% of their needs    MONITOR:   Labs, I & O's, PO intake, Weight trends, Supplement acceptance, Skin  REASON FOR ASSESSMENT:   Consult Calorie Count  ASSESSMENT:   64 year old female with past medical history of rectal cancer s/p laparoscopic low anterior resection with J pouch coloanal anastomosis and loop ileostomy diversion November 2010, rectovaginal fistula, parastomal hernia s/p primary repair in 2013,  lung cancer s/p adjuvant chemotherapy presented for post-op hernia surgery on 4/28 s/p emergent lysis of adhesions with reduction of incarcerated incisional hernia and ileostomy on 03/17/20.  4/28 CLD 4/29 NPO/DYS 1 4/30 CLD 5/02 DYS 1 5/03 Soft/HH 5/04 48 hr calorie count initiated 5/05 diet advanced to regular 5/06 FLD/CLD  Day 1 results on 5/4: 929 kcal and 45 grams of protein (49% of minimum kcal needs, 45% of minimum protein needs)  No additional documented po intakes per flowsheets, no meal tickets in room for review today. Unable to provide day 2 results.   Patient awake and alert this afternoon, states that she is not having a good day, denies N/V. Patient endorses drinking prostat supplement, states that she would drink the Ensure supplement if she could have it in chocolate, but is continuously brought the vanilla flavor. RD educated on importance of calories and protein to promote post-op healing, encouraging  intake of meals and supplements. Patient's diet was changed from full liquids to clear liquids today at 0721, noted ok to have any thin liquid and milk/dairy to try. Will discontinue Ensure surgery and provide chocolate Ensure Enlive BID as well as Ensure Max daily.  Medications reviewed and include: Lactinex, MVI with iron, Florastor, Zoloft D5 and NaCl with KCl 40 mEq Zosyn Labs reviewed   Diet Order:   Diet Order            Diet clear liquid Room service appropriate? Yes; Fluid consistency: Thin  Diet effective now              EDUCATION NEEDS:   No education needs have been identified at this time  Skin:  Skin Assessment: Skin Integrity Issues: Skin Integrity Issues:: Incisions Incisions: closed; abdomen  Last BM:  5/4 type 7  Height:   Ht Readings from Last 1 Encounters:  11/23/19 5\' 2"  (1.575 m)    Weight:   Wt Readings from Last 1 Encounters:  12/01/19 74.3 kg   BMI:  Body mass index is 29.94 kg/m.  Estimated Nutritional Needs:   Kcal:  1900-2100  Protein:  100-115  Fluid:  >/= 1.9 L/day   Lajuan Lines, RD, LDN Clinical Nutrition After Hours/Weekend Pager # in [place]

## 2019-12-01 NOTE — Progress Notes (Signed)
Emily Holloway [id number] 1956/03/26  CARE TEAM:  PCP: Carol Ada, MD  Outpatient Care Team: Patient Care Team: Carol Ada, MD as PCP - General (Family Medicine) Gatha Mayer, MD as Consulting Physician (Gastroenterology) Michael Boston, MD (General Surgery) Bjorn Loser, MD as Consulting Physician (Urology) Karle Starch, MD as Consulting Physician (Medical Oncology) Cheryll Cockayne, MD as Consulting Physician (Colon and Rectal Surgery) Aviva Signs, MD as Consulting Physician (Gastroenterology)  Inpatient Treatment Team: Treatment Team: Attending Provider: Michael Boston, MD; Technician: Berenice Bouton, NT; Registered Nurse: Sibyl Parr, RN; Technician: Leda Quail, NT; Utilization Review: Sindy Guadeloupe, RN; Physical Therapist: Mathis Fare, PT   Problem List:   Principal Problem:   Recurrent parastomal hernia s/p ileostomy takedown 11/23/2019 Active Problems:   Recurrent rectovaginal fistula, 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  Takedown 11/23/2019   History of rectal cancer, T2N0, Nov2010   IBS (irritable bowel syndrome)   Anemia in chronic illness   Osteopenia   History of kidney stones   Depressed affect   Obesity (BMI 30-39.9)   8 Days Post-Op  11/23/2019  Procedure(s): XI ROBOTIC END ILEOSTOMY TAKEDOWN, INTRACORPOREAL ANASTOMOSIS, OMENTOPEXY, BILATERAL TAP BLOCK COLONOSCOPY PARASTOMAL HERNIA REPAIR LYSIS OF ADHESIONS X3 HOURS   SURGEON:  Adin Hector, MD   ASSISTANTS:  Nadeen Landau, MD Fran Lowes, PA-S, [facility] An experienced assistant was required given the standard of surgical care given the complexity of the case.  This assistant was needed for exposure, dissection, suctioning, retraction, instrument   Assessment  POD7 s/p Ileostomy takedown and parastomal hernia repair Slowly improving  [facility] Stay = 8 days)  Plan:  -CT scan w/possible pneumonitis.  Start IV Zosyn  -Bowel rest. Back to clear liquid diet. Start IVF. NG tube not indicated at this time.  -Continue to try and mobilize more  -Zofran PRN. Continue Simethicone and Reglan for bloating. Bloating has improved.   -Patient does not want to take Miralax at this time. We will hold off for now.   -Continue Zoloft  -S/p IV iron for acute postoperative on chronic anemia.  PO MVI w iron. Hold off on transfusion.  -VTE prophylaxis- SCDs, etc  -Tachycardia resolved without shock. Decreasing metoprolol  -Weaned off oxygen.  -No electrolyte abnormalities.  Follow.  -Pain control with po non-narcotics.   20 minutes spent in review, evaluation, examination, counseling, and coordination of care.  More than 50% of that time was spent in counseling.  D/C patient from hospital when patient meets criteria (anticipate in 3-7 day(s)):  Tolerating oral intake well Ambulating well Adequate pain control without IV medications Urinating  Having flatus Disposition planning in place   12/01/2019    Subjective: 1 Episode of emesis yesterday with oral contrast. Nausea improved with Zofran.   Walked yesterday AM  Does not want to take Miralax  Concerned about not having support at home during the day upon discharge  Objective:  Vital signs:  Vitals:   11/30/19 2029 12/01/19 0646 12/01/19 0655 12/01/19 0707  BP: 132/75 132/84  128/76  Pulse: 90 (!) 110  95  Resp: 20 20  (!) 22  Temp: 98.1 F (36.7 C) 98 F (36.7 C)  97.6 F (36.4 C)  TempSrc: Oral Oral  Oral  SpO2: 94% 94%  95%  Weight:   74.3 kg   Height:        Last BM Date: 11/29/19  Intake/Output   Yesterday:  05/05 0701 - 05/06 0700 In: 120 [P.O.:120]  Out: 175 [Urine:100; Emesis/NG output:75] This shift:  No intake/output data recorded.  Bowel function:  Flatus: YES   BM:  YES  Drain: (No drain)   Physical Exam:  General: Pt awake/alert in no acute distress. Depressed mood. Eyes: PERRL, normal  EOM.  Sclera clear.  No icterus Neuro: CN II-XII intact w/o focal sensory/motor deficits. Lymph: No head/neck/groin lymphadenopathy Psych:  No delerium/psychosis/paranoia.  Oriented x 4 HENT: Normocephalic, Mucus membranes moist.  No thrush Neck: Supple, No tracheal deviation.  No obvious thyromegaly Chest: No pain to chest wall compression.  Good respiratory excursion.  No audible wheezing CV:  Pulses intact.  Regular rhythm.  No major extremity edema MS: Normal AROM mjr joints.  No obvious deformity  Abdomen: Somewhat firm.  Mildy distended.  Nontender.  No evidence of peritonitis.  No incarcerated hernias.  Ext: No deformity.  No mjr edema.  No cyanosis Skin: No petechiae / purpurea.  No major sores.  Warm and dry. Incisions clean/dry/intact.     Results:   PATHOLOGY   SURGICAL PATHOLOGY  CASE: [id number]  PATIENT: Emily Holloway  Surgical Pathology Report      Clinical History: Parastomal hernia at end ileostomy (crm)      FINAL MICROSCOPIC DIAGNOSIS:   A. ILEOSTOMY:  - Ileostomy with mild inflammation.  - No evidence of malignancy.    GROSS DESCRIPTION:   Received fresh are 2 segments of adherent bowel, 7 and 8 cm in length  and averaging 2.5 cm in diameter, joint due to a single portion of bowel  1 end and stapled at the opposite end.  There is also a 6 x 3.5 x 1 cm  portion of adherent membranous tissue encasing adipose tissue.  Upon  opening, mucosa is tan-pink with unremarkable mucosal folding.  There is  a thin rim of possible skin at one end.  Wall thickness averages 0.4 cm.  Discrete lesions are grossly absent.  Representative sections submitted  in 1 cassette.  (AK 11/24/19)     Final Diagnosis performed by Claudette Laws, MD.   Electronically signed  11/25/2019  Technical and / or Professional components performed at [facility], [place] [address], [place], [place] [address].   Immunohistochemistry Technical component (if  applicable) was performed  at [facility]. [address], [place],  [place], [place] [address].   IMMUNOHISTOCHEMISTRY DISCLAIMER (if applicable):  Some of these immunohistochemical stains may have been developed and the  performance characteristics determine by [facility]. Some  may not have been cleared or approved by the U.S. Food and Drug  Administration. The FDA has determined that such clearance or approval  is not necessary. This test is used for clinical purposes. It should not  be regarded as investigational or for research. This laboratory is  certified under the [place]  (CLIA-88) as qualified to perform high complexity clinical laboratory  testing.  The controls stained appropriately.   Cultures: Recent Results (from the past 720 hour(s))  SARS CORONAVIRUS 2 (TAT 6-24 HRS) Nasopharyngeal Nasopharyngeal Swab     Status: None   Collection Time: 11/19/19  1:32 PM   Specimen: Nasopharyngeal Swab  Result Value Ref Range Status   SARS Coronavirus 2 NEGATIVE NEGATIVE Final    Comment: (NOTE) SARS-CoV-2 target nucleic acids are NOT DETECTED. The SARS-CoV-2 RNA is generally detectable in upper and lower respiratory specimens during the acute phase of infection. Negative results do not preclude SARS-CoV-2 infection, do not  rule out co-infections with other pathogens, and should not be used as the sole basis for treatment or other patient management decisions. Negative results must be combined with clinical observations, patient history, and epidemiological information. The expected result is Negative. Fact Sheet for Patients: SugarRoll.be Fact Sheet for Healthcare Providers: https://www.woods-mathews.com/ This test is not yet approved or cleared by the [place] FDA and  has been authorized for detection and/or diagnosis of SARS-CoV-2 by FDA under an Emergency  Use Authorization (EUA). This EUA will remain  in effect (meaning this test can be used) for the duration of the COVID-19 declaration under Section 56 4(b)(1) of the Act, 21 U.S.C. section 360bbb-3(b)(1), unless the authorization is terminated or revoked sooner. Performed at [facility], [place] [address], [place], [place] [address]     Labs: Results for orders placed or performed during the hospital encounter of 11/23/19 (from the past 48 hour(s))  CBC     Status: Abnormal   Collection Time: 11/30/19  4:30 AM  Result Value Ref Range   WBC 11.9 (H) 4.0 - 10.5 K/uL   RBC 3.36 (L) 3.87 - 5.11 MIL/uL   Hemoglobin 10.4 (L) 12.0 - 15.0 g/dL   HCT 32.6 (L) 36.0 - 46.0 %   MCV 97.0 80.0 - 100.0 fL   MCH 31.0 26.0 - 34.0 pg   MCHC 31.9 30.0 - 36.0 g/dL   RDW 13.7 11.5 - 15.5 %   Platelets 484 (H) 150 - 400 K/uL   nRBC 0.0 0.0 - 0.2 %    Comment: Performed at [facility], [place] [address], [place], [place] [address]  Creatinine, serum     Status: Abnormal   Collection Time: 11/30/19  4:30 AM  Result Value Ref Range   Creatinine, Ser 0.43 (L) 0.44 - 1.00 mg/dL   GFR calc non Af Amer >60 >60 mL/min   GFR calc Af Amer >60 >60 mL/min    Comment: Performed at [facility], [place] [address], [place], [place] [address]  Potassium     Status: None   Collection Time: 11/30/19  4:30 AM  Result Value Ref Range   Potassium 4.0 3.5 - 5.1 mmol/L    Comment: Performed at [facility], [place] [address], [place], [place] [address]  CBC     Status: Abnormal   Collection Time: 12/01/19  4:29 AM  Result Value Ref Range   WBC 11.8 (H) 4.0 - 10.5 K/uL   RBC 3.29 (L) 3.87 - 5.11 MIL/uL   Hemoglobin 10.1 (L) 12.0 - 15.0 g/dL   HCT 32.6 (L) 36.0 - 46.0 %   MCV 99.1 80.0 - 100.0 fL   MCH 30.7 26.0 - 34.0 pg   MCHC 31.0 30.0 - 36.0 g/dL   RDW 13.9 11.5 - 15.5 %   Platelets 534 (H) 150 - 400 K/uL   nRBC 0.2 0.0 - 0.2 %    Comment: Performed at  [facility], [place] [address], [place], [place] [address]  Creatinine, serum     Status: None   Collection Time: 12/01/19  4:29 AM  Result Value Ref Range   Creatinine, Ser 0.48 0.44 - 1.00 mg/dL   GFR calc non Af Amer >60 >60 mL/min   GFR calc Af Amer >60 >60 mL/min    Comment: Performed at [facility], [place] [address] [place],  [address]  Potassium     Status: None   Collection Time: 12/01/19  4:29 AM  Result Value Ref Range   Potassium 3.6 3.5 - 5.1 mmol/L    Comment: Performed at [facility], [place] [address], [place], [place] [address]  Magnesium     Status: None   Collection Time: 12/01/19  4:29 AM  Result Value Ref Range   Magnesium 1.9 1.7 - 2.4 mg/dL    Comment: Performed at [facility], [place] [address] [place],  [address]    Imaging / Studies: CT ABDOMEN PELVIS W CONTRAST  Result Date: 11/30/2019 CLINICAL DATA:  Fever, abdominal pain, postop ileostomy take down 11/23/2019 EXAM: CT ABDOMEN AND PELVIS WITH CONTRAST TECHNIQUE: Multidetector CT imaging of the abdomen and pelvis was performed using the standard protocol following bolus administration of intravenous contrast. CONTRAST:  171mL OMNIPAQUE IOHEXOL 300 MG/ML  SOLN COMPARISON:  10/29/2019 FINDINGS: Lower chest: Hypoventilatory changes are seen at the lung bases, with bilateral lower lobe atelectasis. There are trace bilateral pleural effusions. There is minimal gas within the epiphrenic fat consistent with recent laparoscopic procedure. Hepatobiliary: No focal liver abnormality is seen. No gallstones, gallbladder wall thickening, or biliary dilatation. Pancreas: Unremarkable. No pancreatic ductal dilatation or surrounding inflammatory changes. Spleen: Normal in size without focal abnormality. Adrenals/Urinary Tract: Adrenal glands are unremarkable. Kidneys are normal, without renal calculi, focal lesion, or hydronephrosis. Bladder is  unremarkable. Stomach/Bowel: There is diffuse gaseous distension of the large and small bowel likely representing postoperative ileus. Gas fluid levels are seen within the small bowel and colon, to the level of the splenic flexure. There is wall thickening of the cecum at the site of the ileocolic anastomosis. Punctate foci of gas within the non dependent colonic wall consistent with pneumatosis, nonspecific given recent surgical intervention. Continued follow-up is recommended to exclude underlying ischemia. There is no evidence of anastomotic breakdown, fluid collection, or abscess. Vascular/Lymphatic: Aortic atherosclerosis. No enlarged abdominal or pelvic lymph nodes. Reproductive: Uterus and bilateral adnexa are unremarkable. Other: Postsurgical changes are seen from ileostomy takedown and parastomal hernia repair. No evidence of residual or recurrent ventral hernia. There is subcutaneous fat stranding within the anterior abdominal wall, with punctate foci of subcutaneous gas, consistent with recent surgical intervention. No fluid collection or abscess. There is trace free fluid within the lower abdomen and pelvis. No fluid collection or abscess. Musculoskeletal: No acute or destructive bony lesions. Reconstructed images demonstrate no additional findings. IMPRESSION: 1. Postoperative changes from recent laparoscopic surgery, ostomy takedown, and left lower quadrant parastomal hernia repair. 2. Mild wall thickening and pneumatosis within the proximal colon at the site of ileocolic anastomosis. This is a nonspecific finding given recent surgical intervention, and continued imaging follow-up is recommended to exclude underlying ischemia. 3. Diffuse distension of large and small bowel, with scattered gas fluid levels, most consistent with postoperative ileus. 4. No evidence of fluid collection or abscess within the abdomen, pelvis, or abdominal wall. Trace ascites as above. 5. Small bilateral pleural effusions.  Electronically Signed   By: Randa Ngo M.D.   On: 11/30/2019 18:29    Medications / Allergies: per chart  Antibiotics: Anti-infectives (From admission, onward)    Start     Dose/Rate Route Frequency Ordered Stop   12/01/19 0800  piperacillin-tazobactam (ZOSYN) IVPB 3.375 g     3.375 g 12.5 mL/hr over 240 Minutes Intravenous Every 8 hours 12/01/19 0716 12/06/19 0759   11/24/19 0200  cefoTEtan (CEFOTAN) 2 g in sodium chloride 0.9 % 100 mL IVPB     2 g 200 mL/hr over 30  Minutes Intravenous Every 12 hours 11/23/19 1646 11/24/19 0148   11/23/19 0645  cefoTEtan (CEFOTAN) 2 g in sodium chloride 0.9 % 100 mL IVPB  Status:  Discontinued     2 g 200 mL/hr over 30 Minutes Intravenous On call to O.R. 11/23/19 [id number] 11/23/19 1646         Note: Portions of this report may have been transcribed using voice recognition software. Every effort was made to ensure accuracy; however, inadvertent computerized transcription errors may be present.   Any transcriptional errors that result from this process are unintentional.   Orbie Pyo, PA-S Patient was seen and evaluated in coordination with Kandis Ban, MD   12/01/2019  8:11 AM

## 2019-12-01 NOTE — Progress Notes (Signed)
Physical Therapy Treatment Patient Details Name: Emily Holloway MRN: [id number] DOB: 1955-12-24 Today's Date: 12/01/2019    History of Present Illness Pt presents with Parastomal hernia- s/p end ileostomy takedown, parastomal hernia repair    PT Comments    Pt reports fatigue but agreeable to limited mobilization.  Pt up to ambulate 170' in halls with RW and min guard to steady.   Follow Up Recommendations  Home health PT;Supervision - Intermittent     Equipment Recommendations  Rolling walker with 5" wheels    Recommendations for Other Services       Precautions / Restrictions Precautions Precautions: Fall Restrictions Weight Bearing Restrictions: No    Mobility  Bed Mobility Overal bed mobility: Needs Assistance Bed Mobility: Rolling;Sidelying to Sit Rolling: Supervision Sidelying to sit: Supervision       General bed mobility comments: increased time but no physical assist required  Transfers Overall transfer level: Needs assistance Equipment used: Rolling walker (2 wheeled) Transfers: Sit to/from Stand Sit to Stand: Min assist;Min guard         General transfer comment: increased time to rise from EOB; steady assist only  Ambulation/Gait Ambulation/Gait assistance: Min guard Gait Distance (Feet): 170 Feet Assistive device: Rolling walker (2 wheeled) Gait Pattern/deviations: Step-through pattern Gait velocity: decr   General Gait Details: cues for posture and position from RW; distance ltd by fatigue   Stairs             Wheelchair Mobility    Modified Rankin (Stroke Patients Only)       Balance Overall balance assessment: Needs assistance Sitting-balance support: Feet supported;No upper extremity supported Sitting balance-Leahy Scale: Fair     Standing balance support: Bilateral upper extremity supported;During functional activity Standing balance-Leahy Scale: Poor Standing balance comment: reliant on UEs                             Cognition Arousal/Alertness: Awake/alert Behavior During Therapy: Flat affect Overall Cognitive Status: Within Functional Limits for tasks assessed                                        Exercises      General Comments        Pertinent Vitals/Pain Pain Assessment: 0-10 Pain Score: 5  Pain Location: abd Pain Descriptors / Indicators: Sore Pain Intervention(s): Limited activity within patient's tolerance;Monitored during session;Repositioned    Home Living                      Prior Function            PT Goals (current goals can now be found in the care plan section) Acute Rehab PT Goals Patient Stated Goal: get well PT Goal Formulation: With patient Time For Goal Achievement: 12/09/19 Potential to Achieve Goals: Good Progress towards PT goals: Not progressing toward goals - comment(fatigues easily)    Frequency    Min 3X/week      PT Plan Current plan remains appropriate    Co-evaluation              AM-PAC PT "6 Clicks" Mobility   Outcome Measure  Help needed turning from your back to your side while in a flat bed without using bedrails?: A Little Help needed moving from lying on your back to sitting on the side of a  flat bed without using bedrails?: A Little Help needed moving to and from a bed to a chair (including a wheelchair)?: A Little Help needed standing up from a chair using your arms (e.g., wheelchair or bedside chair)?: A Little Help needed to walk in hospital room?: A Little Help needed climbing 3-5 steps with a railing? : A Lot 6 Click Score: 17    End of Session Equipment Utilized During Treatment: Gait belt Activity Tolerance: Patient limited by fatigue Patient left: in chair;with call bell/phone within reach;with chair alarm set Nurse Communication: Mobility status PT Visit Diagnosis: Difficulty in walking, not elsewhere classified (R26.2);Other abnormalities of gait and mobility  (R26.89)     Time: [id number] PT Time Calculation (min) (ACUTE ONLY): 18 min  Charges:  $Gait Training: 8-22 mins                     [place] Pager [phone] Office [phone]    Johanan Skorupski 12/01/2019, 5:00 PM

## 2019-12-02 LAB — CBC
HCT: 31 % — ABNORMAL LOW (ref 36.0–46.0)
Hemoglobin: 9.9 g/dL — ABNORMAL LOW (ref 12.0–15.0)
MCH: 31.4 pg (ref 26.0–34.0)
MCHC: 31.9 g/dL (ref 30.0–36.0)
MCV: 98.4 fL (ref 80.0–100.0)
Platelets: 593 10*3/uL — ABNORMAL HIGH (ref 150–400)
RBC: 3.15 MIL/uL — ABNORMAL LOW (ref 3.87–5.11)
RDW: 14.3 % (ref 11.5–15.5)
WBC: 9.4 10*3/uL (ref 4.0–10.5)
nRBC: 0.2 % (ref 0.0–0.2)

## 2019-12-02 LAB — POTASSIUM: Potassium: 3.4 mmol/L — ABNORMAL LOW (ref 3.5–5.1)

## 2019-12-02 LAB — PREALBUMIN: Prealbumin: 9.1 mg/dL — ABNORMAL LOW (ref 18–38)

## 2019-12-02 MED ORDER — POTASSIUM CHLORIDE CRYS ER 20 MEQ PO TBCR
40.0000 meq | EXTENDED_RELEASE_TABLET | Freq: Every day | ORAL | Status: AC
  Administered 2019-12-02 – 2019-12-03 (×2): 40 meq via ORAL
  Filled 2019-12-02 (×3): qty 2

## 2019-12-02 NOTE — Progress Notes (Addendum)
Emily Holloway [id number] [birth year]  CARE TEAM:  PCP: Carol Ada, MD  Outpatient Care Team: Patient Care Team: Carol Ada, MD as PCP - General (Family Medicine) Gatha Mayer, MD as Consulting Physician (Gastroenterology) Michael Boston, MD (General Surgery) Bjorn Loser, MD as Consulting Physician (Urology) Karle Starch, MD as Consulting Physician (Medical Oncology) Cheryll Cockayne, MD as Consulting Physician (Colon and Rectal Surgery) Aviva Signs, MD as Consulting Physician (Gastroenterology)  Inpatient Treatment Team: Treatment Team: Attending Provider: Michael Boston, MD; Technician: Berenice Bouton, NT; Registered Nurse: Sibyl Parr, RN; Technician: Leda Quail, NT; Registered Nurse: Kai Levins, RN; Utilization Review: Sindy Guadeloupe, RN   Problem List:   Principal Problem:   Recurrent parastomal hernia s/p ileostomy takedown 11/23/2019 Active Problems:   Recurrent rectovaginal fistula, 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  Takedown 11/23/2019   History of rectal cancer, T2N0, Nov2010   IBS (irritable bowel syndrome)   Anemia in chronic illness   Osteopenia   History of kidney stones   Depressed affect   Obesity (BMI 30-39.9)   Pneumatosis coli   Ileus, postoperative ([place])   9 Days Post-Op  11/23/2019  Procedure(s): XI ROBOTIC END ILEOSTOMY TAKEDOWN, INTRACORPOREAL ANASTOMOSIS, OMENTOPEXY, BILATERAL TAP BLOCK COLONOSCOPY PARASTOMAL HERNIA REPAIR LYSIS OF ADHESIONS X3 HOURS  SURGEON: Adin Hector, MD  ASSISTANTS: Nadeen Landau, MD Fran Lowes, PA-S, [facility] An experienced assistant was required given the standard of surgical care given the complexity of the case. This assistant was needed for exposure, dissection, suctioning, retraction, instrument     Assessment  POD8 s/p Ileostomy takedown and parastomal hernia repair Slowly improving  [facility] Stay = 9  days)  Plan:  -Nausea starting to resolve with downgraded diet to clears. Continue clear liquid diet and IVF, advance to Dysphasia 1 as tolerated. NG tube not indicated at this time but if no improvement by Monday, possibly start TNA.   -Continue to try and mobilize more  -Continue IV Zosyn x5 days  -Zofran PRN. Continue Simethicone and Reglan for bloating. Bloating has improved.   -Continue Zoloft  -S/pIV iron for acute postoperative on chronic anemia.PO MVI w iron. Hold off on transfusion.  -VTE prophylaxis- SCDs, etc  -Tachycardia resolved without shock.Decreasing metoprolol  -Weanedoff oxygen.  -No electrolyte abnormalities. Follow.  -Pain control with po non-narcotics.   -Potassium replacement. Try po formulation but if she cannot tolerate it, give IV.   20 minutes spent in review, evaluation, examination, counseling, and coordination of care.  More than 50% of that time was spent in counseling.  D/C patient from hospital when patient meets criteria (anticipate in 1-5 day(s)):  Tolerating oral intake well Ambulating well Adequate pain control without IV medications Urinating  Having flatus Disposition planning in place   12/02/2019    Subjective:  Patient feeling better today, sitting up in chair.  Walked with PT yesterday  Denies vomiting/fever/chills  States that she spoke with a friend from church yesterday, who can help her at home during the day in addition to her husband  Objective:  Vital signs:  Vitals:   12/01/19 0707 12/01/19 1344 12/01/19 2227 12/02/19 0615  BP: 128/76 131/81 129/74 135/77  Pulse: 95 89 89 88  Resp: (!) 22 18 18 19   Temp: 97.6 F (36.4 C) 98.7 F (37.1 C) 98.4 F (36.9 C) 97.8 F (36.6 C)  TempSrc: Oral Oral Oral Oral  SpO2: 95% 95% 93% 93%  Weight:      Height:  Last BM Date: 12/01/19  Intake/Output   Yesterday:  05/06 0701 - 05/07 0700 In: 1506.9 [P.O.:660; I.V.:391.4; IV  Piggyback:455.5] Out: 500 [Urine:500] This shift:  No intake/output data recorded.  Bowel function:  Flatus: YES  BM:  YES  Drain: (No drain)   Physical Exam:  General: Pt awake/alert in no acute distress Eyes: PERRL, normal EOM.  Sclera clear.  No icterus Neuro: CN II-XII intact w/o focal sensory/motor deficits. Lymph: No head/neck/groin lymphadenopathy Psych:  No delerium/psychosis/paranoia.  Oriented x 4 HENT: Normocephalic, Mucus membranes moist.  No thrush Neck: Supple, No tracheal deviation.  No obvious thyromegaly Chest: No pain to chest wall compression.  Good respiratory excursion.  No audible wheezing CV:  Pulses intact.  Regular rhythm.  No major extremity edema MS: Normal AROM mjr joints.  No obvious deformity  Abdomen: Somewhat firm.  Mildy distended.  Mildly tender at incisions only.  No evidence of peritonitis.  No incarcerated hernias.  Ext:  Incision clean/dry/intact/ No deformity.  No mjr edema.  No cyanosis Skin: No petechiae / purpurea.  No major sores.  Warm and dry    Results:   Cultures: Recent Results (from the past 720 hour(s))  SARS CORONAVIRUS 2 (TAT 6-24 HRS) Nasopharyngeal Nasopharyngeal Swab     Status: None   Collection Time: 11/19/19  1:32 PM   Specimen: Nasopharyngeal Swab  Result Value Ref Range Status   SARS Coronavirus 2 NEGATIVE NEGATIVE Final    Comment: (NOTE) SARS-CoV-2 target nucleic acids are NOT DETECTED. The SARS-CoV-2 RNA is generally detectable in upper and lower respiratory specimens during the acute phase of infection. Negative results do not preclude SARS-CoV-2 infection, do not rule out co-infections with other pathogens, and should not be used as the sole basis for treatment or other patient management decisions. Negative results must be combined with clinical observations, patient history, and epidemiological information. The expected result is Negative. Fact Sheet for  Patients: SugarRoll.be Fact Sheet for Healthcare Providers: https://www.woods-mathews.com/ This test is not yet approved or cleared by the [place] FDA and  has been authorized for detection and/or diagnosis of SARS-CoV-2 by FDA under an Emergency Use Authorization (EUA). This EUA will remain  in effect (meaning this test can be used) for the duration of the COVID-19 declaration under Section 56 4(b)(1) of the Act, 21 U.S.C. section 360bbb-3(b)(1), unless the authorization is terminated or revoked sooner. Performed at [facility], [place] [address] [place], [place] [address]     Labs: Results for orders placed or performed during the hospital encounter of 11/23/19 (from the past 48 hour(s))  CBC     Status: Abnormal   Collection Time: 12/01/19  4:29 AM  Result Value Ref Range   WBC 11.8 (H) 4.0 - 10.5 K/uL   RBC 3.29 (L) 3.87 - 5.11 MIL/uL   Hemoglobin 10.1 (L) 12.0 - 15.0 g/dL   HCT 32.6 (L) 36.0 - 46.0 %   MCV 99.1 80.0 - 100.0 fL   MCH 30.7 26.0 - 34.0 pg   MCHC 31.0 30.0 - 36.0 g/dL   RDW 13.9 11.5 - 15.5 %   Platelets 534 (H) 150 - 400 K/uL   nRBC 0.2 0.0 - 0.2 %    Comment: Performed at [facility], [place] [address], [place], [place] [address]  Creatinine, serum     Status: None   Collection Time: 12/01/19  4:29 AM  Result Value Ref Range   Creatinine, Ser 0.48 0.44 - 1.00 mg/dL  GFR calc non Af Amer >60 >60 mL/min   GFR calc Af Amer >60 >60 mL/min    Comment: Performed at [facility], [place] [address], [place], [place] [address]  Potassium     Status: None   Collection Time: 12/01/19  4:29 AM  Result Value Ref Range   Potassium 3.6 3.5 - 5.1 mmol/L    Comment: Performed at [facility], [place] [address], [place], [place] [address]  Magnesium     Status: None   Collection Time: 12/01/19  4:29 AM  Result Value Ref Range   Magnesium 1.9 1.7 - 2.4 mg/dL     Comment: Performed at [facility], [place] [address], [place], [place] [address]  CBC     Status: Abnormal   Collection Time: 12/02/19  4:20 AM  Result Value Ref Range   WBC 9.4 4.0 - 10.5 K/uL   RBC 3.15 (L) 3.87 - 5.11 MIL/uL   Hemoglobin 9.9 (L) 12.0 - 15.0 g/dL   HCT 31.0 (L) 36.0 - 46.0 %   MCV 98.4 80.0 - 100.0 fL   MCH 31.4 26.0 - 34.0 pg   MCHC 31.9 30.0 - 36.0 g/dL   RDW 14.3 11.5 - 15.5 %   Platelets 593 (H) 150 - 400 K/uL   nRBC 0.2 0.0 - 0.2 %    Comment: Performed at [facility], [place] [address] [place], [place] [address]  Potassium     Status: Abnormal   Collection Time: 12/02/19  4:20 AM  Result Value Ref Range   Potassium 3.4 (L) 3.5 - 5.1 mmol/L    Comment: Performed at [facility], [place] [address] [place], [place] [address]  Prealbumin     Status: Abnormal   Collection Time: 12/02/19  4:20 AM  Result Value Ref Range   Prealbumin 9.1 (L) 18 - 38 mg/dL    Comment: Performed at [facility], [place] [address] [place],  [address]    Imaging / Studies: CT ABDOMEN PELVIS W CONTRAST  Result Date: 11/30/2019 CLINICAL DATA:  Fever, abdominal pain, postop ileostomy take down 11/23/2019 EXAM: CT ABDOMEN AND PELVIS WITH CONTRAST TECHNIQUE: Multidetector CT imaging of the abdomen and pelvis was performed using the standard protocol following bolus administration of intravenous contrast. CONTRAST:  157mL OMNIPAQUE IOHEXOL 300 MG/ML  SOLN COMPARISON:  10/29/2019 FINDINGS: Lower chest: Hypoventilatory changes are seen at the lung bases, with bilateral lower lobe atelectasis. There are trace bilateral pleural effusions. There is minimal gas within the epiphrenic fat consistent with recent laparoscopic procedure. Hepatobiliary: No focal liver abnormality is seen. No gallstones, gallbladder wall thickening, or biliary dilatation. Pancreas: Unremarkable. No pancreatic ductal dilatation or surrounding  inflammatory changes. Spleen: Normal in size without focal abnormality. Adrenals/Urinary Tract: Adrenal glands are unremarkable. Kidneys are normal, without renal calculi, focal lesion, or hydronephrosis. Bladder is unremarkable. Stomach/Bowel: There is diffuse gaseous distension of the large and small bowel likely representing postoperative ileus. Gas fluid levels are seen within the small bowel and colon, to the level of the splenic flexure. There is wall thickening of the cecum at the site of the ileocolic anastomosis. Punctate foci of gas within the non dependent colonic wall consistent with pneumatosis, nonspecific given recent surgical intervention. Continued follow-up is recommended to exclude underlying ischemia. There is no evidence of anastomotic breakdown, fluid collection, or abscess. Vascular/Lymphatic: Aortic atherosclerosis. No enlarged abdominal or pelvic lymph nodes. Reproductive: Uterus and bilateral adnexa are unremarkable. Other: Postsurgical  changes are seen from ileostomy takedown and parastomal hernia repair. No evidence of residual or recurrent ventral hernia. There is subcutaneous fat stranding within the anterior abdominal wall, with punctate foci of subcutaneous gas, consistent with recent surgical intervention. No fluid collection or abscess. There is trace free fluid within the lower abdomen and pelvis. No fluid collection or abscess. Musculoskeletal: No acute or destructive bony lesions. Reconstructed images demonstrate no additional findings. IMPRESSION: 1. Postoperative changes from recent laparoscopic surgery, ostomy takedown, and left lower quadrant parastomal hernia repair. 2. Mild wall thickening and pneumatosis within the proximal colon at the site of ileocolic anastomosis. This is a nonspecific finding given recent surgical intervention, and continued imaging follow-up is recommended to exclude underlying ischemia. 3. Diffuse distension of large and small bowel, with scattered  gas fluid levels, most consistent with postoperative ileus. 4. No evidence of fluid collection or abscess within the abdomen, pelvis, or abdominal wall. Trace ascites as above. 5. Small bilateral pleural effusions. Electronically Signed   By: Randa Ngo M.D.   On: 11/30/2019 18:29    Medications / Allergies: per chart  Antibiotics: Anti-infectives (From admission, onward)   Start     Dose/Rate Route Frequency Ordered Stop   12/01/19 0800  piperacillin-tazobactam (ZOSYN) IVPB 3.375 g     3.375 g 12.5 mL/hr over 240 Minutes Intravenous Every 8 hours 12/01/19 0716 12/06/19 0759   11/24/19 0200  cefoTEtan (CEFOTAN) 2 g in sodium chloride 0.9 % 100 mL IVPB     2 g 200 mL/hr over 30 Minutes Intravenous Every 12 hours 11/23/19 1646 11/24/19 0148   11/23/19 0645  cefoTEtan (CEFOTAN) 2 g in sodium chloride 0.9 % 100 mL IVPB  Status:  Discontinued     2 g 200 mL/hr over 30 Minutes Intravenous On call to O.R. 11/23/19 [id number] 11/23/19 1646        Note: Portions of this report may have been transcribed using voice recognition software. Every effort was made to ensure accuracy; however, inadvertent computerized transcription errors may be present.   Any transcriptional errors that result from this process are unintentional.   Orbie Pyo, PA-S, [employer] Patient was seen and evaluated in coordination with Kandis Ban, MD   12/02/2019  7:35 AM

## 2019-12-02 NOTE — TOC Progression Note (Signed)
Transition of Care [facility]) - Progression Note    Patient Details  Name: Emily Holloway MRN: [id number] Date of Birth: May 05, 1956  Transition of [facility]) CM/SW Contact  Lennart Pall, LCSW Phone Number: 12/02/2019, 12:59 PM  Clinical Narrative:   Visit with pt today who is smiling and looks more alert.  She is happy she had a "great shower" and a good night's sleep.  She states still feeling very weak, however, improving and hopes to be ready to go home at the beginning of the week.  We discuss plans for [facility] tx and potential DME needs.  Will plan to follow up with her Monday morning.    Expected Discharge Plan: [place] Barriers to Discharge: Continued Medical Work up  Expected Discharge Plan and Services Expected Discharge Plan: [place] In-house Referral: Clinical Social Work   Post Acute Care Choice: [place] arrangements for the past 2 months: Single Family Home                                       Social Determinants of Health (SDOH) Interventions    Readmission Risk Interventions Readmission Risk Prevention Plan 11/29/2019  Transportation Screening Complete  Some recent data might be hidden

## 2019-12-02 NOTE — Progress Notes (Signed)
Occupational Therapy Treatment Patient Details Name: Melvis Pedley MRN: [id number] DOB: 08-30-1955 Today's Date: 12/02/2019    History of present illness Pt presents with Parastomal hernia- s/p end ileostomy takedown, parastomal hernia repair   OT comments  Treatment focused on improving activity tolerance and functional mobility needed for ADLs and IADLs. Patient ambulated in room, performed sit to stands and toileting. Patient reported fatigue at end of treatment.  Cont POC  Follow Up Recommendations  Home health OT;Supervision/Assistance - 24 hour    Equipment Recommendations  3 in 1 bedside commode    Recommendations for Other Services      Precautions / Restrictions Precautions Precautions: Fall Restrictions Weight Bearing Restrictions: No       Mobility Bed Mobility   Bed Mobility: Rolling;Sidelying to Sit;Sit to Sidelying Rolling: Supervision Sidelying to sit: Supervision     Sit to sidelying: Supervision General bed mobility comments: increased time but no physical assist required  Transfers                 General transfer comment: Patient performed sit to stands x 5 to work on improving functional mobility and activity tolerance. Patient ambulated into bathroom and back to bed (approx 20 feet). Patient performed sit to stand x 5 again. Patient ambulated to bathroom - required actual toileting and returned to side of bed (approx another 20 feet). Supervision to return to bed.    Balance                                           ADL either performed or assessed with clinical judgement   ADL                           Toilet Transfer: Supervision/safety;Ambulation;Regular Toilet;Grab bars                   Vision       Perception     Praxis      Cognition                                                Exercises     Shoulder Instructions       General Comments       Pertinent Vitals/ Pain       Pain Assessment: No/denies pain  Home Living                                          Prior Functioning/Environment              Frequency  Min 2X/week        Progress Toward Goals  OT Goals(current goals can now be found in the care plan section)        Plan      Co-evaluation                 AM-PAC OT "6 Clicks" Daily Activity     Outcome Measure                    End of Session Equipment Utilized During  Treatment: Rolling walker;Gait belt  OT Visit Diagnosis: Unsteadiness on feet (R26.81);Muscle weakness (generalized) (M62.81)   Activity Tolerance Patient tolerated treatment well   Patient Left with family/visitor present;in bed;with bed alarm set   Nurse Communication (IV leaking)        Time: [id number] OT Time Calculation (min): 17 min  Charges: OT General Charges $OT Visit: 1 Visit OT Treatments $Therapeutic Activity: 8-22 mins  Derl Barrow, OTR/L [place]  Office [place] 12/02/2019, 4:58 PM

## 2019-12-02 NOTE — Progress Notes (Signed)
Physical Therapy Treatment Patient Details Name: Emily Holloway MRN: [id number] DOB: 30-Jan-1956 Today's Date: 12/02/2019    History of Present Illness Pt presents with Parastomal hernia- s/p end ileostomy takedown, parastomal hernia repair    PT Comments    Pt continued very cooperative and is progressing slowly but steadily with mobility but continues to fatigue easily.  Pt hopeful to dc home with assist of family/friends rather than dc to SNF.   Follow Up Recommendations  Home health PT;Supervision - Intermittent     Equipment Recommendations  Rolling walker with 5" wheels    Recommendations for Other Services       Precautions / Restrictions Precautions Precautions: Fall Restrictions Weight Bearing Restrictions: No    Mobility  Bed Mobility Overal bed mobility: Needs Assistance Bed Mobility: Rolling;Sidelying to Sit;Sit to Sidelying Rolling: Supervision Sidelying to sit: Supervision     Sit to sidelying: Min guard General bed mobility comments: increased time but no physical assist required  Transfers Overall transfer level: Needs assistance Equipment used: Rolling walker (2 wheeled) Transfers: Sit to/from Stand Sit to Stand: Min assist;Min guard Stand pivot transfers: Min guard       General transfer comment: increased time to rise from EOB; steady assist only; stand/pvt wtih RW recliner to [facility]  Ambulation/Gait Ambulation/Gait assistance: Min guard Gait Distance (Feet): 200 Feet(100' twice with seated rest break between) Assistive device: Rolling walker (2 wheeled) Gait Pattern/deviations: Step-through pattern Gait velocity: decr   General Gait Details: cues for posture and position from RW; distance ltd by fatigue   Stairs             Wheelchair Mobility    Modified Rankin (Stroke Patients Only)       Balance Overall balance assessment: Needs assistance Sitting-balance support: Feet supported;No upper extremity  supported Sitting balance-Leahy Scale: Good     Standing balance support: Bilateral upper extremity supported;During functional activity Standing balance-Leahy Scale: Fair                              Cognition Arousal/Alertness: Awake/alert Behavior During Therapy: Flat affect Overall Cognitive Status: Within Functional Limits for tasks assessed                                        Exercises      General Comments        Pertinent Vitals/Pain Pain Assessment: 0-10 Pain Score: 3  Pain Location: abd Pain Descriptors / Indicators: Sore Pain Intervention(s): Limited activity within patient's tolerance;Monitored during session    Home Living                      Prior Function            PT Goals (current goals can now be found in the care plan section) Acute Rehab PT Goals Patient Stated Goal: get well PT Goal Formulation: With patient Time For Goal Achievement: 12/09/19 Potential to Achieve Goals: Good Progress towards PT goals: Progressing toward goals    Frequency    Min 3X/week      PT Plan Current plan remains appropriate    Co-evaluation              AM-PAC PT "6 Clicks" Mobility   Outcome Measure  Help needed turning from your back to your side while in a flat  bed without using bedrails?: A Little Help needed moving from lying on your back to sitting on the side of a flat bed without using bedrails?: A Little Help needed moving to and from a bed to a chair (including a wheelchair)?: A Little Help needed standing up from a chair using your arms (e.g., wheelchair or bedside chair)?: A Little Help needed to walk in hospital room?: A Little Help needed climbing 3-5 steps with a railing? : A Lot 6 Click Score: 17    End of Session Equipment Utilized During Treatment: Gait belt Activity Tolerance: Patient limited by fatigue Patient left: in bed;with call bell/phone within reach;with bed alarm set Nurse  Communication: Mobility status PT Visit Diagnosis: Difficulty in walking, not elsewhere classified (R26.2);Other abnormalities of gait and mobility (R26.89)     Time: [id number] PT Time Calculation (min) (ACUTE ONLY): 20 min  Charges:  $Gait Training: 8-22 mins                     [place] Pager [phone] Office [phone]    [place] 12/02/2019, 12:16 PM

## 2019-12-03 LAB — CBC
HCT: 31.7 % — ABNORMAL LOW (ref 36.0–46.0)
Hemoglobin: 10.3 g/dL — ABNORMAL LOW (ref 12.0–15.0)
MCH: 32.2 pg (ref 26.0–34.0)
MCHC: 32.5 g/dL (ref 30.0–36.0)
MCV: 99.1 fL (ref 80.0–100.0)
Platelets: 647 10*3/uL — ABNORMAL HIGH (ref 150–400)
RBC: 3.2 MIL/uL — ABNORMAL LOW (ref 3.87–5.11)
RDW: 14.6 % (ref 11.5–15.5)
WBC: 13.1 10*3/uL — ABNORMAL HIGH (ref 4.0–10.5)
nRBC: 0.2 % (ref 0.0–0.2)

## 2019-12-03 LAB — POTASSIUM: Potassium: 3.4 mmol/L — ABNORMAL LOW (ref 3.5–5.1)

## 2019-12-03 NOTE — Progress Notes (Signed)
Physical Therapy Treatment Patient Details Name: Emily Holloway MRN: [id number] DOB: [birth year] Today's Date: 12/03/2019    History of Present Illness Pt presents with Parastomal hernia- s/p end ileostomy takedown, parastomal hernia repair    PT Comments    Pt is progressing well with tolerance for activity, required no seated rest break during hallway ambulation. Pt still requires some cuing for safety with use of RW and pt posture, pt tends to flex forward for abdominal comfort. Pt states "my legs feel much stronger today", and pt appears encouraged by progress with mobility. PT continuing to recommend HHPT to address mobility deficits post-acutely.    Follow Up Recommendations  Home health PT;Supervision - Intermittent     Equipment Recommendations  Rolling walker with 5" wheels    Recommendations for Other Services       Precautions / Restrictions Precautions Precautions: Fall Restrictions Weight Bearing Restrictions: No    Mobility  Bed Mobility               General bed mobility comments: up in chair upon PT arrival to room  Transfers Overall transfer level: Needs assistance Equipment used: Rolling walker (2 wheeled) Transfers: Sit to/from Stand Sit to Stand: Min guard         General transfer comment: min guard for safety, verbal cuing for hand placement when rising. Slow and steady rise.  Ambulation/Gait Ambulation/Gait assistance: Min guard Gait Distance (Feet): 200 Feet Assistive device: Rolling walker (2 wheeled) Gait Pattern/deviations: Step-through pattern;Decreased stride length;Trunk flexed Gait velocity: decr   General Gait Details: min guard for safety, verbal cuing for upright posture and placement within RW.   Stairs             Wheelchair Mobility    Modified Rankin (Stroke Patients Only)       Balance Overall balance assessment: Needs assistance Sitting-balance support: Feet supported;No upper extremity  supported Sitting balance-Leahy Scale: Good     Standing balance support: Bilateral upper extremity supported;During functional activity Standing balance-Leahy Scale: Fair                              Cognition Arousal/Alertness: Awake/alert Behavior During Therapy: WFL for tasks assessed/performed Overall Cognitive Status: Within Functional Limits for tasks assessed                                        Exercises      General Comments        Pertinent Vitals/Pain Pain Assessment: Faces Faces Pain Scale: Hurts a little bit Pain Location: abdomen, related to GI discomfort NOT incisional Pain Descriptors / Indicators: Discomfort Pain Intervention(s): Limited activity within patient's tolerance;Monitored during session;Repositioned    Home Living                      Prior Function            PT Goals (current goals can now be found in the care plan section) Acute Rehab PT Goals Patient Stated Goal: get well PT Goal Formulation: With patient Time For Goal Achievement: 12/09/19 Potential to Achieve Goals: Good Progress towards PT goals: Progressing toward goals    Frequency    Min 3X/week      PT Plan Current plan remains appropriate    Co-evaluation  AM-PAC PT "6 Clicks" Mobility   Outcome Measure  Help needed turning from your back to your side while in a flat bed without using bedrails?: A Little Help needed moving from lying on your back to sitting on the side of a flat bed without using bedrails?: A Little Help needed moving to and from a bed to a chair (including a wheelchair)?: A Little Help needed standing up from a chair using your arms (e.g., wheelchair or bedside chair)?: A Little Help needed to walk in hospital room?: A Little Help needed climbing 3-5 steps with a railing? : A Lot 6 Click Score: 17    End of Session   Activity Tolerance: Patient limited by fatigue Patient left: in  bed;with call bell/phone within reach;with bed alarm set Nurse Communication: Mobility status PT Visit Diagnosis: Difficulty in walking, not elsewhere classified (R26.2);Other abnormalities of gait and mobility (R26.89)     Time: 1437-1450 PT Time Calculation (min) (ACUTE ONLY): 13 min  Charges:  $Gait Training: 8-22 mins                     Taleah Bellantoni E, PT Acute Rehabilitation Services Pager [phone]  Office [phone]  Gentle Hoge D Elonda Husky 12/03/2019, 3:15 PM

## 2019-12-03 NOTE — Progress Notes (Signed)
10 Days Post-Op   Subjective/Chief Complaint: Feeling better. Still having trouble with solid food because of the taste   Objective: Vital signs in last 24 hours: Temp:  [97.5 F (36.4 C)-98.3 F (36.8 C)] 97.5 F (36.4 C) (05/08 0532) Pulse Rate:  [83-90] 83 (05/08 0532) Resp:  [17-18] 17 (05/08 0532) BP: (114-121)/(70-76) 114/76 (05/08 0532) SpO2:  [93 %-94 %] 94 % (05/08 0532) Last BM Date: 12/01/19  Intake/Output from previous day: 05/07 0701 - 05/08 0700 In: 1454.6 [P.O.:600; I.V.:681.7; IV Piggyback:172.9] Out: 875 [Urine:875] Intake/Output this shift: No intake/output data recorded.  General appearance: alert and cooperative Resp: clear to auscultation bilaterally Cardio: regular rate and rhythm GI: soft, minimal tenderness. incisions look good  Lab Results:  Recent Labs    12/02/19 0420 12/03/19 0520  WBC 9.4 13.1*  HGB 9.9* 10.3*  HCT 31.0* 31.7*  PLT 593* 647*   BMET Recent Labs    12/01/19 0429 12/01/19 0429 12/02/19 0420 12/03/19 0520  K 3.6   < > 3.4* 3.4*  CREATININE 0.48  --   --   --    < > = values in this interval not displayed.   PT/INR No results for input(s): LABPROT, INR in the last 72 hours. ABG No results for input(s): PHART, HCO3 in the last 72 hours.  Invalid input(s): PCO2, PO2  Studies/Results: No results found.  Anti-infectives: Anti-infectives (From admission, onward)   Start     Dose/Rate Route Frequency Ordered Stop   12/01/19 0800  piperacillin-tazobactam (ZOSYN) IVPB 3.375 g     3.375 g 12.5 mL/hr over 240 Minutes Intravenous Every 8 hours 12/01/19 0716 12/06/19 0759   11/24/19 0200  cefoTEtan (CEFOTAN) 2 g in sodium chloride 0.9 % 100 mL IVPB     2 g 200 mL/hr over 30 Minutes Intravenous Every 12 hours 11/23/19 1646 11/24/19 0148   11/23/19 0645  cefoTEtan (CEFOTAN) 2 g in sodium chloride 0.9 % 100 mL IVPB  Status:  Discontinued     2 g 200 mL/hr over 30 Minutes Intravenous On call to O.R. 11/23/19 [id number]  11/23/19 1646      Assessment/Plan: s/p Procedure(s): XI ROBOTIC END ILEOSTOMY TAKEDOWN, INTRACORPOREAL ANASTOMOSIS, OMENTOPEXY, BILATERAL TAP BLOCK (N/A) COLONOSCOPY (N/A) PARASTOMAL HERNIA REPAIR (N/A) LYSIS OF ADHESIONS X3 HOURS (N/A) Advance diet  -Nausea starting to resolve with downgraded diet to clears. Continue clear liquid diet and IVF, advance to Dysphasia 1 as tolerated. NG tube not indicated at this time but if no improvement by Monday, possibly start TNA.   -Continue to try and mobilize more  -Continue IV Zosyn x5 days  -Zofran PRN. Continue Simethicone and Reglan for bloating. Bloating has improved.   -Continue Zoloft  -S/pIV iron for acute postoperative on chronic anemia.PO MVI w iron.Hold off on transfusion.  -VTE prophylaxis- SCDs, etc  -Tachycardia resolved without shock.Decreasing metoprolol  -Weanedoff oxygen.  -No electrolyte abnormalities. Follow.  -Pain control with po non-narcotics.   LOS: 10 days    Emily Holloway 12/03/2019

## 2019-12-04 LAB — CBC
HCT: 30.6 % — ABNORMAL LOW (ref 36.0–46.0)
Hemoglobin: 9.6 g/dL — ABNORMAL LOW (ref 12.0–15.0)
MCH: 31 pg (ref 26.0–34.0)
MCHC: 31.4 g/dL (ref 30.0–36.0)
MCV: 98.7 fL (ref 80.0–100.0)
Platelets: 655 10*3/uL — ABNORMAL HIGH (ref 150–400)
RBC: 3.1 MIL/uL — ABNORMAL LOW (ref 3.87–5.11)
RDW: 14.9 % (ref 11.5–15.5)
WBC: 11.7 10*3/uL — ABNORMAL HIGH (ref 4.0–10.5)
nRBC: 0 % (ref 0.0–0.2)

## 2019-12-04 LAB — POTASSIUM: Potassium: 3.7 mmol/L (ref 3.5–5.1)

## 2019-12-04 NOTE — Progress Notes (Signed)
11 Days Post-Op   Subjective/Chief Complaint: Pain control improving Tolerating diet, appetite improving Having Bm's   Objective: Vital signs in last 24 hours: Temp:  [97.9 F (36.6 C)-98.5 F (36.9 C)] 97.9 F (36.6 C) (05/09 0520) Pulse Rate:  [79] 79 (05/08 1406) Resp:  [16-18] 18 (05/09 0520) BP: (115-120)/(69-76) 115/69 (05/09 0520) SpO2:  [97 %-98 %] 98 % (05/09 0520) Last BM Date: 12/03/19  Intake/Output from previous day: 05/08 0701 - 05/09 0700 In: 1718.6 [P.O.:960; I.V.:642.5; IV Piggyback:116.1] Out: 3 [Urine:2; Stool:1] Intake/Output this shift: No intake/output data recorded.  Exam; Awake and alert Abdomen soft, non-distended, incisions clean  Lab Results:  Recent Labs    12/03/19 0520 12/04/19 0435  WBC 13.1* 11.7*  HGB 10.3* 9.6*  HCT 31.7* 30.6*  PLT 647* 655*   BMET Recent Labs    12/03/19 0520 12/04/19 0435  K 3.4* 3.7   PT/INR No results for input(s): LABPROT, INR in the last 72 hours. ABG No results for input(s): PHART, HCO3 in the last 72 hours.  Invalid input(s): PCO2, PO2  Studies/Results: No results found.  Anti-infectives: Anti-infectives (From admission, onward)   Start     Dose/Rate Route Frequency Ordered Stop   12/01/19 0800  piperacillin-tazobactam (ZOSYN) IVPB 3.375 g     3.375 g 12.5 mL/hr over 240 Minutes Intravenous Every 8 hours 12/01/19 0716 12/06/19 0759   11/24/19 0200  cefoTEtan (CEFOTAN) 2 g in sodium chloride 0.9 % 100 mL IVPB     2 g 200 mL/hr over 30 Minutes Intravenous Every 12 hours 11/23/19 1646 11/24/19 0148   11/23/19 0645  cefoTEtan (CEFOTAN) 2 g in sodium chloride 0.9 % 100 mL IVPB  Status:  Discontinued     2 g 200 mL/hr over 30 Minutes Intravenous On call to O.R. 11/23/19 [id number] 11/23/19 1646      Assessment/Plan: s/p Procedure(s): XI ROBOTIC END ILEOSTOMY TAKEDOWN, INTRACORPOREAL ANASTOMOSIS, OMENTOPEXY, BILATERAL TAP BLOCK (N/A) COLONOSCOPY (N/A) PARASTOMAL HERNIA REPAIR (N/A) LYSIS OF  ADHESIONS X3 HOURS (N/A)  Continues to improve Potentially home tomorrow with home health  LOS: 11 days    Coralie Keens 12/04/2019

## 2019-12-05 LAB — CREATININE, SERUM
Creatinine, Ser: 0.52 mg/dL (ref 0.44–1.00)
GFR calc Af Amer: >60 mL/min (ref >60)
GFR calc non Af Amer: >60 mL/min (ref >60)

## 2019-12-05 MED ORDER — ONDANSETRON 4 MG PO TBDP: 4.0000 mg | ORAL_TABLET | Freq: Four times a day (QID) | ORAL | 2 refills | Status: DC | PRN

## 2019-12-05 MED ORDER — HYDROCODONE-ACETAMINOPHEN 7.5-325 MG PO TABS: 1.0000 | ORAL_TABLET | Freq: Four times a day (QID) | ORAL | 0 refills | Status: DC | PRN

## 2019-12-05 MED ORDER — AMOXICILLIN-POT CLAVULANATE 875-125 MG PO TABS
1.0000 | ORAL_TABLET | Freq: Two times a day (BID) | ORAL | 1 refills | Status: DC
Start: 2019-12-05 — End: 2020-04-05
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
## 2019-12-05 NOTE — TOC Transition Note (Signed)
Transition of Care [facility]) - CM/SW Discharge Note   Patient Details  Name: Emily Holloway MRN: [id number] Date of Birth: April 26, 1956  Transition of Care [facility]) CM/SW Contact:  Lennart Pall, LCSW Phone Number: 12/05/2019, 10:56 AM   Clinical Narrative:   Pt feeling much better today and reports feeling ready for d/c.  Agreeable with HHPT and rolling walker.  Has a friend who is available 24/7 to assist.  No further needs.    Final next level of care: [place] Barriers to Discharge: Barriers Resolved   Patient Goals and CMS Choice Patient states their goals for this hospitalization and ongoing recovery are:: Pt goal is to be independent since she does not have 24/7 assistance CMS Medicare.gov Compare Post Acute Care list provided to:: Patient Choice offered to / list presented to : Patient  Discharge Placement                       Discharge Plan and Services In-house Referral: Clinical Social Work   Post Acute Care Choice: Home Health          DME Arranged: Gilford Rile rolling DME Agency: [employer] Date DME Agency Contacted: 12/05/19 Time DME Agency Contacted: [id number] Representative spoke with at DME Agency: [place]: PT [place]: [place] Date [place]: 12/05/19 Time [place]: 1056 Representative spoke with at [place]: [place] ([place]) Interventions     Readmission Risk Interventions Readmission Risk Prevention Plan 11/29/2019  Transportation Screening Complete  Some recent data might be hidden

## 2019-12-06 DIAGNOSIS — Z87891 Personal history of nicotine dependence: Secondary | ICD-10-CM | POA: Diagnosis not present

## 2019-12-06 DIAGNOSIS — R911 Solitary pulmonary nodule: Secondary | ICD-10-CM | POA: Diagnosis not present

## 2019-12-06 DIAGNOSIS — K589 Irritable bowel syndrome without diarrhea: Secondary | ICD-10-CM | POA: Diagnosis not present

## 2019-12-06 DIAGNOSIS — Z792 Long term (current) use of antibiotics: Secondary | ICD-10-CM | POA: Diagnosis not present

## 2019-12-06 DIAGNOSIS — Z6829 Body mass index (BMI) 29.0-29.9, adult: Secondary | ICD-10-CM | POA: Diagnosis not present

## 2019-12-06 DIAGNOSIS — M8580 Other specified disorders of bone density and structure, unspecified site: Secondary | ICD-10-CM | POA: Diagnosis not present

## 2019-12-06 DIAGNOSIS — K9189 Other postprocedural complications and disorders of digestive system: Secondary | ICD-10-CM | POA: Diagnosis not present

## 2019-12-06 DIAGNOSIS — E669 Obesity, unspecified: Secondary | ICD-10-CM | POA: Diagnosis not present

## 2019-12-06 DIAGNOSIS — D649 Anemia, unspecified: Secondary | ICD-10-CM | POA: Diagnosis not present

## 2019-12-06 DIAGNOSIS — K9419 Other complications of enterostomy: Secondary | ICD-10-CM | POA: Diagnosis not present

## 2019-12-06 DIAGNOSIS — Z85048 Personal history of other malignant neoplasm of rectum, rectosigmoid junction, and anus: Secondary | ICD-10-CM | POA: Diagnosis not present

## 2019-12-06 DIAGNOSIS — Z9181 History of falling: Secondary | ICD-10-CM | POA: Diagnosis not present

## 2020-02-24 ENCOUNTER — Encounter (INDEPENDENT_AMBULATORY_CARE_PROVIDER_SITE_OTHER): Payer: BC Managed Care – PPO | Admitting: Ophthalmology

## 2020-02-24 ENCOUNTER — Other Ambulatory Visit: Payer: Self-pay

## 2020-02-24 DIAGNOSIS — H43813 Vitreous degeneration, bilateral: Secondary | ICD-10-CM

## 2020-02-24 DIAGNOSIS — H33302 Unspecified retinal break, left eye: Secondary | ICD-10-CM

## 2020-02-24 DIAGNOSIS — H2513 Age-related nuclear cataract, bilateral: Secondary | ICD-10-CM

## 2020-02-24 DIAGNOSIS — H35372 Puckering of macula, left eye: Secondary | ICD-10-CM

## 2020-04-05 ENCOUNTER — Encounter: Payer: Self-pay | Admitting: Obstetrics & Gynecology

## 2020-04-05 ENCOUNTER — Other Ambulatory Visit: Payer: Self-pay

## 2020-04-05 ENCOUNTER — Ambulatory Visit: Payer: BC Managed Care – PPO | Admitting: Obstetrics & Gynecology

## 2020-04-05 VITALS — BP 124/80 | Ht 61.0 in | Wt 155.0 lb

## 2020-04-05 DIAGNOSIS — M8589 Other specified disorders of bone density and structure, multiple sites: Secondary | ICD-10-CM | POA: Diagnosis not present

## 2020-04-05 DIAGNOSIS — Z780 Asymptomatic menopausal state: Secondary | ICD-10-CM

## 2020-04-05 DIAGNOSIS — Z01419 Encounter for gynecological examination (general) (routine) without abnormal findings: Secondary | ICD-10-CM

## 2020-04-05 DIAGNOSIS — Z85048 Personal history of other malignant neoplasm of rectum, rectosigmoid junction, and anus: Secondary | ICD-10-CM | POA: Diagnosis not present

## 2020-04-05 NOTE — Progress Notes (Signed)
Emily Holloway 09-04-1955 [id number]   History:    64 y.o. G0 Married  RP:  Established patient presenting for annual gyn exam   HPI: Menopause, well on no HRT. No PMB. No pelvic pain. Abstinentbecause husband is concerned about risks of recurrent vaginal rectal fistula. 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 Followed by Oncologist. Colonoscopy 2018 at [facility]. Had hernia repair and reversal of Colostomy bag this summer. Urine/BMs normal. Breasts wnl. BMI 29.29.  Walking.  Health labs with family physician.  Past medical history,surgical history, family history and social history were all reviewed and documented in the EPIC chart.  Gynecologic History No LMP recorded. Patient is postmenopausal.  Obstetric History OB History  Gravida Para Term Preterm AB Living  0 0 0 0 0 0  SAB TAB Ectopic Multiple Live Births  0 0 0 0 0     ROS: A ROS was performed and pertinent positives and negatives are included in the history.  GENERAL: No fevers or chills. HEENT: No change in vision, no earache, sore throat or sinus congestion. NECK: No pain or stiffness. CARDIOVASCULAR: No chest pain or pressure. No palpitations. PULMONARY: No shortness of breath, cough or wheeze. GASTROINTESTINAL: No abdominal pain, nausea, vomiting or diarrhea, melena or bright red blood per rectum. GENITOURINARY: No urinary frequency, urgency, hesitancy or dysuria. MUSCULOSKELETAL: No joint or muscle pain, no back pain, no recent trauma. DERMATOLOGIC: No rash, no itching, no lesions. ENDOCRINE: No polyuria, polydipsia, no heat or cold intolerance. No recent change in weight. HEMATOLOGICAL: No anemia or easy bruising or bleeding. NEUROLOGIC: No headache, seizures, numbness, tingling or weakness. PSYCHIATRIC: No depression, no loss of interest in normal activity or change in sleep pattern.     Exam:   BP 124/80   Ht 5\' 1"  (1.549 m)   Wt 155 lb (70.3 kg)    BMI 29.29 kg/m   Body mass index is 29.29 kg/m.  General appearance : Well developed well nourished female. No acute distress HEENT: Eyes: no retinal hemorrhage or exudates,  Neck supple, trachea midline, no carotid bruits, no thyroidmegaly Lungs: Clear to auscultation, no rhonchi or wheezes, or rib retractions  Heart: Regular rate and rhythm, no murmurs or gallops Breast:Examined in sitting and supine position were symmetrical in appearance, no palpable masses or tenderness,  no skin retraction, no nipple inversion, no nipple discharge, no skin discoloration, no axillary or supraclavicular lymphadenopathy Abdomen: no palpable masses or tenderness, no rebound or guarding Extremities: no edema or skin discoloration or tenderness  Pelvic: Vulva: Normal             Vagina: No gross lesions or discharge  Cervix: No gross lesions or discharge.  Pap reflex done.  Uterus  AV, normal size, shape and consistency, non-tender and mobile  Adnexa  Without masses or tenderness  Anus: Normal   Assessment/Plan:  64 y.o. female for annual exam   1. Encounter for routine gynecological examination with Papanicolaou smear of cervix Normal gynecology exam.  Pap reflex done.  No evidence of rectovaginal fistula on speculum and bimanual exam.  Breasts normal.  Screening mammogram March 2021 was negative.  Colonoscopy 2021.  Health labs with family physician.  Improved body mass index at 29.29.  Will continue to be physically active.  Low calorie/carb diet recommended.  2. Postmenopause Well on no hormone replacement therapy.  No postmenopausal bleeding.  3. Osteopenia of multiple sites Osteopenia on bone density April 2019.  We will repeat a bone density now.  Vitamin D supplements, calcium intake of 1500 mg daily and regular weightbearing physical activity is recommended. DG Bone Density; Future  4. History of rectal cancer, T2N0, Nov2010 Had a hernia repair and reversal of the colostomy bag.  Followed  by oncology.  Other orders - Calcium-Vitamin D-Vitamin K (VIACTIV CALCIUM PLUS D) 650-12.5-40 MG-MCG-MCG CHEW; Chew by mouth.  Princess Bruins MD, 2:54 PM 04/05/2020

## 2020-04-05 NOTE — Addendum Note (Signed)
Addended by: Thurnell Garbe A on: 04/05/2020 04:22 PM   Modules accepted: Orders

## 2020-04-09 LAB — PAP IG W/ RFLX HPV ASCU

## 2020-04-20 DIAGNOSIS — R590 Localized enlarged lymph nodes: Secondary | ICD-10-CM | POA: Diagnosis not present

## 2020-04-20 DIAGNOSIS — C20 Malignant neoplasm of rectum: Secondary | ICD-10-CM | POA: Diagnosis not present

## 2020-04-24 DIAGNOSIS — C20 Malignant neoplasm of rectum: Secondary | ICD-10-CM | POA: Diagnosis not present

## 2020-05-09 DIAGNOSIS — Z131 Encounter for screening for diabetes mellitus: Secondary | ICD-10-CM | POA: Diagnosis not present

## 2020-05-09 DIAGNOSIS — Z13220 Encounter for screening for lipoid disorders: Secondary | ICD-10-CM | POA: Diagnosis not present

## 2020-05-09 DIAGNOSIS — Z0000 Encounter for general adult medical examination without abnormal findings: Secondary | ICD-10-CM | POA: Diagnosis not present

## 2020-05-09 DIAGNOSIS — Z23 Encounter for immunization: Secondary | ICD-10-CM | POA: Diagnosis not present

## 2020-06-05 ENCOUNTER — Other Ambulatory Visit: Payer: Self-pay | Admitting: Obstetrics & Gynecology

## 2020-06-05 ENCOUNTER — Other Ambulatory Visit: Payer: Self-pay

## 2020-06-05 ENCOUNTER — Ambulatory Visit (INDEPENDENT_AMBULATORY_CARE_PROVIDER_SITE_OTHER): Payer: BC Managed Care – PPO

## 2020-06-05 DIAGNOSIS — M8589 Other specified disorders of bone density and structure, multiple sites: Secondary | ICD-10-CM

## 2020-06-05 DIAGNOSIS — Z780 Asymptomatic menopausal state: Secondary | ICD-10-CM

## 2020-06-05 DIAGNOSIS — Z23 Encounter for immunization: Secondary | ICD-10-CM | POA: Diagnosis not present

## 2020-06-06 DIAGNOSIS — Z1283 Encounter for screening for malignant neoplasm of skin: Secondary | ICD-10-CM | POA: Diagnosis not present

## 2020-06-06 DIAGNOSIS — L821 Other seborrheic keratosis: Secondary | ICD-10-CM | POA: Diagnosis not present

## 2020-07-12 DIAGNOSIS — H43812 Vitreous degeneration, left eye: Secondary | ICD-10-CM | POA: Diagnosis not present

## 2020-07-12 DIAGNOSIS — H5213 Myopia, bilateral: Secondary | ICD-10-CM | POA: Diagnosis not present

## 2020-07-12 DIAGNOSIS — H04129 Dry eye syndrome of unspecified lacrimal gland: Secondary | ICD-10-CM | POA: Diagnosis not present

## 2020-07-12 DIAGNOSIS — H25013 Cortical age-related cataract, bilateral: Secondary | ICD-10-CM | POA: Diagnosis not present

## 2020-07-13 DIAGNOSIS — C20 Malignant neoplasm of rectum: Secondary | ICD-10-CM | POA: Diagnosis not present

## 2020-07-13 DIAGNOSIS — C189 Malignant neoplasm of colon, unspecified: Secondary | ICD-10-CM | POA: Diagnosis not present

## 2020-07-17 DIAGNOSIS — C20 Malignant neoplasm of rectum: Secondary | ICD-10-CM | POA: Diagnosis not present

## 2020-08-26 IMAGING — CT CT ABD-PELV W/ CM
2 of 5 series · 15 of 46 positions shown, 17 images · IV contrast (omnipaque)
Comparison: 10/29/2019

CLINICAL DATA: Fever, abdominal pain, postop ileostomy take down
11/23/2019

EXAM:
CT ABDOMEN AND PELVIS WITH CONTRAST
TECHNIQUE: Multidetector CT imaging of the abdomen and pelvis was performed
using the standard protocol following bolus administration of
intravenous contrast.
CONTRAST:  100mL OMNIPAQUE IOHEXOL 300 MG/ML  SOLN

[Series 3: axial st · axial · 0.88mm/px · z∈[-374,+46]mm · 12 of 100 slices shown, 14 images]
[im 8/100  soft-tissue]
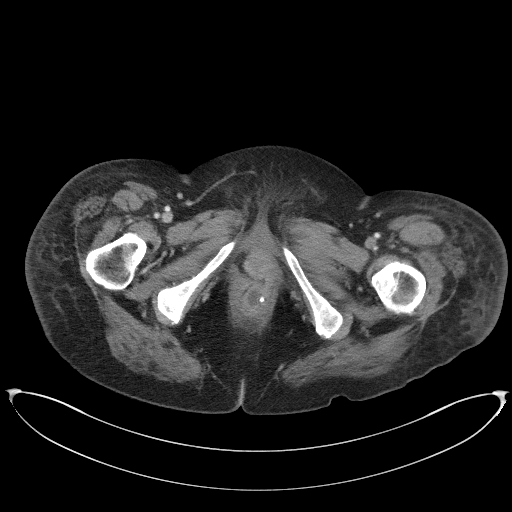
[im 8/100  bone]
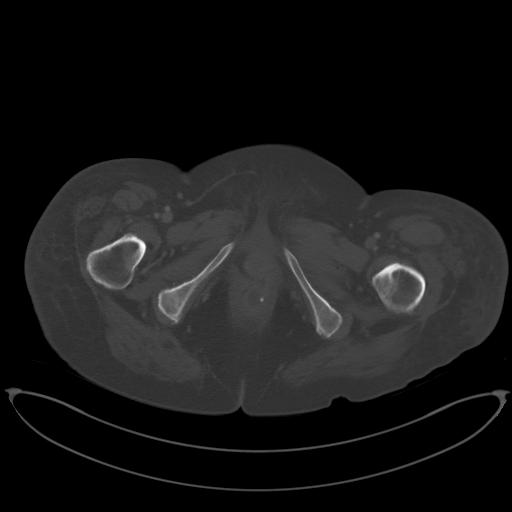
[im 15/100  soft-tissue]
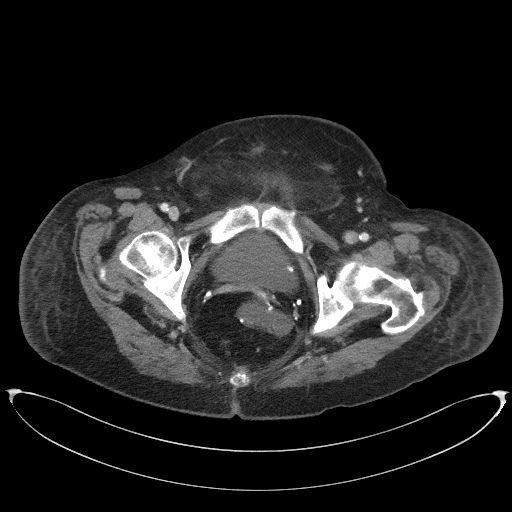
[im 22/100  soft-tissue]
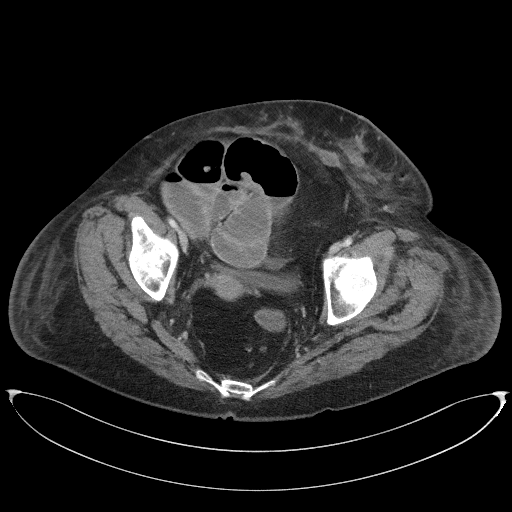
[im 29/100  soft-tissue]
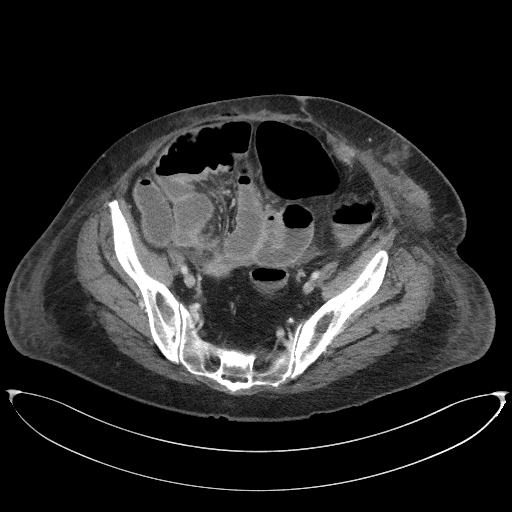
[im 36/100  soft-tissue]
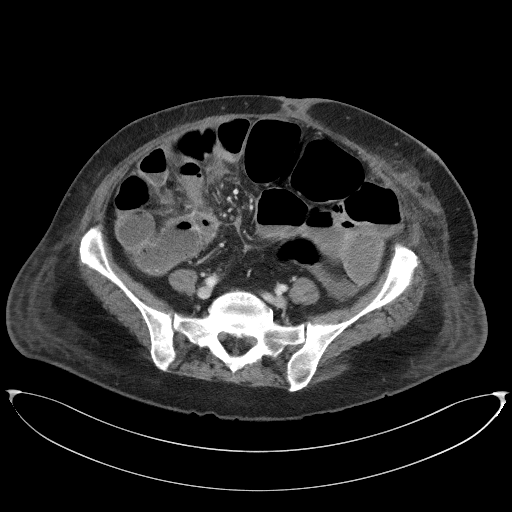
[im 43/100  soft-tissue]
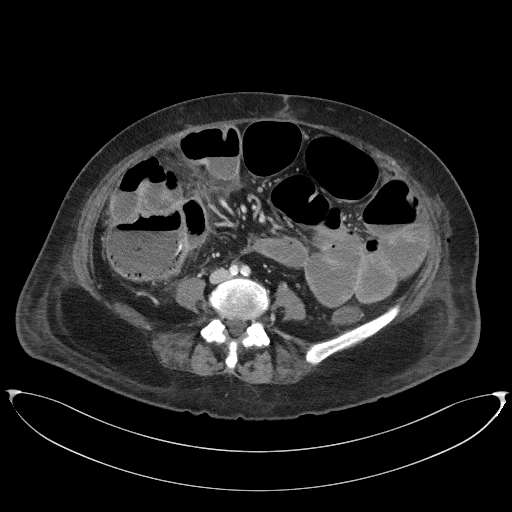
[im 57/100  soft-tissue]
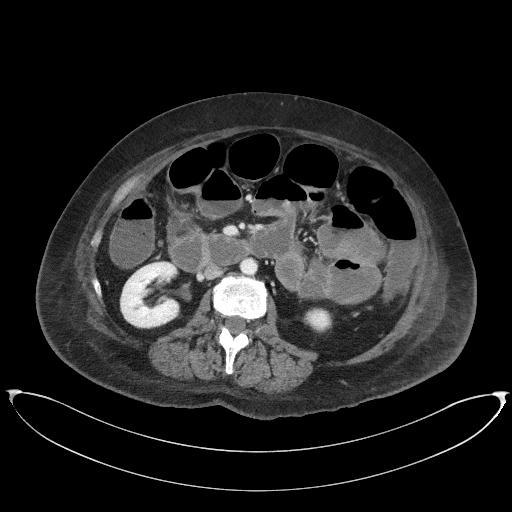
[im 64/100  soft-tissue]
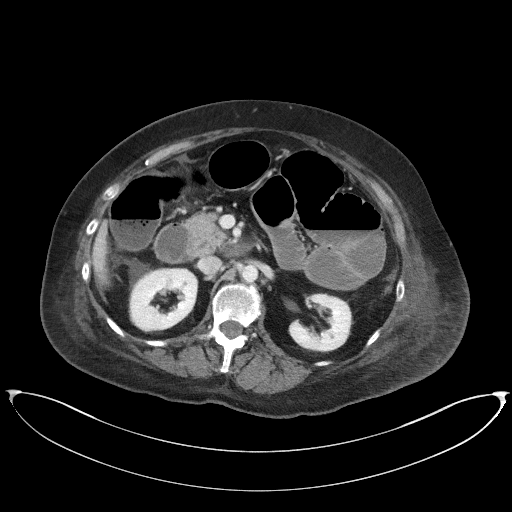
[im 71/100  soft-tissue]
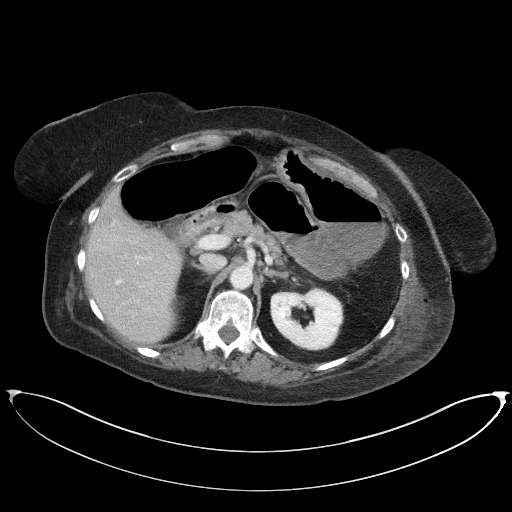
[im 71/100  bone]
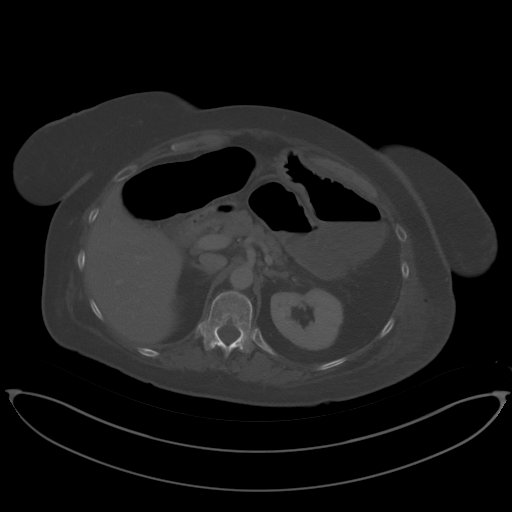
[im 78/100  soft-tissue]
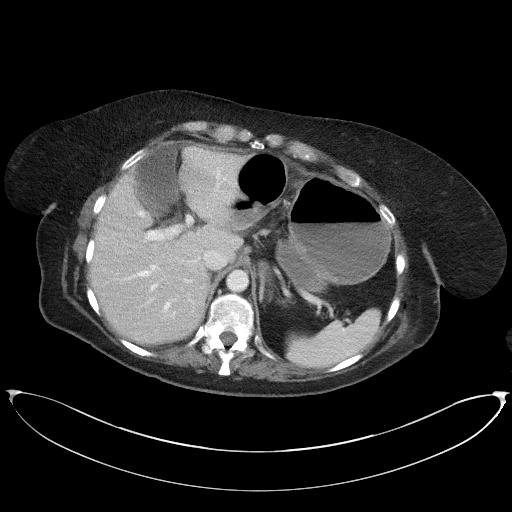
[im 85/100  soft-tissue]
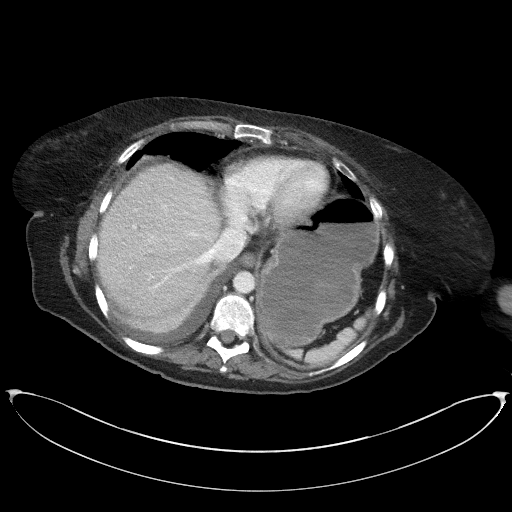
[im 92/100  soft-tissue]
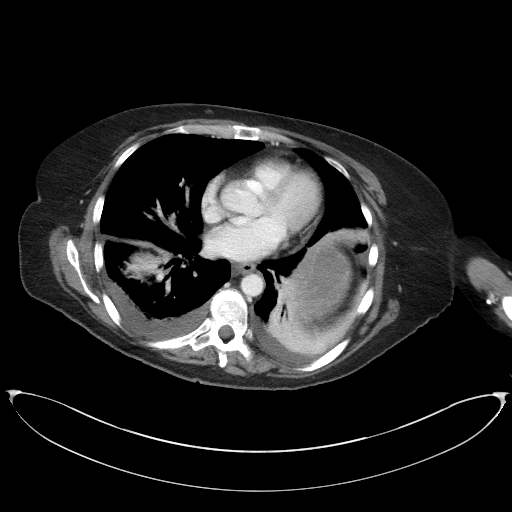

[Series 6: coronal st · coronal · 0.80mm/px · 3 of 101 slices shown]
[im 34/101  soft-tissue]
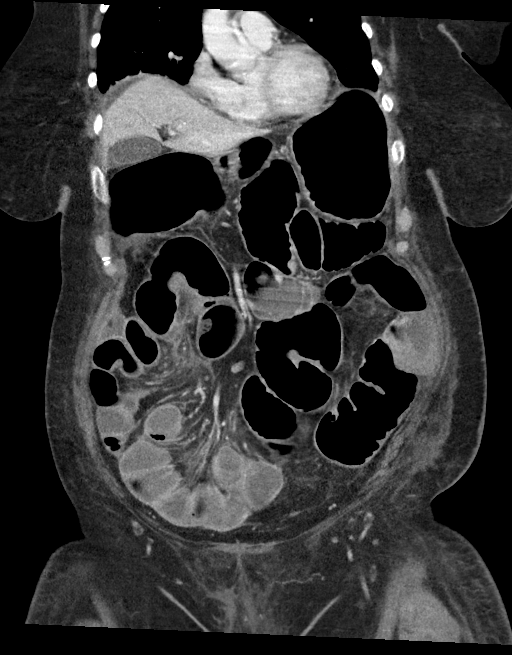
[im 45/101  soft-tissue]
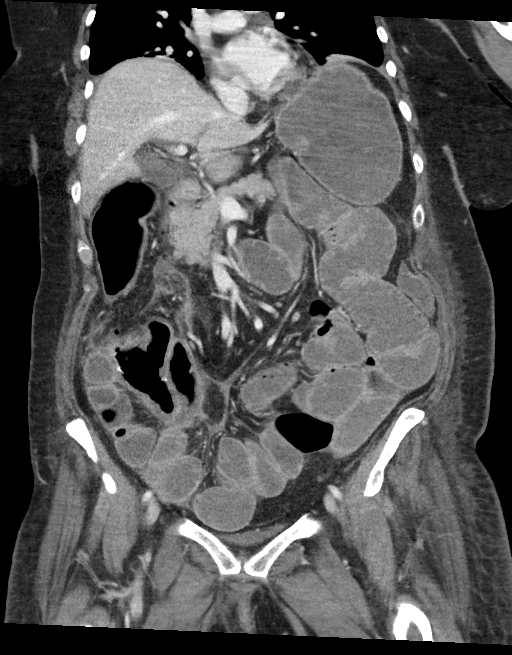
[im 56/101  soft-tissue]
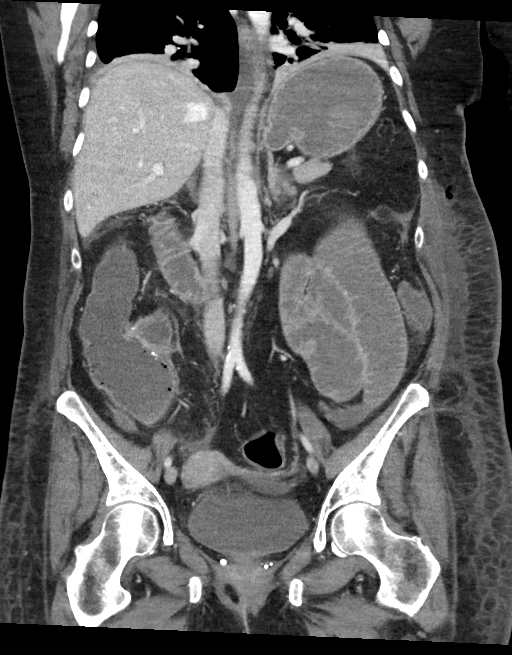

[15 of 46 positions shown; findings below may reference images not displayed]

FINDINGS: Lower chest: Hypoventilatory changes are seen at the lung bases,
with bilateral lower lobe atelectasis. There are trace bilateral
pleural effusions. There is minimal gas within the epiphrenic fat
consistent with recent laparoscopic procedure.

Hepatobiliary: No focal liver abnormality is seen. No gallstones,
gallbladder wall thickening, or biliary dilatation.

Pancreas: Unremarkable. No pancreatic ductal dilatation or
surrounding inflammatory changes.

Spleen: Normal in size without focal abnormality.

Adrenals/Urinary Tract: Adrenal glands are unremarkable. Kidneys are
normal, without renal calculi, focal lesion, or hydronephrosis.
Bladder is unremarkable.

Stomach/Bowel: There is diffuse gaseous distension of the large and
small bowel likely representing postoperative ileus. Gas fluid
levels are seen within the small bowel and colon, to the level of
the splenic flexure.

There is wall thickening of the cecum at the site of the ileocolic
anastomosis. Punctate foci of gas within the non dependent colonic
wall consistent with pneumatosis, nonspecific given recent surgical
intervention. Continued follow-up is recommended to exclude
underlying ischemia. There is no evidence of anastomotic breakdown,
fluid collection, or abscess.

Vascular/Lymphatic: Aortic atherosclerosis. No enlarged abdominal or
pelvic lymph nodes.

Reproductive: Uterus and bilateral adnexa are unremarkable.

Other: Postsurgical changes are seen from ileostomy takedown and
parastomal hernia repair. No evidence of residual or recurrent
ventral hernia. There is subcutaneous fat stranding within the
anterior abdominal wall, with punctate foci of subcutaneous gas,
consistent with recent surgical intervention. No fluid collection or
abscess.

There is trace free fluid within the lower abdomen and pelvis. No
fluid collection or abscess.

Musculoskeletal: No acute or destructive bony lesions. Reconstructed
images demonstrate no additional findings.
IMPRESSION: 1. Postoperative changes from recent laparoscopic surgery, ostomy
takedown, and left lower quadrant parastomal hernia repair.
2. Mild wall thickening and pneumatosis within the proximal colon at
the site of ileocolic anastomosis. This is a nonspecific finding
given recent surgical intervention, and continued imaging follow-up
is recommended to exclude underlying ischemia.
3. Diffuse distension of large and small bowel, with scattered gas
fluid levels, most consistent with postoperative ileus.
4. No evidence of fluid collection or abscess within the abdomen,
pelvis, or abdominal wall. Trace ascites as above.
5. Small bilateral pleural effusions.

## 2020-09-05 DIAGNOSIS — Z23 Encounter for immunization: Secondary | ICD-10-CM | POA: Diagnosis not present

## 2020-09-27 DIAGNOSIS — J189 Pneumonia, unspecified organism: Secondary | ICD-10-CM | POA: Diagnosis not present

## 2020-09-27 DIAGNOSIS — Z20822 Contact with and (suspected) exposure to covid-19: Secondary | ICD-10-CM | POA: Diagnosis not present

## 2020-09-27 DIAGNOSIS — R091 Pleurisy: Secondary | ICD-10-CM | POA: Diagnosis not present

## 2020-09-27 DIAGNOSIS — R000 Tachycardia, unspecified: Secondary | ICD-10-CM | POA: Diagnosis not present

## 2020-11-05 ENCOUNTER — Other Ambulatory Visit: Payer: Self-pay | Admitting: Family Medicine

## 2020-11-05 DIAGNOSIS — Z1231 Encounter for screening mammogram for malignant neoplasm of breast: Secondary | ICD-10-CM

## 2020-11-09 DIAGNOSIS — R918 Other nonspecific abnormal finding of lung field: Secondary | ICD-10-CM | POA: Diagnosis not present

## 2020-11-09 DIAGNOSIS — K439 Ventral hernia without obstruction or gangrene: Secondary | ICD-10-CM | POA: Diagnosis not present

## 2020-11-09 DIAGNOSIS — C20 Malignant neoplasm of rectum: Secondary | ICD-10-CM | POA: Diagnosis not present

## 2020-11-13 DIAGNOSIS — C20 Malignant neoplasm of rectum: Secondary | ICD-10-CM | POA: Diagnosis not present

## 2020-12-04 ENCOUNTER — Ambulatory Visit
Admission: RE | Admit: 2020-12-04 | Discharge: 2020-12-04 | Disposition: A | Discharge status: Home / Self Care | Payer: BC Managed Care – PPO | Source: Ambulatory Visit

## 2020-12-04 ENCOUNTER — Other Ambulatory Visit: Payer: Self-pay

## 2020-12-04 DIAGNOSIS — Z1231 Encounter for screening mammogram for malignant neoplasm of breast: Secondary | ICD-10-CM

## 2021-02-15 ENCOUNTER — Encounter (INDEPENDENT_AMBULATORY_CARE_PROVIDER_SITE_OTHER): Payer: BC Managed Care – PPO | Admitting: Ophthalmology

## 2021-02-15 ENCOUNTER — Other Ambulatory Visit: Payer: Self-pay

## 2021-02-15 DIAGNOSIS — H35372 Puckering of macula, left eye: Secondary | ICD-10-CM | POA: Diagnosis not present

## 2021-02-15 DIAGNOSIS — H43813 Vitreous degeneration, bilateral: Secondary | ICD-10-CM | POA: Diagnosis not present

## 2021-02-15 DIAGNOSIS — H2513 Age-related nuclear cataract, bilateral: Secondary | ICD-10-CM

## 2021-02-15 DIAGNOSIS — H33302 Unspecified retinal break, left eye: Secondary | ICD-10-CM | POA: Diagnosis not present

## 2021-03-12 DIAGNOSIS — R221 Localized swelling, mass and lump, neck: Secondary | ICD-10-CM | POA: Diagnosis not present

## 2021-03-15 DIAGNOSIS — E041 Nontoxic single thyroid nodule: Secondary | ICD-10-CM | POA: Diagnosis not present

## 2021-03-15 DIAGNOSIS — R221 Localized swelling, mass and lump, neck: Secondary | ICD-10-CM | POA: Diagnosis not present

## 2021-03-15 DIAGNOSIS — C20 Malignant neoplasm of rectum: Secondary | ICD-10-CM | POA: Diagnosis not present

## 2021-03-15 DIAGNOSIS — R590 Localized enlarged lymph nodes: Secondary | ICD-10-CM | POA: Diagnosis not present

## 2021-03-19 DIAGNOSIS — C20 Malignant neoplasm of rectum: Secondary | ICD-10-CM | POA: Diagnosis not present

## 2021-03-22 DIAGNOSIS — E041 Nontoxic single thyroid nodule: Secondary | ICD-10-CM | POA: Diagnosis not present

## 2021-03-27 ENCOUNTER — Other Ambulatory Visit: Payer: Self-pay | Admitting: Family Medicine

## 2021-03-27 DIAGNOSIS — E041 Nontoxic single thyroid nodule: Secondary | ICD-10-CM

## 2021-04-02 ENCOUNTER — Ambulatory Visit: Payer: BC Managed Care – PPO | Admitting: Obstetrics and Gynecology

## 2021-04-02 ENCOUNTER — Encounter: Payer: Self-pay | Admitting: Obstetrics and Gynecology

## 2021-04-02 ENCOUNTER — Other Ambulatory Visit: Payer: Self-pay

## 2021-04-02 VITALS — BP 110/72 | HR 80 | Resp 16 | Wt 166.0 lb

## 2021-04-02 DIAGNOSIS — N760 Acute vaginitis: Secondary | ICD-10-CM | POA: Diagnosis not present

## 2021-04-02 DIAGNOSIS — B373 Candidiasis of vulva and vagina: Secondary | ICD-10-CM

## 2021-04-02 DIAGNOSIS — B3731 Acute candidiasis of vulva and vagina: Secondary | ICD-10-CM

## 2021-04-02 LAB — WET PREP FOR TRICH, YEAST, CLUE

## 2021-04-02 MED ORDER — FLUCONAZOLE 150 MG PO TABS: 150.0000 mg | ORAL_TABLET | Freq: Once | ORAL | 0 refills | Status: AC

## 2021-04-02 MED ORDER — BETAMETHASONE VALERATE 0.1 % EX OINT: 1.0000 "application " | TOPICAL_OINTMENT | Freq: Two times a day (BID) | CUTANEOUS | 0 refills | Status: AC

## 2021-04-02 NOTE — Patient Instructions (Addendum)
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
## 2021-04-08 DIAGNOSIS — Z980 Intestinal bypass and anastomosis status: Secondary | ICD-10-CM | POA: Diagnosis not present

## 2021-04-08 DIAGNOSIS — R590 Localized enlarged lymph nodes: Secondary | ICD-10-CM | POA: Diagnosis not present

## 2021-04-08 DIAGNOSIS — Z87891 Personal history of nicotine dependence: Secondary | ICD-10-CM | POA: Diagnosis not present

## 2021-04-08 DIAGNOSIS — Z85048 Personal history of other malignant neoplasm of rectum, rectosigmoid junction, and anus: Secondary | ICD-10-CM | POA: Diagnosis not present

## 2021-04-08 DIAGNOSIS — R599 Enlarged lymph nodes, unspecified: Secondary | ICD-10-CM | POA: Diagnosis not present

## 2021-04-08 DIAGNOSIS — C20 Malignant neoplasm of rectum: Secondary | ICD-10-CM | POA: Diagnosis not present

## 2021-04-11 ENCOUNTER — Other Ambulatory Visit (HOSPITAL_COMMUNITY)
Admission: RE | Admit: 2021-04-11 | Discharge: 2021-04-11 | Disposition: A | Discharge status: Home / Self Care | Payer: BC Managed Care – PPO | Source: Ambulatory Visit | Attending: Family Medicine | Admitting: Family Medicine

## 2021-04-11 ENCOUNTER — Ambulatory Visit
Admission: RE | Admit: 2021-04-11 | Discharge: 2021-04-11 | Disposition: A | Discharge status: Home / Self Care | Payer: BC Managed Care – PPO | Source: Ambulatory Visit | Attending: Family Medicine | Admitting: Family Medicine

## 2021-04-11 DIAGNOSIS — E041 Nontoxic single thyroid nodule: Secondary | ICD-10-CM | POA: Diagnosis not present

## 2021-04-11 DIAGNOSIS — D34 Benign neoplasm of thyroid gland: Secondary | ICD-10-CM | POA: Insufficient documentation

## 2021-04-12 LAB — CYTOLOGY - NON PAP

## 2021-04-16 DIAGNOSIS — C20 Malignant neoplasm of rectum: Secondary | ICD-10-CM | POA: Diagnosis not present

## 2021-05-09 ENCOUNTER — Encounter: Payer: Self-pay | Admitting: Obstetrics & Gynecology

## 2021-05-09 ENCOUNTER — Other Ambulatory Visit: Payer: Self-pay

## 2021-05-09 ENCOUNTER — Other Ambulatory Visit (HOSPITAL_COMMUNITY)
Admission: RE | Admit: 2021-05-09 | Discharge: 2021-05-09 | Disposition: A | Discharge status: Home / Self Care | Payer: BC Managed Care – PPO | Source: Ambulatory Visit | Attending: Obstetrics & Gynecology | Admitting: Obstetrics & Gynecology

## 2021-05-09 ENCOUNTER — Ambulatory Visit (INDEPENDENT_AMBULATORY_CARE_PROVIDER_SITE_OTHER): Payer: BC Managed Care – PPO | Admitting: Obstetrics & Gynecology

## 2021-05-09 VITALS — BP 120/78 | Ht 61.0 in | Wt 168.0 lb

## 2021-05-09 DIAGNOSIS — Z85048 Personal history of other malignant neoplasm of rectum, rectosigmoid junction, and anus: Secondary | ICD-10-CM

## 2021-05-09 DIAGNOSIS — Z780 Asymptomatic menopausal state: Secondary | ICD-10-CM | POA: Diagnosis not present

## 2021-05-09 DIAGNOSIS — M8589 Other specified disorders of bone density and structure, multiple sites: Secondary | ICD-10-CM

## 2021-05-09 DIAGNOSIS — Z01419 Encounter for gynecological examination (general) (routine) without abnormal findings: Secondary | ICD-10-CM | POA: Insufficient documentation

## 2021-05-09 NOTE — Progress Notes (Signed)
Emily Holloway 05/25/1956 [id number]   History:    65 y.o. G0 Married   RP:  Established patient presenting for annual gyn exam    HPI:  Postmenopause, well on no HRT.  No PMB.  No pelvic pain.  Abstinent because husband is concerned about risks of recurrent vaginal rectal fistula.  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  Followed by Oncologist.  Had hernia repair and reversal of Colostomy bag in 2021.  Colonoscopy 08/2020 at [facility]. Urine/BMs normal.  Breasts wnl.  BMI 31.74.  Walking.  Health labs with family physician.   Past medical history,surgical history, family history and social history were all reviewed and documented in the EPIC chart.  Gynecologic History No LMP recorded. Patient is postmenopausal.  Obstetric History OB History  Gravida Para Term Preterm AB Living  0 0 0 0 0 0  SAB IAB Ectopic Multiple Live Births  0 0 0 0 0     ROS: A ROS was performed and pertinent positives and negatives are included in the history.  GENERAL: No fevers or chills. HEENT: No change in vision, no earache, sore throat or sinus congestion. NECK: No pain or stiffness. CARDIOVASCULAR: No chest pain or pressure. No palpitations. PULMONARY: No shortness of breath, cough or wheeze. GASTROINTESTINAL: No abdominal pain, nausea, vomiting or diarrhea, melena or bright red blood per rectum. GENITOURINARY: No urinary frequency, urgency, hesitancy or dysuria. MUSCULOSKELETAL: No joint or muscle pain, no back pain, no recent trauma. DERMATOLOGIC: No rash, no itching, no lesions. ENDOCRINE: No polyuria, polydipsia, no heat or cold intolerance. No recent change in weight. HEMATOLOGICAL: No anemia or easy bruising or bleeding. NEUROLOGIC: No headache, seizures, numbness, tingling or weakness. PSYCHIATRIC: No depression, no loss of interest in normal activity or change in sleep pattern.     Exam:   BP 120/78 (BP Location: Right Arm, Patient Position:  Sitting, Cuff Size: Normal)   Ht 5\' 1"  (1.549 m)   Wt 168 lb (76.2 kg)   BMI 31.74 kg/m   Body mass index is 31.74 kg/m.  General appearance : Well developed well nourished female. No acute distress HEENT: Eyes: no retinal hemorrhage or exudates,  Neck supple, trachea midline, no carotid bruits, no thyroidmegaly Lungs: Clear to auscultation, no rhonchi or wheezes, or rib retractions  Heart: Regular rate and rhythm, no murmurs or gallops Breast:Examined in sitting and supine position were symmetrical in appearance, no palpable masses or tenderness,  no skin retraction, no nipple inversion, no nipple discharge, no skin discoloration, no axillary or supraclavicular lymphadenopathy Abdomen: no palpable masses or tenderness, no rebound or guarding Extremities: no edema or skin discoloration or tenderness  Pelvic: Vulva: Normal             Vagina: No gross lesions or discharge  Cervix: No gross lesions or discharge.  Pap reflex done.  Uterus  AV, normal size, shape and consistency, non-tender and mobile  Adnexa  Without masses or tenderness  Anus: Normal   Assessment/Plan:  65 y.o. female for annual exam   1. Encounter for routine gynecological examination with Papanicolaou smear of cervix Gynecologic exam normal and postmenopausal.  Pap reflex done.  Breast exam normal.  Screening mammogram - May 2022.  Body mass index 31.74.  Mild decrease in calories/carbs recommended.  Aerobic activities 5 times a week and light weightlifting every 2 days.  Health labs with family physician. - Cytology - PAP( [place])  2. Postmenopause  Well on no hormone replacement therapy.  No postmenopausal bleeding.  3. Osteopenia of multiple sites BD 05/2020.  Continue with vitamin D supplements, calcium intake of 1.5 g/day and regular weightbearing physical activities.  4. History of rectal cancer, T2N0, Nov2010  Dx of Colon/Rectal Ca in 2010, mets to lungs resected in 05/2013, then ChemoRx.  Followed by  Oncologist.  Had hernia repair and reversal of Colostomy bag in 2021. Colonoscopy 08/2020 at [place] [facility] MD, 3:34 PM 05/09/2021

## 2021-05-13 LAB — CYTOLOGY - PAP: Diagnosis: NEGATIVE

## 2021-05-21 DIAGNOSIS — E7800 Pure hypercholesterolemia, unspecified: Secondary | ICD-10-CM | POA: Diagnosis not present

## 2021-05-21 DIAGNOSIS — Z0000 Encounter for general adult medical examination without abnormal findings: Secondary | ICD-10-CM | POA: Diagnosis not present

## 2021-06-28 DIAGNOSIS — C20 Malignant neoplasm of rectum: Secondary | ICD-10-CM | POA: Diagnosis not present

## 2021-06-28 DIAGNOSIS — R590 Localized enlarged lymph nodes: Secondary | ICD-10-CM | POA: Diagnosis not present

## 2021-07-02 DIAGNOSIS — C20 Malignant neoplasm of rectum: Secondary | ICD-10-CM | POA: Diagnosis not present

## 2021-07-02 DIAGNOSIS — E041 Nontoxic single thyroid nodule: Secondary | ICD-10-CM | POA: Diagnosis not present

## 2021-08-28 DIAGNOSIS — R7309 Other abnormal glucose: Secondary | ICD-10-CM | POA: Diagnosis not present

## 2021-09-25 DIAGNOSIS — R7309 Other abnormal glucose: Secondary | ICD-10-CM | POA: Diagnosis not present

## 2021-10-26 DIAGNOSIS — R7309 Other abnormal glucose: Secondary | ICD-10-CM | POA: Diagnosis not present

## 2021-11-25 DIAGNOSIS — R7309 Other abnormal glucose: Secondary | ICD-10-CM | POA: Diagnosis not present

## 2021-12-12 ENCOUNTER — Other Ambulatory Visit: Payer: Self-pay | Admitting: Family Medicine

## 2021-12-12 DIAGNOSIS — Z1231 Encounter for screening mammogram for malignant neoplasm of breast: Secondary | ICD-10-CM

## 2021-12-17 ENCOUNTER — Ambulatory Visit
Admission: RE | Admit: 2021-12-17 | Discharge: 2021-12-17 | Disposition: A | Discharge status: Home / Self Care | Payer: BC Managed Care – PPO | Source: Ambulatory Visit

## 2021-12-17 DIAGNOSIS — Z1231 Encounter for screening mammogram for malignant neoplasm of breast: Secondary | ICD-10-CM

## 2021-12-26 DIAGNOSIS — R7309 Other abnormal glucose: Secondary | ICD-10-CM | POA: Diagnosis not present

## 2022-01-03 DIAGNOSIS — K7689 Other specified diseases of liver: Secondary | ICD-10-CM | POA: Diagnosis not present

## 2022-01-03 DIAGNOSIS — C7800 Secondary malignant neoplasm of unspecified lung: Secondary | ICD-10-CM | POA: Diagnosis not present

## 2022-01-03 DIAGNOSIS — R911 Solitary pulmonary nodule: Secondary | ICD-10-CM | POA: Diagnosis not present

## 2022-01-03 DIAGNOSIS — C20 Malignant neoplasm of rectum: Secondary | ICD-10-CM | POA: Diagnosis not present

## 2022-01-07 DIAGNOSIS — C20 Malignant neoplasm of rectum: Secondary | ICD-10-CM | POA: Diagnosis not present

## 2022-01-25 DIAGNOSIS — R7309 Other abnormal glucose: Secondary | ICD-10-CM | POA: Diagnosis not present

## 2022-02-14 ENCOUNTER — Encounter (INDEPENDENT_AMBULATORY_CARE_PROVIDER_SITE_OTHER): Payer: BC Managed Care – PPO | Admitting: Ophthalmology

## 2022-02-14 DIAGNOSIS — H35372 Puckering of macula, left eye: Secondary | ICD-10-CM

## 2022-02-14 DIAGNOSIS — H43813 Vitreous degeneration, bilateral: Secondary | ICD-10-CM

## 2022-02-14 DIAGNOSIS — H33302 Unspecified retinal break, left eye: Secondary | ICD-10-CM

## 2022-02-25 DIAGNOSIS — R7309 Other abnormal glucose: Secondary | ICD-10-CM | POA: Diagnosis not present

## 2022-03-06 DIAGNOSIS — Z713 Dietary counseling and surveillance: Secondary | ICD-10-CM | POA: Diagnosis not present

## 2022-03-28 DIAGNOSIS — R7309 Other abnormal glucose: Secondary | ICD-10-CM | POA: Diagnosis not present

## 2022-04-27 DIAGNOSIS — R7309 Other abnormal glucose: Secondary | ICD-10-CM | POA: Diagnosis not present

## 2022-05-28 DIAGNOSIS — R7309 Other abnormal glucose: Secondary | ICD-10-CM | POA: Diagnosis not present

## 2022-06-05 DIAGNOSIS — H53143 Visual discomfort, bilateral: Secondary | ICD-10-CM | POA: Diagnosis not present

## 2022-06-27 DIAGNOSIS — R7309 Other abnormal glucose: Secondary | ICD-10-CM | POA: Diagnosis not present

## 2022-07-01 DIAGNOSIS — D225 Melanocytic nevi of trunk: Secondary | ICD-10-CM | POA: Diagnosis not present

## 2022-07-01 DIAGNOSIS — Z1283 Encounter for screening for malignant neoplasm of skin: Secondary | ICD-10-CM | POA: Diagnosis not present

## 2022-07-02 DIAGNOSIS — Z0000 Encounter for general adult medical examination without abnormal findings: Secondary | ICD-10-CM | POA: Diagnosis not present

## 2022-07-02 DIAGNOSIS — Z131 Encounter for screening for diabetes mellitus: Secondary | ICD-10-CM | POA: Diagnosis not present

## 2022-07-02 DIAGNOSIS — E7800 Pure hypercholesterolemia, unspecified: Secondary | ICD-10-CM | POA: Diagnosis not present

## 2022-07-28 DIAGNOSIS — R7309 Other abnormal glucose: Secondary | ICD-10-CM | POA: Diagnosis not present

## 2022-08-28 DIAGNOSIS — R7309 Other abnormal glucose: Secondary | ICD-10-CM | POA: Diagnosis not present

## 2022-09-13 IMAGING — MG MM DIGITAL SCREENING BILAT W/ TOMO AND CAD
8 series · 8 of 24 positions shown · non-contrast
Comparison: Previous exam(s).

CLINICAL DATA: Screening.

EXAM:
DIGITAL SCREENING BILATERAL MAMMOGRAM WITH TOMOSYNTHESIS AND CAD
TECHNIQUE: Bilateral screening digital craniocaudal and mediolateral oblique
mammograms were obtained. Bilateral screening digital breast
tomosynthesis was performed. The images were evaluated with
computer-aided detection.

[R CC synth-2D]
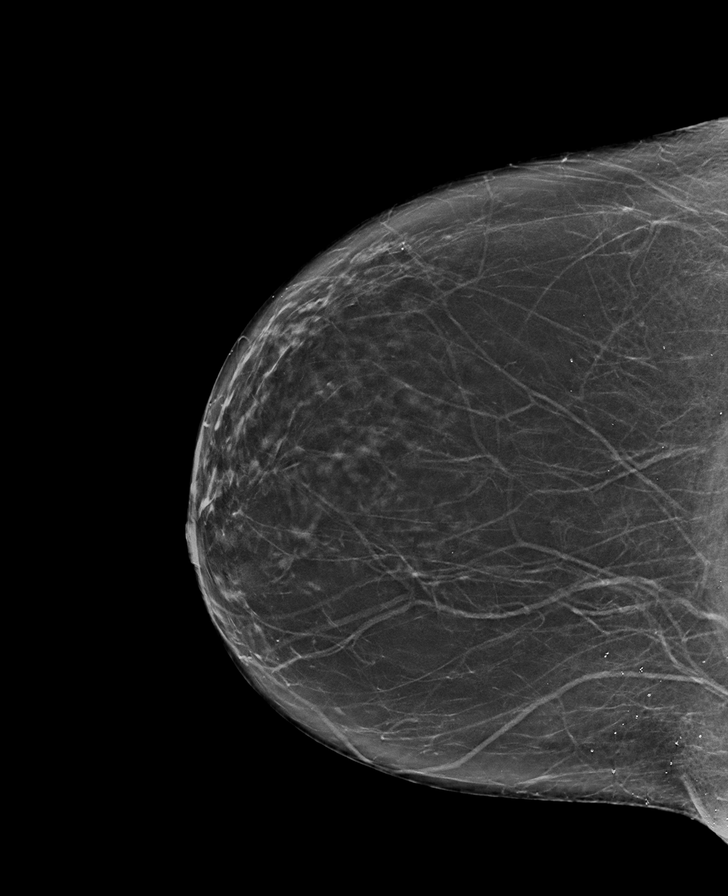

[L CC synth-2D]
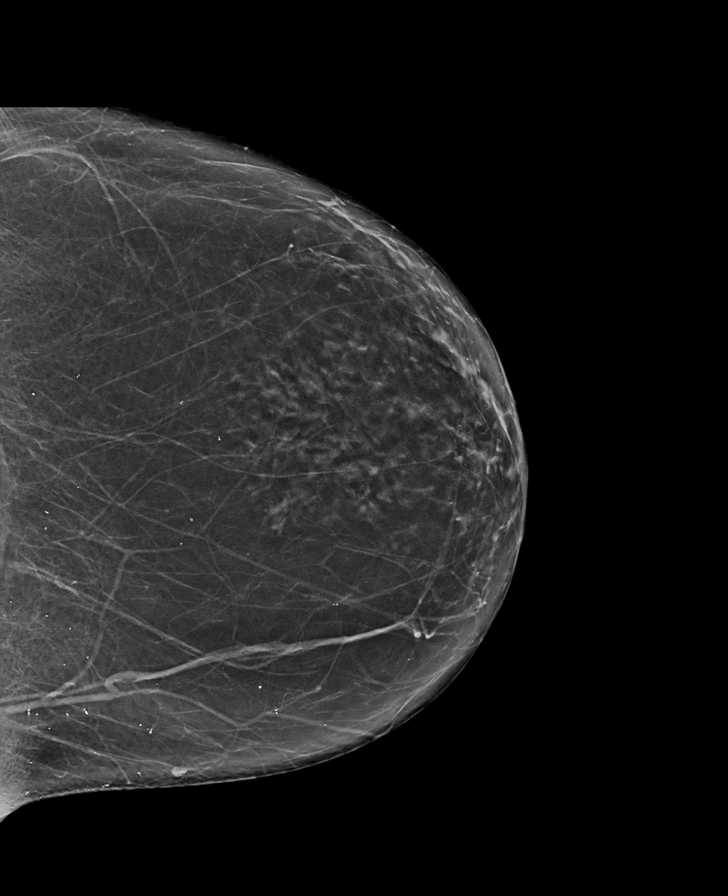

[L MLO synth-2D]
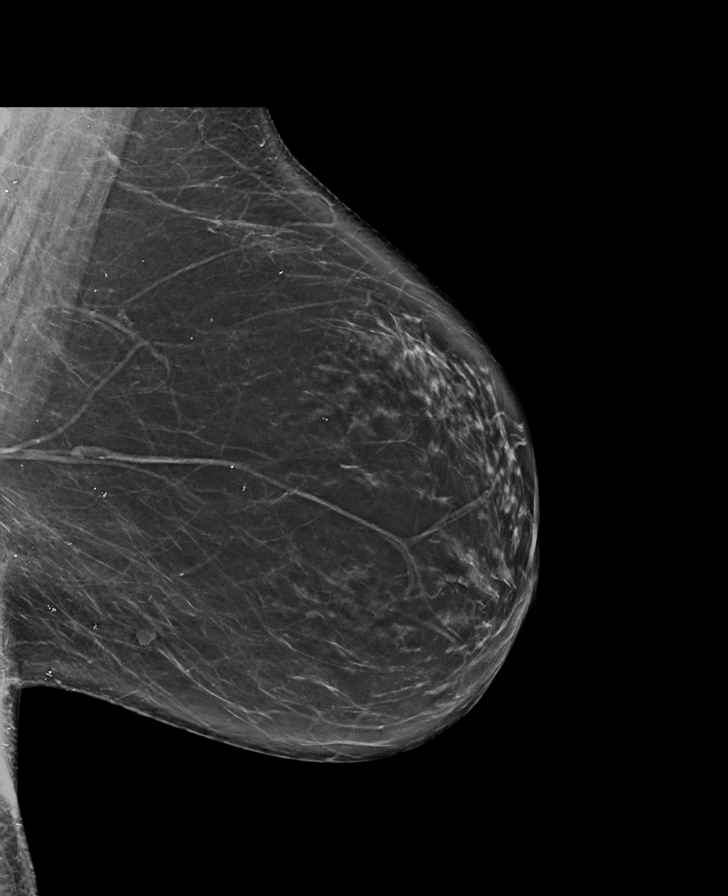

[R MLO synth-2D]
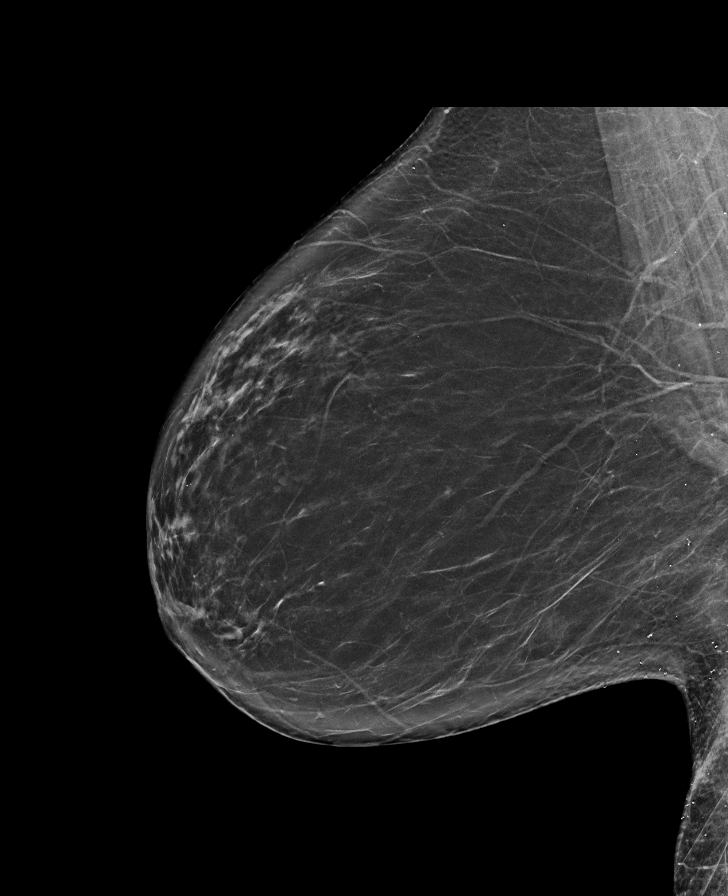

[R CC tomo · tomo slice 37/72.0]
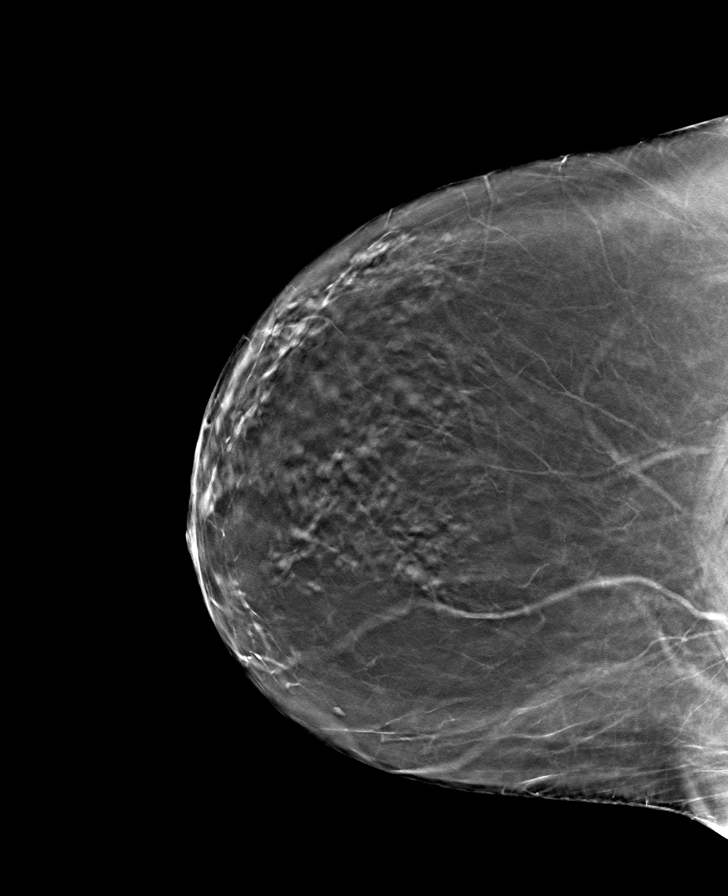

[R MLO tomo · tomo slice 39/78.0]
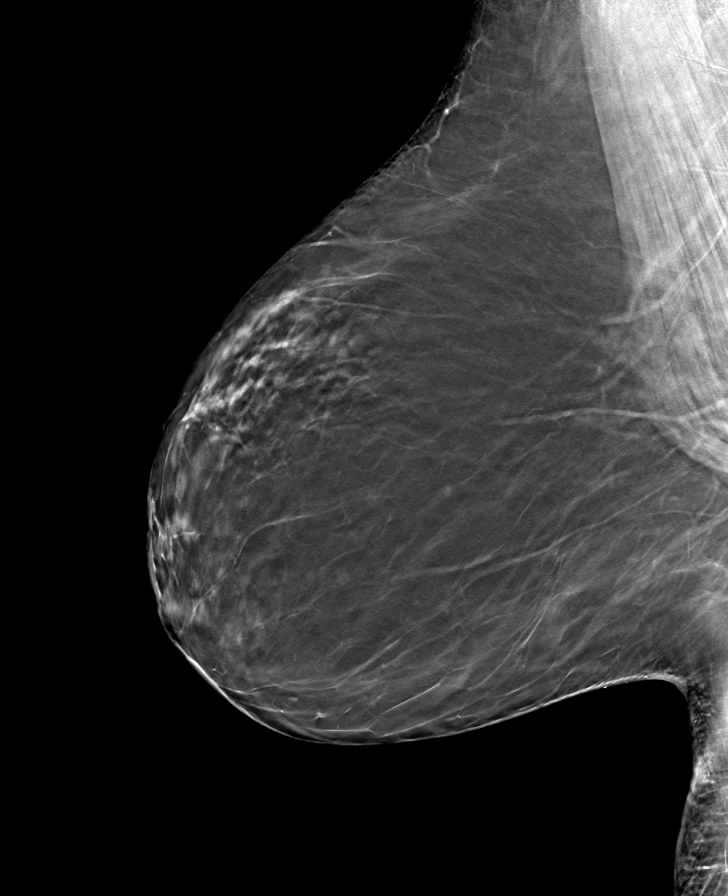

[L MLO tomo · tomo slice 41/80.0]
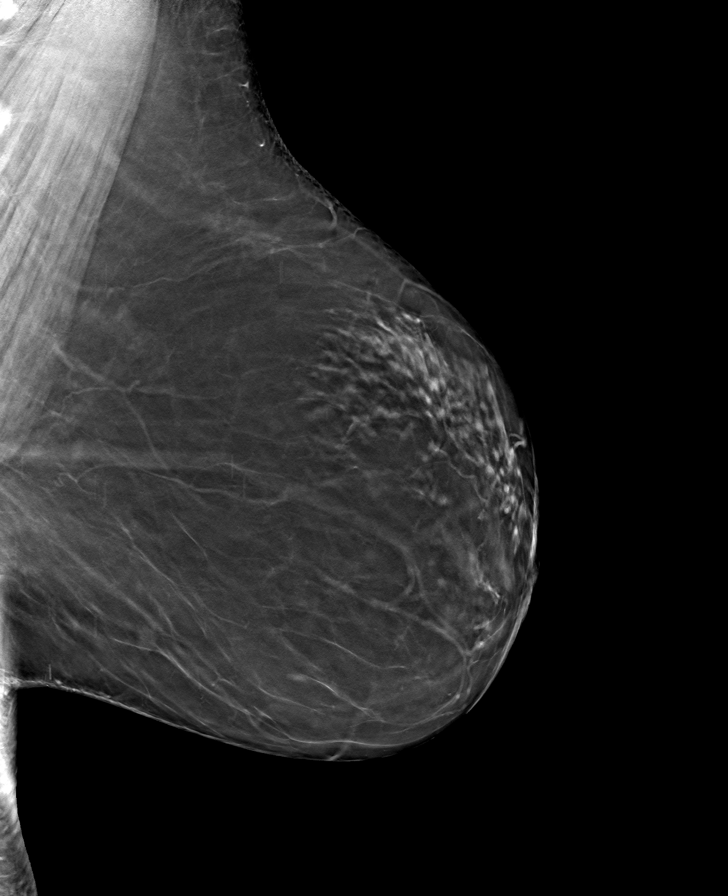

[L CC tomo · tomo slice 35/68.0]
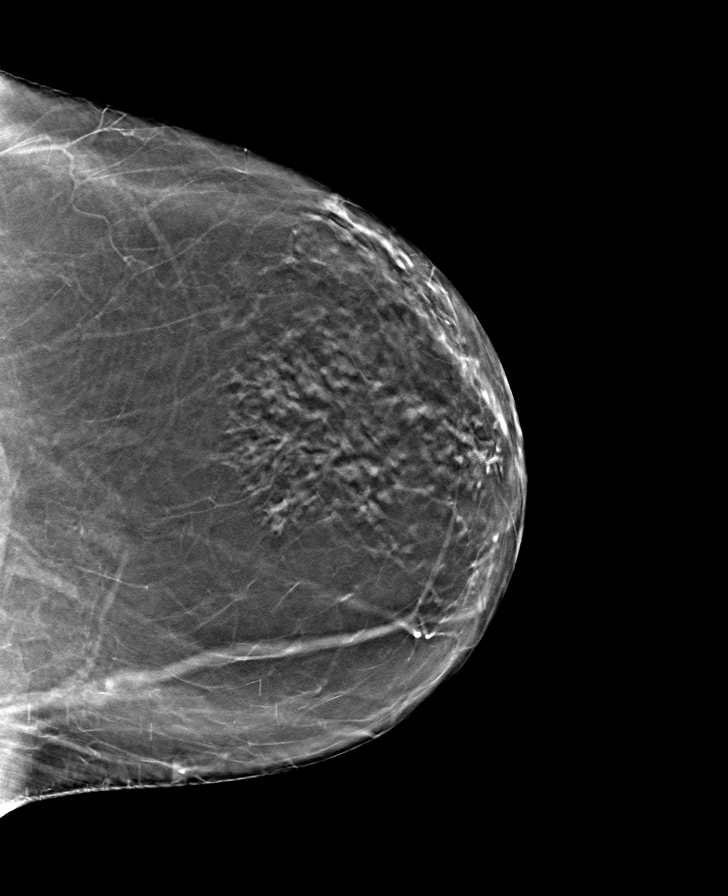

[8 of 24 positions shown; findings below may reference images not displayed]

ACR Breast Density Category b: There are scattered areas of
fibroglandular density.
FINDINGS: There are no findings suspicious for malignancy.
IMPRESSION: No mammographic evidence of malignancy. A result letter of this
screening mammogram will be mailed directly to the patient.

RECOMMENDATION:
Screening mammogram in one year. (Code:51-O-LD2)

BI-RADS CATEGORY  1: Negative.

## 2022-09-26 DIAGNOSIS — R7309 Other abnormal glucose: Secondary | ICD-10-CM | POA: Diagnosis not present

## 2022-10-03 DIAGNOSIS — C20 Malignant neoplasm of rectum: Secondary | ICD-10-CM | POA: Diagnosis not present

## 2022-10-07 DIAGNOSIS — C20 Malignant neoplasm of rectum: Secondary | ICD-10-CM | POA: Diagnosis not present

## 2022-10-07 DIAGNOSIS — R899 Unspecified abnormal finding in specimens from other organs, systems and tissues: Secondary | ICD-10-CM | POA: Diagnosis not present

## 2022-10-07 DIAGNOSIS — R590 Localized enlarged lymph nodes: Secondary | ICD-10-CM | POA: Diagnosis not present

## 2022-10-27 DIAGNOSIS — R7309 Other abnormal glucose: Secondary | ICD-10-CM | POA: Diagnosis not present

## 2022-11-11 DIAGNOSIS — R221 Localized swelling, mass and lump, neck: Secondary | ICD-10-CM | POA: Diagnosis not present

## 2022-11-11 DIAGNOSIS — R590 Localized enlarged lymph nodes: Secondary | ICD-10-CM | POA: Diagnosis not present

## 2022-11-26 DIAGNOSIS — R7309 Other abnormal glucose: Secondary | ICD-10-CM | POA: Diagnosis not present

## 2022-12-05 DIAGNOSIS — R221 Localized swelling, mass and lump, neck: Secondary | ICD-10-CM | POA: Diagnosis not present

## 2022-12-10 ENCOUNTER — Other Ambulatory Visit: Payer: Self-pay | Admitting: Otolaryngology

## 2022-12-10 DIAGNOSIS — R221 Localized swelling, mass and lump, neck: Secondary | ICD-10-CM

## 2022-12-10 NOTE — Progress Notes (Signed)
Simonne Come, MD  Leodis Rains D No Bx.  Recommend neck MRI as was recommended on outside neck CT.  (Note, an order was placed for this to be biopsied at [facility] as well).  Nedra Hai

## 2022-12-11 ENCOUNTER — Other Ambulatory Visit: Payer: Self-pay | Admitting: Family Medicine

## 2022-12-11 ENCOUNTER — Other Ambulatory Visit (HOSPITAL_COMMUNITY): Payer: Self-pay | Admitting: Otolaryngology

## 2022-12-11 DIAGNOSIS — R221 Localized swelling, mass and lump, neck: Secondary | ICD-10-CM

## 2022-12-11 DIAGNOSIS — Z1231 Encounter for screening mammogram for malignant neoplasm of breast: Secondary | ICD-10-CM

## 2022-12-16 ENCOUNTER — Other Ambulatory Visit: Payer: Self-pay | Admitting: Otolaryngology

## 2022-12-16 DIAGNOSIS — R221 Localized swelling, mass and lump, neck: Secondary | ICD-10-CM

## 2022-12-18 NOTE — Progress Notes (Signed)
Skotnicki, Meghan A, DO  Leodis Rains D We were told by radiologist that biopsy is not recommended. so we are awaiting results of MRI.       Previous Messages    ----- Message ----- From: Leodis Rains D Sent: 12/17/2022   4:08 PM EDT To: Skeet Latch Skotnicki, DO Subject: BX                                            Good afternoon!  Her Mri is scheduled at [facility] for 01/28/23.  Also our IR doctor saw that the bx was requested to be done in [facility]. We do not schedule at that location You will need to send the BX order to that location

## 2022-12-27 DIAGNOSIS — R7309 Other abnormal glucose: Secondary | ICD-10-CM | POA: Diagnosis not present

## 2022-12-31 ENCOUNTER — Ambulatory Visit
Admission: RE | Admit: 2022-12-31 | Discharge: 2022-12-31 | Disposition: A | Discharge status: Home / Self Care | Payer: BC Managed Care – PPO | Source: Ambulatory Visit

## 2022-12-31 DIAGNOSIS — Z1231 Encounter for screening mammogram for malignant neoplasm of breast: Secondary | ICD-10-CM

## 2023-01-26 DIAGNOSIS — R7309 Other abnormal glucose: Secondary | ICD-10-CM | POA: Diagnosis not present

## 2023-01-28 ENCOUNTER — Ambulatory Visit
Admission: RE | Admit: 2023-01-28 | Discharge: 2023-01-28 | Disposition: A | Discharge status: Home / Self Care | Payer: BC Managed Care – PPO | Source: Ambulatory Visit | Attending: Otolaryngology | Admitting: Otolaryngology

## 2023-01-28 DIAGNOSIS — R221 Localized swelling, mass and lump, neck: Secondary | ICD-10-CM

## 2023-02-04 ENCOUNTER — Other Ambulatory Visit (HOSPITAL_COMMUNITY): Payer: Self-pay | Admitting: Otolaryngology

## 2023-02-04 DIAGNOSIS — R221 Localized swelling, mass and lump, neck: Secondary | ICD-10-CM

## 2023-02-13 ENCOUNTER — Encounter (INDEPENDENT_AMBULATORY_CARE_PROVIDER_SITE_OTHER): Payer: BC Managed Care – PPO | Admitting: Ophthalmology

## 2023-02-13 DIAGNOSIS — H43813 Vitreous degeneration, bilateral: Secondary | ICD-10-CM

## 2023-02-13 DIAGNOSIS — H2513 Age-related nuclear cataract, bilateral: Secondary | ICD-10-CM

## 2023-02-13 DIAGNOSIS — H35372 Puckering of macula, left eye: Secondary | ICD-10-CM

## 2023-02-13 DIAGNOSIS — H33302 Unspecified retinal break, left eye: Secondary | ICD-10-CM

## 2023-02-14 ENCOUNTER — Ambulatory Visit (HOSPITAL_COMMUNITY): Admission: RE | Admit: 2023-02-14 | Payer: BC Managed Care – PPO | Source: Ambulatory Visit

## 2023-02-14 ENCOUNTER — Encounter (HOSPITAL_COMMUNITY): Payer: Self-pay

## 2023-02-14 ENCOUNTER — Ambulatory Visit (HOSPITAL_COMMUNITY): Payer: BC Managed Care – PPO

## 2023-02-23 NOTE — Progress Notes (Signed)
Skotnicki, Meghan A, DO  Rushie Brazel D No please do the biopsy!! That's all I ever wanted done for this patient-- the MRI was ordered accidentally by someone in my office, and I co signed without double checking the patients chart.

## 2023-02-23 NOTE — Progress Notes (Signed)
Oley Balm, MD  Leodis Rains D PROCEDURE / BIOPSY REVIEW Date: 02/23/23  Requested Biopsy site: L parotid Reason for request: mass Nodular mass inseparable from the left parotid gland tail, possibly representing an enlarged lymph node adjacent to the parotid tail or a primary parotid neoplasm. Consider ENT referral and further characterization with contrasted MRI and/or PET/CT. FNA of the above-described nodular density at the left parotid tail would most likely provide a definitive diagnosis and may indeed be preferable to follow-up imaging unless clinically contraindicated.   Imaging review: Best seen on CT 11/11/22 Im [address] (from [facility])  Decision: Approved Imaging modality to perform: Ultrasound Schedule with: Patient preference (Local vs Mod Sed) Schedule for: Any VIR  Additional comments:   Please contact me with questions, concerns, or if issue pertaining to this request arise.  Dayne Oley Balm, MD [facility] Radiology

## 2023-02-26 DIAGNOSIS — R7309 Other abnormal glucose: Secondary | ICD-10-CM | POA: Diagnosis not present

## 2023-03-03 ENCOUNTER — Other Ambulatory Visit (HOSPITAL_COMMUNITY): Payer: Self-pay | Admitting: Student

## 2023-03-03 ENCOUNTER — Ambulatory Visit (HOSPITAL_COMMUNITY)
Admission: RE | Admit: 2023-03-03 | Discharge: 2023-03-03 | Disposition: A | Discharge status: Home / Self Care | Payer: BC Managed Care – PPO | Source: Ambulatory Visit | Attending: Otolaryngology | Admitting: Otolaryngology

## 2023-03-03 DIAGNOSIS — R221 Localized swelling, mass and lump, neck: Secondary | ICD-10-CM

## 2023-03-03 DIAGNOSIS — K118 Other diseases of salivary glands: Secondary | ICD-10-CM | POA: Diagnosis not present

## 2023-03-03 DIAGNOSIS — R220 Localized swelling, mass and lump, head: Secondary | ICD-10-CM | POA: Diagnosis not present

## 2023-03-03 MED ORDER — LIDOCAINE HCL (PF) 1 % IJ SOLN
10.0000 mL | Freq: Once | INTRAMUSCULAR | Status: AC
  Administered 2023-03-03: 10 mL via INTRADERMAL

## 2023-03-03 NOTE — Procedures (Signed)
Vascular and Interventional Radiology Procedure Note  Patient: Emily Holloway DOB: 18-Sep-1955 Medical Record Number: [id number] Note Date/Time: 03/03/23 9:13 AM   Performing Physician: Roanna Banning, MD Assistant(s): None  Diagnosis: L parotid tail mass  Procedure: LEFT PAROTID TAIL MASS BIOPSY  Anesthesia: Local Anesthetic Complications: L facial palsy, likely transient from the local anesthetic Estimated Blood Loss: Minimal Specimens: Sent for Pathology  Findings:  Successful Ultrasound-guided biopsy of L parotid tail mass. A total of 3 samples were obtained. Hemostasis of the tract was achieved using Manual Pressure.  Plan: Bed rest for 0 hours.  See detailed procedure note with images in PACS. The patient tolerated the procedure well without incident or complication and was returned to Recovery in stable condition.    Roanna Banning, MD [facility] Radiology   Pager. [phone] Clinic. [phone]

## 2023-03-29 DIAGNOSIS — R7309 Other abnormal glucose: Secondary | ICD-10-CM | POA: Diagnosis not present

## 2023-04-28 DIAGNOSIS — R7309 Other abnormal glucose: Secondary | ICD-10-CM | POA: Diagnosis not present

## 2023-04-30 DIAGNOSIS — G5601 Carpal tunnel syndrome, right upper limb: Secondary | ICD-10-CM | POA: Diagnosis not present

## 2023-05-07 DIAGNOSIS — M79644 Pain in right finger(s): Secondary | ICD-10-CM | POA: Diagnosis not present

## 2023-05-26 DIAGNOSIS — M79644 Pain in right finger(s): Secondary | ICD-10-CM | POA: Diagnosis not present

## 2023-05-29 DIAGNOSIS — R7309 Other abnormal glucose: Secondary | ICD-10-CM | POA: Diagnosis not present

## 2023-06-11 DIAGNOSIS — H43813 Vitreous degeneration, bilateral: Secondary | ICD-10-CM | POA: Diagnosis not present

## 2023-06-11 DIAGNOSIS — H53143 Visual discomfort, bilateral: Secondary | ICD-10-CM | POA: Diagnosis not present

## 2023-06-11 DIAGNOSIS — H04123 Dry eye syndrome of bilateral lacrimal glands: Secondary | ICD-10-CM | POA: Diagnosis not present

## 2023-06-18 DIAGNOSIS — M1811 Unilateral primary osteoarthritis of first carpometacarpal joint, right hand: Secondary | ICD-10-CM | POA: Diagnosis not present

## 2023-06-28 DIAGNOSIS — R7309 Other abnormal glucose: Secondary | ICD-10-CM | POA: Diagnosis not present

## 2023-07-08 DIAGNOSIS — Z0000 Encounter for general adult medical examination without abnormal findings: Secondary | ICD-10-CM | POA: Diagnosis not present

## 2023-07-08 DIAGNOSIS — M8580 Other specified disorders of bone density and structure, unspecified site: Secondary | ICD-10-CM | POA: Diagnosis not present

## 2023-07-08 DIAGNOSIS — E782 Mixed hyperlipidemia: Secondary | ICD-10-CM | POA: Diagnosis not present

## 2023-07-08 DIAGNOSIS — E041 Nontoxic single thyroid nodule: Secondary | ICD-10-CM | POA: Diagnosis not present

## 2023-07-08 DIAGNOSIS — Z131 Encounter for screening for diabetes mellitus: Secondary | ICD-10-CM | POA: Diagnosis not present

## 2023-07-08 DIAGNOSIS — Z85048 Personal history of other malignant neoplasm of rectum, rectosigmoid junction, and anus: Secondary | ICD-10-CM | POA: Diagnosis not present

## 2023-07-09 ENCOUNTER — Other Ambulatory Visit: Payer: Self-pay | Admitting: Family Medicine

## 2023-07-09 DIAGNOSIS — M8588 Other specified disorders of bone density and structure, other site: Secondary | ICD-10-CM

## 2023-08-19 DIAGNOSIS — M1812 Unilateral primary osteoarthritis of first carpometacarpal joint, left hand: Secondary | ICD-10-CM | POA: Diagnosis not present

## 2023-09-25 DIAGNOSIS — J9811 Atelectasis: Secondary | ICD-10-CM | POA: Diagnosis not present

## 2023-09-25 DIAGNOSIS — E041 Nontoxic single thyroid nodule: Secondary | ICD-10-CM | POA: Diagnosis not present

## 2023-09-25 DIAGNOSIS — R590 Localized enlarged lymph nodes: Secondary | ICD-10-CM | POA: Diagnosis not present

## 2023-09-25 DIAGNOSIS — C20 Malignant neoplasm of rectum: Secondary | ICD-10-CM | POA: Diagnosis not present

## 2023-09-25 DIAGNOSIS — R221 Localized swelling, mass and lump, neck: Secondary | ICD-10-CM | POA: Diagnosis not present

## 2023-09-25 DIAGNOSIS — K118 Other diseases of salivary glands: Secondary | ICD-10-CM | POA: Diagnosis not present

## 2023-09-25 DIAGNOSIS — N200 Calculus of kidney: Secondary | ICD-10-CM | POA: Diagnosis not present

## 2023-09-29 DIAGNOSIS — C20 Malignant neoplasm of rectum: Secondary | ICD-10-CM | POA: Diagnosis not present

## 2023-11-11 DIAGNOSIS — E041 Nontoxic single thyroid nodule: Secondary | ICD-10-CM | POA: Diagnosis not present

## 2023-12-08 ENCOUNTER — Other Ambulatory Visit: Payer: Self-pay | Admitting: Family Medicine

## 2023-12-08 DIAGNOSIS — Z1231 Encounter for screening mammogram for malignant neoplasm of breast: Secondary | ICD-10-CM

## 2024-01-07 ENCOUNTER — Encounter

## 2024-01-07 DIAGNOSIS — Z1231 Encounter for screening mammogram for malignant neoplasm of breast: Secondary | ICD-10-CM

## 2024-01-13 DIAGNOSIS — Z713 Dietary counseling and surveillance: Secondary | ICD-10-CM | POA: Diagnosis not present

## 2024-01-14 ENCOUNTER — Ambulatory Visit
Admission: RE | Admit: 2024-01-14 | Discharge: 2024-01-14 | Disposition: A | Discharge status: Home / Self Care | Source: Ambulatory Visit | Attending: Family Medicine | Admitting: Family Medicine

## 2024-01-14 DIAGNOSIS — Z1231 Encounter for screening mammogram for malignant neoplasm of breast: Secondary | ICD-10-CM

## 2024-02-19 ENCOUNTER — Encounter (INDEPENDENT_AMBULATORY_CARE_PROVIDER_SITE_OTHER): Payer: BC Managed Care – PPO | Admitting: Ophthalmology

## 2024-03-01 ENCOUNTER — Other Ambulatory Visit: Payer: BC Managed Care – PPO

## 2024-03-04 ENCOUNTER — Encounter (INDEPENDENT_AMBULATORY_CARE_PROVIDER_SITE_OTHER): Payer: BC Managed Care – PPO | Admitting: Ophthalmology

## 2024-03-04 DIAGNOSIS — H33302 Unspecified retinal break, left eye: Secondary | ICD-10-CM | POA: Diagnosis not present

## 2024-03-04 DIAGNOSIS — H2513 Age-related nuclear cataract, bilateral: Secondary | ICD-10-CM | POA: Diagnosis not present

## 2024-03-04 DIAGNOSIS — H35372 Puckering of macula, left eye: Secondary | ICD-10-CM | POA: Diagnosis not present

## 2024-03-04 DIAGNOSIS — H43813 Vitreous degeneration, bilateral: Secondary | ICD-10-CM | POA: Diagnosis not present

## 2024-04-12 DIAGNOSIS — D225 Melanocytic nevi of trunk: Secondary | ICD-10-CM | POA: Diagnosis not present

## 2024-04-12 DIAGNOSIS — Z1283 Encounter for screening for malignant neoplasm of skin: Secondary | ICD-10-CM | POA: Diagnosis not present

## 2024-04-18 ENCOUNTER — Ambulatory Visit (INDEPENDENT_AMBULATORY_CARE_PROVIDER_SITE_OTHER)
Admission: RE | Admit: 2024-04-18 | Discharge: 2024-04-18 | Disposition: A | Discharge status: Home / Self Care | Source: Ambulatory Visit | Attending: Family Medicine | Admitting: Family Medicine

## 2024-04-18 DIAGNOSIS — M8589 Other specified disorders of bone density and structure, multiple sites: Secondary | ICD-10-CM | POA: Diagnosis not present

## 2024-04-18 DIAGNOSIS — M8588 Other specified disorders of bone density and structure, other site: Secondary | ICD-10-CM

## 2024-04-18 DIAGNOSIS — Z780 Asymptomatic menopausal state: Secondary | ICD-10-CM | POA: Diagnosis not present

## 2024-05-30 ENCOUNTER — Other Ambulatory Visit (HOSPITAL_BASED_OUTPATIENT_CLINIC_OR_DEPARTMENT_OTHER)

## 2024-06-20 DIAGNOSIS — R2241 Localized swelling, mass and lump, right lower limb: Secondary | ICD-10-CM | POA: Diagnosis not present

## 2024-07-08 DIAGNOSIS — E041 Nontoxic single thyroid nodule: Secondary | ICD-10-CM | POA: Diagnosis not present

## 2024-07-08 DIAGNOSIS — R7303 Prediabetes: Secondary | ICD-10-CM | POA: Diagnosis not present

## 2024-07-08 DIAGNOSIS — M8580 Other specified disorders of bone density and structure, unspecified site: Secondary | ICD-10-CM | POA: Diagnosis not present

## 2024-07-08 DIAGNOSIS — E782 Mixed hyperlipidemia: Secondary | ICD-10-CM | POA: Diagnosis not present

## 2024-07-08 DIAGNOSIS — Z0000 Encounter for general adult medical examination without abnormal findings: Secondary | ICD-10-CM | POA: Diagnosis not present

## 2024-07-08 DIAGNOSIS — Z23 Encounter for immunization: Secondary | ICD-10-CM | POA: Diagnosis not present

## 2024-08-16 ENCOUNTER — Ambulatory Visit: Payer: Self-pay | Admitting: Surgery

## 2024-09-29 ENCOUNTER — Ambulatory Visit (HOSPITAL_COMMUNITY): Admit: 2024-09-29 | Admitting: Surgery

## 2024-09-29 SURGERY — REPAIR, HERNIA, VENTRAL, ROBOT-ASSISTED
Anesthesia: General

## 2025-03-10 ENCOUNTER — Encounter (INDEPENDENT_AMBULATORY_CARE_PROVIDER_SITE_OTHER): Admitting: Ophthalmology

## 2025-03-17 ENCOUNTER — Encounter (INDEPENDENT_AMBULATORY_CARE_PROVIDER_SITE_OTHER): Admitting: Ophthalmology
